# Patient Record
Sex: Female | Born: 1950
Health system: Southern US, Community
[De-identification: ages and names within clinical notes are randomized; demographics above are authoritative.]

## PROBLEM LIST (undated history)

## (undated) DIAGNOSIS — E785 Hyperlipidemia, unspecified: Secondary | ICD-10-CM

## (undated) DIAGNOSIS — E010 Iodine-deficiency related diffuse (endemic) goiter: Secondary | ICD-10-CM

## (undated) DIAGNOSIS — I471 Supraventricular tachycardia, unspecified: Secondary | ICD-10-CM

## (undated) DIAGNOSIS — M1990 Unspecified osteoarthritis, unspecified site: Secondary | ICD-10-CM

## (undated) DIAGNOSIS — R112 Nausea with vomiting, unspecified: Secondary | ICD-10-CM

## (undated) DIAGNOSIS — R0602 Shortness of breath: Secondary | ICD-10-CM

## (undated) DIAGNOSIS — K5190 Ulcerative colitis, unspecified, without complications: Secondary | ICD-10-CM

## (undated) DIAGNOSIS — T884XXA Failed or difficult intubation, initial encounter: Secondary | ICD-10-CM

## (undated) DIAGNOSIS — R0789 Other chest pain: Secondary | ICD-10-CM

## (undated) DIAGNOSIS — Z9889 Other specified postprocedural states: Secondary | ICD-10-CM

## (undated) DIAGNOSIS — M797 Fibromyalgia: Secondary | ICD-10-CM

## (undated) DIAGNOSIS — E119 Type 2 diabetes mellitus without complications: Secondary | ICD-10-CM

## (undated) DIAGNOSIS — G43909 Migraine, unspecified, not intractable, without status migrainosus: Secondary | ICD-10-CM

## (undated) DIAGNOSIS — I10 Essential (primary) hypertension: Secondary | ICD-10-CM

## (undated) DIAGNOSIS — F329 Major depressive disorder, single episode, unspecified: Secondary | ICD-10-CM

## (undated) DIAGNOSIS — F32A Depression, unspecified: Secondary | ICD-10-CM

## (undated) DIAGNOSIS — I499 Cardiac arrhythmia, unspecified: Secondary | ICD-10-CM

## (undated) DIAGNOSIS — E079 Disorder of thyroid, unspecified: Secondary | ICD-10-CM

## (undated) DIAGNOSIS — F419 Anxiety disorder, unspecified: Secondary | ICD-10-CM

## (undated) DIAGNOSIS — E049 Nontoxic goiter, unspecified: Secondary | ICD-10-CM

## (undated) HISTORY — DX: Nontoxic goiter, unspecified: E04.9

## (undated) HISTORY — PX: APPENDECTOMY: SHX54

## (undated) HISTORY — DX: Depression, unspecified: F32.A

## (undated) HISTORY — DX: Type 2 diabetes mellitus without complications: E11.9

## (undated) HISTORY — PX: ACHILLES TENDON REPAIR: SUR1153

## (undated) HISTORY — DX: Major depressive disorder, single episode, unspecified: F32.9

## (undated) HISTORY — DX: Other chest pain: R07.89

## (undated) HISTORY — DX: Unspecified osteoarthritis, unspecified site: M19.90

## (undated) HISTORY — PX: NOSE SURGERY: SHX723

## (undated) HISTORY — PX: FOOT SURGERY: SHX648

## (undated) HISTORY — DX: Supraventricular tachycardia, unspecified: I47.10

## (undated) HISTORY — DX: Shortness of breath: R06.02

## (undated) HISTORY — PX: TOTAL KNEE ARTHROPLASTY: SHX125

## (undated) HISTORY — PX: JOINT REPLACEMENT: SHX530

## (undated) HISTORY — PX: CHOLECYSTECTOMY: SHX55

## (undated) HISTORY — DX: Essential (primary) hypertension: I10

## (undated) HISTORY — PX: TONSILLECTOMY: SUR1361

## (undated) HISTORY — DX: Migraine, unspecified, not intractable, without status migrainosus: G43.909

## (undated) HISTORY — DX: Ulcerative colitis, unspecified, without complications: K51.90

## (undated) HISTORY — DX: Disorder of thyroid, unspecified: E07.9

## (undated) HISTORY — PX: ABDOMINAL HYSTERECTOMY: SHX81

## (undated) HISTORY — DX: Supraventricular tachycardia: I47.1

## (undated) HISTORY — DX: Hyperlipidemia, unspecified: E78.5

---

## 1997-11-13 ENCOUNTER — Other Ambulatory Visit: Admission: RE | Admit: 1997-11-13 | Discharge: 1997-11-13 | Payer: Self-pay | Admitting: Family Medicine

## 1997-11-15 ENCOUNTER — Other Ambulatory Visit: Admission: RE | Admit: 1997-11-15 | Discharge: 1997-11-15 | Payer: Self-pay | Admitting: Family Medicine

## 1997-11-17 ENCOUNTER — Other Ambulatory Visit: Admission: RE | Admit: 1997-11-17 | Discharge: 1997-11-17 | Payer: Self-pay | Admitting: Family Medicine

## 1997-11-29 ENCOUNTER — Other Ambulatory Visit: Admission: RE | Admit: 1997-11-29 | Discharge: 1997-11-29 | Payer: Self-pay | Admitting: Obstetrics & Gynecology

## 1998-01-17 ENCOUNTER — Ambulatory Visit (HOSPITAL_COMMUNITY): Admission: RE | Admit: 1998-01-17 | Discharge: 1998-01-17 | Payer: Self-pay | Admitting: Family Medicine

## 1998-03-14 ENCOUNTER — Ambulatory Visit (HOSPITAL_COMMUNITY): Admission: RE | Admit: 1998-03-14 | Discharge: 1998-03-14 | Payer: Self-pay | Admitting: Urology

## 1998-03-15 ENCOUNTER — Ambulatory Visit (HOSPITAL_COMMUNITY): Admission: RE | Admit: 1998-03-15 | Discharge: 1998-03-15 | Payer: Self-pay | Admitting: Urology

## 1998-09-04 ENCOUNTER — Encounter: Payer: Self-pay | Admitting: Urology

## 1998-09-10 ENCOUNTER — Ambulatory Visit (HOSPITAL_COMMUNITY): Admission: RE | Admit: 1998-09-10 | Discharge: 1998-09-10 | Payer: Self-pay | Admitting: Urology

## 1999-02-24 ENCOUNTER — Other Ambulatory Visit: Admission: RE | Admit: 1999-02-24 | Discharge: 1999-02-24 | Payer: Self-pay | Admitting: Obstetrics and Gynecology

## 1999-03-11 ENCOUNTER — Encounter: Payer: Self-pay | Admitting: Urology

## 1999-03-14 ENCOUNTER — Ambulatory Visit (HOSPITAL_COMMUNITY): Admission: RE | Admit: 1999-03-14 | Discharge: 1999-03-14 | Payer: Self-pay | Admitting: Urology

## 1999-10-03 ENCOUNTER — Ambulatory Visit (HOSPITAL_COMMUNITY): Admission: RE | Admit: 1999-10-03 | Discharge: 1999-10-03 | Payer: Self-pay | Admitting: Urology

## 1999-11-22 ENCOUNTER — Emergency Department (HOSPITAL_COMMUNITY): Admission: EM | Admit: 1999-11-22 | Discharge: 1999-11-22 | Payer: Self-pay | Admitting: Emergency Medicine

## 2000-02-11 HISTORY — PX: DOPPLER ECHOCARDIOGRAPHY: SHX263

## 2001-04-22 ENCOUNTER — Other Ambulatory Visit: Admission: RE | Admit: 2001-04-22 | Discharge: 2001-04-22 | Payer: Self-pay | Admitting: Obstetrics and Gynecology

## 2001-09-23 ENCOUNTER — Emergency Department (HOSPITAL_COMMUNITY): Admission: EM | Admit: 2001-09-23 | Discharge: 2001-09-23 | Payer: Self-pay | Admitting: Emergency Medicine

## 2001-09-23 ENCOUNTER — Encounter: Payer: Self-pay | Admitting: Emergency Medicine

## 2002-04-25 ENCOUNTER — Other Ambulatory Visit: Admission: RE | Admit: 2002-04-25 | Discharge: 2002-04-25 | Payer: Self-pay | Admitting: Obstetrics and Gynecology

## 2003-07-17 ENCOUNTER — Ambulatory Visit (HOSPITAL_COMMUNITY): Admission: RE | Admit: 2003-07-17 | Discharge: 2003-07-17 | Payer: Self-pay | Admitting: Urology

## 2003-07-17 ENCOUNTER — Ambulatory Visit (HOSPITAL_BASED_OUTPATIENT_CLINIC_OR_DEPARTMENT_OTHER): Admission: RE | Admit: 2003-07-17 | Discharge: 2003-07-17 | Payer: Self-pay | Admitting: Urology

## 2003-11-05 HISTORY — PX: CARDIOVASCULAR STRESS TEST: SHX262

## 2004-03-28 ENCOUNTER — Ambulatory Visit (HOSPITAL_COMMUNITY): Admission: RE | Admit: 2004-03-28 | Discharge: 2004-03-28 | Payer: Self-pay | Admitting: Urology

## 2004-03-28 ENCOUNTER — Ambulatory Visit (HOSPITAL_BASED_OUTPATIENT_CLINIC_OR_DEPARTMENT_OTHER): Admission: RE | Admit: 2004-03-28 | Discharge: 2004-03-28 | Payer: Self-pay | Admitting: Urology

## 2004-04-11 ENCOUNTER — Ambulatory Visit (HOSPITAL_BASED_OUTPATIENT_CLINIC_OR_DEPARTMENT_OTHER): Admission: RE | Admit: 2004-04-11 | Discharge: 2004-04-11 | Payer: Self-pay | Admitting: Urology

## 2004-04-11 ENCOUNTER — Ambulatory Visit (HOSPITAL_COMMUNITY): Admission: RE | Admit: 2004-04-11 | Discharge: 2004-04-11 | Payer: Self-pay | Admitting: Urology

## 2004-06-05 ENCOUNTER — Emergency Department (HOSPITAL_COMMUNITY): Admission: EM | Admit: 2004-06-05 | Discharge: 2004-06-05 | Payer: Self-pay | Admitting: Emergency Medicine

## 2004-08-08 ENCOUNTER — Ambulatory Visit (HOSPITAL_BASED_OUTPATIENT_CLINIC_OR_DEPARTMENT_OTHER): Admission: RE | Admit: 2004-08-08 | Discharge: 2004-08-08 | Payer: Self-pay | Admitting: Urology

## 2004-08-08 ENCOUNTER — Ambulatory Visit (HOSPITAL_COMMUNITY): Admission: RE | Admit: 2004-08-08 | Discharge: 2004-08-08 | Payer: Self-pay | Admitting: Urology

## 2008-02-13 ENCOUNTER — Ambulatory Visit: Payer: Self-pay | Admitting: Gastroenterology

## 2008-02-24 ENCOUNTER — Ambulatory Visit: Payer: Self-pay | Admitting: Gastroenterology

## 2008-02-24 ENCOUNTER — Encounter: Payer: Self-pay | Admitting: Gastroenterology

## 2008-02-28 ENCOUNTER — Encounter: Payer: Self-pay | Admitting: Gastroenterology

## 2008-03-26 DIAGNOSIS — N3090 Cystitis, unspecified without hematuria: Secondary | ICD-10-CM | POA: Insufficient documentation

## 2008-03-26 DIAGNOSIS — I471 Supraventricular tachycardia, unspecified: Secondary | ICD-10-CM | POA: Insufficient documentation

## 2008-03-26 DIAGNOSIS — E785 Hyperlipidemia, unspecified: Secondary | ICD-10-CM | POA: Insufficient documentation

## 2008-03-26 DIAGNOSIS — M1990 Unspecified osteoarthritis, unspecified site: Secondary | ICD-10-CM | POA: Insufficient documentation

## 2008-03-26 DIAGNOSIS — K921 Melena: Secondary | ICD-10-CM | POA: Insufficient documentation

## 2008-03-26 DIAGNOSIS — K5180 Other ulcerative colitis without complications: Secondary | ICD-10-CM | POA: Insufficient documentation

## 2008-03-26 DIAGNOSIS — R197 Diarrhea, unspecified: Secondary | ICD-10-CM | POA: Insufficient documentation

## 2008-03-27 ENCOUNTER — Ambulatory Visit: Payer: Self-pay | Admitting: Gastroenterology

## 2008-03-27 DIAGNOSIS — IMO0001 Reserved for inherently not codable concepts without codable children: Secondary | ICD-10-CM | POA: Insufficient documentation

## 2008-03-27 DIAGNOSIS — Z8719 Personal history of other diseases of the digestive system: Secondary | ICD-10-CM | POA: Insufficient documentation

## 2008-03-27 DIAGNOSIS — N3010 Interstitial cystitis (chronic) without hematuria: Secondary | ICD-10-CM | POA: Insufficient documentation

## 2008-03-27 LAB — CONVERTED CEMR LAB
ALT: 30 units/L (ref 0–35)
AST: 25 units/L (ref 0–37)
Albumin: 4.1 g/dL (ref 3.5–5.2)
Alkaline Phosphatase: 59 units/L (ref 39–117)
BUN: 21 mg/dL (ref 6–23)
Basophils Absolute: 0 10*3/uL (ref 0.0–0.1)
Basophils Relative: 1 % (ref 0.0–3.0)
Bilirubin, Direct: 0.1 mg/dL (ref 0.0–0.3)
CO2: 34 meq/L — ABNORMAL HIGH (ref 19–32)
Calcium: 9.6 mg/dL (ref 8.4–10.5)
Chloride: 106 meq/L (ref 96–112)
Creatinine, Ser: 1.1 mg/dL (ref 0.4–1.2)
Eosinophils Absolute: 0.2 10*3/uL (ref 0.0–0.7)
Eosinophils Relative: 3.3 % (ref 0.0–5.0)
Ferritin: 60.5 ng/mL (ref 10.0–291.0)
Folate: 15.7 ng/mL
GFR calc Af Amer: 66 mL/min
GFR calc non Af Amer: 55 mL/min
Glucose, Bld: 105 mg/dL — ABNORMAL HIGH (ref 70–99)
HCT: 41.6 % (ref 36.0–46.0)
Hemoglobin: 14.1 g/dL (ref 12.0–15.0)
Iron: 95 ug/dL (ref 42–145)
Lymphocytes Relative: 35.7 % (ref 12.0–46.0)
MCHC: 34 g/dL (ref 30.0–36.0)
MCV: 94.3 fL (ref 78.0–100.0)
Monocytes Absolute: 0.5 10*3/uL (ref 0.1–1.0)
Monocytes Relative: 9.7 % (ref 3.0–12.0)
Neutro Abs: 2.3 10*3/uL (ref 1.4–7.7)
Neutrophils Relative %: 50.3 % (ref 43.0–77.0)
Platelets: 284 10*3/uL (ref 150–400)
Potassium: 4.2 meq/L (ref 3.5–5.1)
RBC: 4.41 M/uL (ref 3.87–5.11)
RDW: 13 % (ref 11.5–14.6)
Rhuematoid fact SerPl-aCnc: <20 intl units/mL — ABNORMAL LOW (ref 0.0–20.0)
Saturation Ratios: 23.7 % (ref 20.0–50.0)
Sed Rate: 16 mm/hr (ref 0–22)
Sodium: 143 meq/L (ref 135–145)
TSH: 0.79 microintl units/mL (ref 0.35–5.50)
Tissue Transglutaminase Ab, IgA: 0.2 units (ref <7)
Total Bilirubin: 0.9 mg/dL (ref 0.3–1.2)
Total Protein: 7 g/dL (ref 6.0–8.3)
Transferrin: 286.1 mg/dL (ref >=212.0)
Vit D, 1,25-Dihydroxy: 44 (ref 30–89)
Vitamin B-12: 533 pg/mL (ref 211–911)
WBC: 4.7 10*3/uL (ref 4.5–10.5)

## 2008-03-29 ENCOUNTER — Encounter: Payer: Self-pay | Admitting: Gastroenterology

## 2008-04-06 ENCOUNTER — Telehealth: Payer: Self-pay | Admitting: Gastroenterology

## 2008-06-01 ENCOUNTER — Ambulatory Visit: Payer: Self-pay | Admitting: Gastroenterology

## 2008-08-14 ENCOUNTER — Ambulatory Visit: Payer: Self-pay | Admitting: Gastroenterology

## 2009-04-15 ENCOUNTER — Telehealth: Payer: Self-pay | Admitting: Gastroenterology

## 2009-05-06 ENCOUNTER — Encounter: Payer: Self-pay | Admitting: Gastroenterology

## 2009-08-21 ENCOUNTER — Encounter: Admission: RE | Admit: 2009-08-21 | Discharge: 2009-08-21 | Payer: Self-pay | Admitting: Neurology

## 2010-04-24 ENCOUNTER — Ambulatory Visit (HOSPITAL_BASED_OUTPATIENT_CLINIC_OR_DEPARTMENT_OTHER): Admission: RE | Admit: 2010-04-24 | Discharge: 2010-04-24 | Payer: Self-pay | Admitting: *Deleted

## 2010-08-14 NOTE — Progress Notes (Signed)
Summary: results  Phone Note Call from Patient Call back at [phone]   Caller: Patient Call For: patterson Reason for Call: Lab or Test Results Summary of Call: lab results from two wks ago. Initial call taken by: Tawni Levy,  April 06, 2008 10:57 AM  Follow-up for Phone Call        pt advised all wnl and she wanted a copy faxed to her, done Follow-up by: Harlow Mares CMA,  April 06, 2008 11:05 AM

## 2010-08-14 NOTE — Assessment & Plan Note (Signed)
Summary: followup after colonoscopy/ulcerative colitis/tcw   History of Present Illness Visit Type: new patient Primary GI MD: Sheryn Bison MD FACP FAGA Primary Provider: Durwin Nora, MD Chief Complaint: Patient here for f/u after colonoscopy.  Patient c/o continued generalized abdominal craming as well as some rectal bleeding with wiping.  Patient states that her bowel movements are always very urgent. History of Present Illness:   [name] was referred for direct screening colonoscopy and had changes of ulcerative colitis and a cecal ulceration. Biopsies suggested possible NSAID damage and her Lodine that she takes for degenerative arthritis in her knees was stopped and she was placed on oral and in a salicylate therapy. Her underlying diarrhea and rectal bleeding of the last 6 months has improved dramatically but she continues with tenesmus and some bright red blood per rectum.  She has an inflammatory arthritis of her knees and tendinitis in her right foot is followed by Dr. Eulah Pont in orthopedics. She's had chronic arthritis problems and I not sure the extent of her arthritis workup. She been intolerant to almost all incidents. In spite of this fact, she denies upper gastrointestinal or hepatobiliary complaints. She does have general malaise he carries a diagnosis of fibromyalgia, interstitial cystitis, migraine headaches, peripheral edema, and possible osteoporosis. She is all right medications including Aldactone, Toprol-XL, Lyrica, Elmiron, Maxalt, tramadol, et Karie Soda.   GI Review of Systems      Denies abdominal pain, acid reflux, belching, bloating, chest pain, dysphagia with liquids, dysphagia with solids, heartburn, loss of appetite, nausea, vomiting, vomiting blood, weight loss, and  weight gain.      Reports diarrhea, hemorrhoids, and  rectal bleeding.     Denies anal fissure, black tarry stools, change in bowel habit, constipation, diverticulosis, fecal incontinence, heme positive  stool, irritable bowel syndrome, jaundice, light color stool, liver problems, and  rectal pain.     Prior Medications Reviewed Using: List Brought by Patient  Updated Prior Medication List: LIALDA 1.2 GM  TBEC (MESALAMINE) take two every a.m. ALDACTONE 50 MG TABS (SPIRONOLACTONE) Take 2 tablets by mouth qam TOPROL XL 50 MG XR24H-TAB (METOPROLOL SUCCINATE) Take 1 tablet by mouth once a day ZETIA 10 MG TABS (EZETIMIBE) Take 1 tablet by mouth once daily LYRICA 75 MG CAPS (PREGABALIN) Take 1 tablet by mouth two times a day ELMIRON 100 MG CAPS (PENTOSAN POLYSULFATE SODIUM) Take 2 tablets by mouth qam MAXALT 10 MG TABS (RIZATRIPTAN BENZOATE) Take as needed for headache TRAMADOL HCL 50 MG TABS (TRAMADOL HCL) Take as needed for pain MULTIVITAMINS  TABS (MULTIPLE VITAMIN) Take 1 tablet by mouth once a day GREEN TEA SLIM  TABS (APPLE CID VN-GRN TEA-BIT OR-CR) Take 2 tablets by mouth qam and 1 tablet qpm POMEGRANATE 250 MG CAPS (POMEGRANATE (PUNICA GRANATUM)) Take 1 tablet by mouth two times a day * TRANS-RESERVATROL 500 MG Take 1 tablet by mouth qam OMEGA-3 1000 MG CAPS (OMEGA-3 FATTY ACIDS) Take 1 tablet by mouth two times a day CALCIUM 500 + D 500-125 MG-UNIT TABS (CALCIUM CARBONATE-VITAMIN D) Take 2 tablets by mouth qam GLUCOSAMINE-CHONDROITIN 500-400 MG CAPS (GLUCOSAMINE-CHONDROITIN) TAke 1 tablet by mouth with each meal  Current Allergies (reviewed today): ! BIAXIN ! AMOXICILLIN ! DOXYCYCLINE ! PENICILLIN  Past Medical History:    Reviewed history from 03/26/2008 and no changes required:       Current Problems:        CARDIAC ARRHYTHMIA (ICD-427.9)       CYSTITIS (ICD-595.9)       OSTEOARTHRITIS (ICD-715.90)  HYPERLIPIDEMIA (ICD-272.4)       HEMATOCHEZIA (ICD-578.1)       DIARRHEA (ICD-787.91)       OTHER ULCERATIVE COLITIS (ICD-556.8)         Past Surgical History:    Reviewed history and no changes required:       Cholecystectomy       Achilles Tendon repair       Left  shoulder surgery       Interstem implant-left hip       hysterectomy-partial       nose surgery   Family History:    Reviewed history and no changes required:       Family History of Breast Cancer: Paternal Aunt       No FH of Colon Cancer:       Family History of Diabetes: Paternal Grandmother, Mother, Maternal Aunt x 2       Family History of Heart Disease: Paternal Grandmother, Paternal Grandfather, Maternal Grandmother, Mother  Social History:    Reviewed history and no changes required:       Occupation: Airline pilot       Patient has never smoked.        Alcohol Use - yes       Daily Caffeine Use       Illicit Drug Use - no       Patient gets regular exercise.   Risk Factors:  Tobacco use:  never Drug use:  no Caffeine use:  3 drinks per day Alcohol use:  yes    Type:  margarita    Drinks per day:  <1    Comments:  drinks rarely Exercise:  yes    Times per week:  6    Type:  weights,cardio   Review of Systems       The patient complains of arthritis/joint pain and fatigue.  The patient denies allergy/sinus, anemia, anxiety-new, back pain, blood in urine, breast changes/lumps, change in vision, confusion, cough, coughing up blood, depression-new, fainting, fever, headaches-new, hearing problems, heart murmur, heart rhythm changes, itching, menstrual pain, muscle pains/cramps, night sweats, nosebleeds, pregnancy symptoms, shortness of breath, skin rash, sleeping problems, sore throat, swelling of feet/legs, swollen lymph glands, thirst - excessive , urination - excessive , urination changes/pain, urine leakage, vision changes, and voice change.     Vital Signs:  Patient Profile:   60 Years Old Female Height:     67 inches Weight:      196.2 pounds BMI:     30.84 BSA:     2.01 Pulse rate:   84 / minute Pulse rhythm:   regular BP sitting:   114 / 68  (right arm)  Vitals Entered By: Hortense Ramal CMA (March 27, 2008 8:41 AM)                  Physical  Exam  General:     Well developed, well nourished, no acute distress.healthy appearing.   Head:     Normocephalic and atraumatic. Eyes:     PERRLA, no icterus.exam deferred to patient's ophthalmologist.   Lungs:     Clear throughout to auscultation. Heart:     Regular rate and rhythm; no murmurs, rubs,  or bruits. Abdomen:     Soft, nontender and nondistended. No masses, hepatosplenomegaly or hernias noted. Normal bowel sounds. Msk:     joint tenderness and joint warmth.  She has bony thickening and decreased range of motion of both knees and has a soft  cast on her left leg. I cannot appreciate any evidence of phlebitis. There is no evidence of swelling or pain and tenderness in other joints that I can appreciate. Extremities:     No clubbing, cyanosis, edema or deformities noted. Neurologic:     Alert and  oriented x4;  grossly normal neurologically. Skin:     Intact without significant lesions or rashes. Psych:     Alert and cooperative. Normal mood and affect.depressed affect.      Impression & Recommendations:  Problem # 1:  OTHER ULCERATIVE COLITIS (ICD-556.8) Assessment: Improved Her inflammatory colitis has improved him in a salicylate therapy. She continues with some tenesmus and I have prescribed Canasa 1 g suppositories at bedtime. Inflammatory bowel disease serologic markers have been ordered and we will try to decide whether she has primary inflammatory bowel disease versus changes from NSAID damage. She is to continue Lialba 2.4 g a day. Orders: IBD Serology ([facility] #1007) [phone]) T-Vitamin D (25-Hydroxy) [phone]) T-Sprue Panel (Celiac Disease Gustavus Bryant) [phone]) TLB-CBC Platelet - w/Differential (85025-CBCD) TLB-BMP (Basic Metabolic Panel-BMET) (80048-METABOL) TLB-Hepatic/Liver Function Pnl (80076-HEPATIC) TLB-TSH (Thyroid Stimulating Hormone) (84443-TSH) TLB-B12, Serum-Total ONLY (08657-Q46) TLB-Ferritin (82728-FER) TLB-Folic Acid (Folate)  (82746-FOL) TLB-IBC Pnl (Iron/FE;Transferrin) (83550-IBC) TLB-Sedimentation Rate (ESR) (85651-ESR) TLB-Rheumatoid Factor (RA) (96295-MW)   Problem # 2:  DIARRHEA (ICD-787.91) Assessment: Improved Celiac antibodies have been ordered along with routine screening parameters, sed rate, and anemia profile. She gives a history of lactose intolerance and uses Lactaid tablets.  Problem # 3:  OSTEOARTHRITIS (ICD-715.90) Assessment: Deteriorated Rheumatoid factor and sedimentation rate have been ordered. Started possibly she has inflammatory arthropathy associated with inflammatory bowel disease. I placed her on Celebrex 200 mg twice a day office followup in 2 weeks' time. She has a variety of functional disorders it may all be that she has chronic IBS exacerbated by NSAID use. She does seem to have significant arthritis and she may need referral for rheumatology evaluation.  Problem # 4:  FIBROMYALGIA (ICD-729.1) Assessment: Unchanged Consider low-dose antidepressant use.   Patient Instructions: 1)  Copy Sent To:Dr. Durwin Nora and Dr. Eulah Pont orthopedics. 2)  Please Continue current medications. 3)  Lactose Intolerance brochure given. 4)  Please schedule a follow-up appointment in 2 weeks 5)  Canasa 1 g suppositories at bedtime and Celebrex 200 mg twice a day.    Prescriptions: CELEBREX 200 MG CAPS (CELECOXIB) one by mouth two times a day  #60 x 3   Entered by:   Harlow Mares CMA   Authorized by:   Mardella Layman MD FACG,FAGA   Signed by:   Harlow Mares CMA on 03/27/2008   Method used:   [facility]        [facility]  [place] [address]       [address]       [place], [place]  [address]       Ph: [phone] or [phone]       Fax: [phone]   RxID:   [id number] CANASA 1000 MG  SUPP (MESALAMINE) one per rectum at bedtime  #30 x 0   Entered by:   Harlow Mares CMA   Authorized by:   Mardella Layman MD FACG,FAGA   Signed by:   Harlow Mares CMA on  03/27/2008   Method used:   [facility]        [facility]  [place] [address] [phone][facility]ail[facility]       [address]  [place], [place]  [address]       Ph: [phone] or [phone]       Fax: [phone]   RxID:   [id number]  ]

## 2010-08-14 NOTE — Letter (Signed)
Summary: Patient Notice- Colon Biospy Results  [facility]  [address] [place], [place] [address]   Phone: [phone]  Fax: [phone]        February 28, 2008 MRN: [id number]    [name] [address] [place], [place]  [address]    Dear [name],  I am pleased to inform you that the biopsies taken during your recent colonoscopy did not show any evidence of cancer upon pathologic examination.Biopsies show NSAID damage only....DRP  Additional information/recommendations:  __No further action is needed at this time.  Please follow-up with      your primary care physician for your other healthcare needs.  __Please call [phone] to schedule a return visit to review      your condition.  xx__Continue with the treatment plan as outlined on the day of your      exam.  __You should have a repeat colonoscopy examination for this problem           in _ years.  Please call us if you are having persistent problems or have questions about your condition that have not been fully answered at this time.  Sincerely,  Mardella Layman MD [facility]  This letter has been electronically signed by your physician.

## 2010-08-14 NOTE — Assessment & Plan Note (Signed)
Summary: FUP/YF   History of Present Illness Visit Type: follow up Primary GI MD: Sheryn Bison MD Faith Rogue Primary Provider: Durwin Nora, MD Requesting Provider: n/a Chief Complaint: Follow-up visit from Colonoscopy and Labs colon was done in Aug 09 and labs in September History of Present Illness:   [name] is greatly improved symptomatically has been longer having cramping, diarrhea, rectal bleeding. Since she was last seen she said injection of her knees by her orthopedist and this plus her daily Celebrex has caused marked relief of her arthritis. Of her lab tests were unremarkable including rheumatoid factor inflammatory bowel disease markers. She takes Lialba 2.4 g a day with p.r.n. Canasa suppositories.   GI Review of Systems      Denies abdominal pain, acid reflux, belching, bloating, chest pain, dysphagia with liquids, dysphagia with solids, heartburn, loss of appetite, nausea, vomiting, vomiting blood, weight loss, and  weight gain.        Denies anal fissure, black tarry stools, change in bowel habit, constipation, diarrhea, diverticulosis, fecal incontinence, heme positive stool, hemorrhoids, irritable bowel syndrome, jaundice, light color stool, liver problems, rectal bleeding, and  rectal pain.     Prior Medications Reviewed Using: Patient Recall  Prior Medication List:  LIALDA 1.2 GM  TBEC (MESALAMINE) take two every a.m. ALDACTONE 50 MG TABS (SPIRONOLACTONE) Take 2 tablets by mouth qam TOPROL XL 50 MG XR24H-TAB (METOPROLOL SUCCINATE) Take 1 tablet by mouth once a day ZETIA 10 MG TABS (EZETIMIBE) Take 1 tablet by mouth once daily LYRICA 75 MG CAPS (PREGABALIN) Take 1 tablet by mouth two times a day ELMIRON 100 MG CAPS (PENTOSAN POLYSULFATE SODIUM) Take 2 tablets by mouth qam MAXALT 10 MG TABS (RIZATRIPTAN BENZOATE) Take as needed for headache TRAMADOL HCL 50 MG TABS (TRAMADOL HCL) Take as needed for pain MULTIVITAMINS  TABS (MULTIPLE VITAMIN) Take 1 tablet by mouth  once a day GREEN TEA SLIM  TABS (APPLE CID VN-GRN TEA-BIT OR-CR) Take 2 tablets by mouth qam and 1 tablet qpm POMEGRANATE 250 MG CAPS (POMEGRANATE (PUNICA GRANATUM)) Take 1 tablet by mouth two times a day * TRANS-RESERVATROL 500 MG Take 1 tablet by mouth qam OMEGA-3 1000 MG CAPS (OMEGA-3 FATTY ACIDS) Take 1 tablet by mouth two times a day CALCIUM 500 + D 500-125 MG-UNIT TABS (CALCIUM CARBONATE-VITAMIN D) Take 2 tablets by mouth qam GLUCOSAMINE-CHONDROITIN 500-400 MG CAPS (GLUCOSAMINE-CHONDROITIN) TAke 1 tablet by mouth with each meal CANASA 1000 MG  SUPP (MESALAMINE) one per rectum at bedtime CELEBREX 200 MG CAPS (CELECOXIB) one by mouth two times a day   Current Allergies (reviewed today): ! BIAXIN ! AMOXICILLIN ! DOXYCYCLINE ! PENICILLIN  Past Medical History:    Reviewed history from 03/26/2008 and no changes required:       Current Problems:        CARDIAC ARRHYTHMIA (ICD-427.9)       CYSTITIS (ICD-595.9)       OSTEOARTHRITIS (ICD-715.90)       HYPERLIPIDEMIA (ICD-272.4)       HEMATOCHEZIA (ICD-578.1)       DIARRHEA (ICD-787.91)       OTHER ULCERATIVE COLITIS (ICD-556.8)         Past Surgical History:    Reviewed history from 03/27/2008 and no changes required:       Cholecystectomy       Achilles Tendon repair       Left shoulder surgery       Interstem implant-left hip       hysterectomy-partial  nose surgery   Family History:    Reviewed history from 03/27/2008 and no changes required:       Family History of Breast Cancer: Paternal Aunt       No FH of Colon Cancer:       Family History of Diabetes: Paternal Grandmother, Mother, Maternal Aunt x 2       Family History of Heart Disease: Paternal Grandmother, Paternal Grandfather, Maternal Grandmother, Mother  Social History:    Reviewed history from 03/27/2008 and no changes required:       Occupation: Airline pilot       Patient has never smoked.        Alcohol Use - yes occ       Daily Caffeine Use        Illicit Drug Use - no       Patient gets regular exercise.     Vital Signs:  Patient Profile:   60 Years Old Female Height:     67 inches Weight:      192.50 pounds BMI:     30.26 Pulse rate:   64 / minute Pulse rhythm:   regular BP sitting:   120 / 80  (left arm)  Vitals Entered By: Merri Ray CMA (June 01, 2008 10:37 AM)                  Physical Exam  General:     Well developed, well nourished, no acute distress.healthy appearing.      Impression & Recommendations:  Problem # 1:  OTHER ULCERATIVE COLITIS (ICD-556.8) Assessment: Improved her IBD serologies were negative it appears she probably had an NSAID-induced inflammatory colitis which is resolving on aminosalicylate therapy and removal of Cox one inhibitors. She still has minimal colitis symptoms they'll continue her medications as previously outlined with followup in 3 months time. At her request I gave her copy of all her labs.   Patient Instructions: 1)  Please Continue current medications. 2)  Please schedule a follow-up appointment in 3 months.    ]

## 2010-08-14 NOTE — Miscellaneous (Signed)
Summary: llialda rx  Clinical Lists Changes  Medications: Added new medication of LIALDA 1.2 GM  TBEC (MESALAMINE) take two every a.m. - Signed Rx of LIALDA 1.2 GM  TBEC (MESALAMINE) take two every a.m.;  #62 x 3;  Signed;  Entered by: Greer Ee RN;  Authorized by: Mardella Layman MD FACG,FAGA;  Method used: Electronic    Prescriptions: LIALDA 1.2 GM  TBEC (MESALAMINE) take two every a.m.  #62 x 3   Entered by:   Greer Ee RN   Authorized by:   Mardella Layman MD [facility]   Signed by:   Greer Ee RN on 02/24/2008   Method used:   Electronically [facility] to ...       [facility]  [place] [address]       [address]       [place], [place]  [address]       Ph: [phone] or [phone]       Fax: [phone]   RxID:   [id number]

## 2010-08-14 NOTE — Miscellaneous (Signed)
Summary: LG/PV  Clinical Lists Changes  Medications: Added new medication of MOVIPREP 100 GM  SOLR (PEG-KCL-NACL-NASULF-NA ASC-C) As per prep instructions. - Signed Rx of MOVIPREP 100 GM  SOLR (PEG-KCL-NACL-NASULF-NA ASC-C) As per prep instructions.;  #1 x 0;  Signed;  Entered by: Ezra Sites RN;  Authorized by: Mardella Layman MD [facility];  Method used: Electronic Allergies: Added new allergy or adverse reaction of BIAXIN Added new allergy or adverse reaction of AMOXICILLIN Added new allergy or adverse reaction of DOXYCYCLINE Added new allergy or adverse reaction of PENICILLIN Observations: Added new observation of NKA: F (02/13/2008 16:45)    Prescriptions: MOVIPREP 100 GM  SOLR (PEG-KCL-NACL-NASULF-NA ASC-C) As per prep instructions.  #1 x 0   Entered by:   Ezra Sites RN   Authorized by:   Mardella Layman MD [facility]   Signed by:   Ezra Sites RN on 02/13/2008   Method used:   Electronically sent to ...       [facility]  [place] [address]       [address]       [place], [place]  [address]       Ph: [phone] or [phone]       Fax: [phone]   RxID:   [id number]

## 2010-08-14 NOTE — Assessment & Plan Note (Signed)
Summary: RECALL REV/FH   History of Present Illness Visit Type: follow up Primary GI MD: Sheryn Bison MD FACP FAGA Primary Provider: Durwin Nora, MD Requesting Provider: n/a Chief Complaint: colitis History of Present Illness:   Kayler is doing well with her left-sided ulcerative colitis and is having 2-3  bowel movements a day with occasional bright red blood per rectum. She denies abdominal pain or systemic complaints.She Takes Lialba 2.4 g a day and uses p.r.n. can also suppositories. Arthritis continues to be a problem despite taking Celebrex 2 mg twice a day. Inflammatory bowel disease serologic markers were obtained and were negative. She is on multiple medications listed and reviewed her chart. Her primary care physician Dr. Holley Bouche.   GI Review of Systems      Denies abdominal pain, acid reflux, belching, bloating, chest pain, dysphagia with liquids, dysphagia with solids, heartburn, loss of appetite, nausea, vomiting, vomiting blood, weight loss, and  weight gain.      Reports rectal pain.     Denies anal fissure, black tarry stools, change in bowel habit, constipation, diarrhea, diverticulosis, fecal incontinence, heme positive stool, hemorrhoids, irritable bowel syndrome, jaundice, light color stool, liver problems, and  rectal bleeding.   Prior Medications Reviewed Using: List Brought by Patient  Updated Prior Medication List: LIALDA 1.2 GM  TBEC (MESALAMINE) take two every a.m. ALDACTONE 50 MG TABS (SPIRONOLACTONE) Take 2 tablets by mouth qam TOPROL XL 50 MG XR24H-TAB (METOPROLOL SUCCINATE) Take 1 tablet by mouth once a day ZETIA 10 MG TABS (EZETIMIBE) Take 1 tablet by mouth once daily LYRICA 75 MG CAPS (PREGABALIN) Take 1 tablet by mouth two times a day ELMIRON 100 MG CAPS (PENTOSAN POLYSULFATE SODIUM) Take 2 tablets by mouth qam MAXALT 10 MG TABS (RIZATRIPTAN BENZOATE) Take as needed for headache TRAMADOL HCL 50 MG TABS (TRAMADOL HCL) Take as needed for  pain MULTIVITAMINS  TABS (MULTIPLE VITAMIN) Take 1 tablet by mouth once a day GREEN TEA SLIM  TABS (APPLE CID VN-GRN TEA-BIT OR-CR) Take 2 tablets by mouth qam and 1 tablet qpm POMEGRANATE 250 MG CAPS (POMEGRANATE (PUNICA GRANATUM)) Take 1 tablet by mouth two times a day * TRANS-RESERVATROL 500 MG Take 1 tablet by mouth qam OMEGA-3 1000 MG CAPS (OMEGA-3 FATTY ACIDS) Take 1 tablet by mouth two times a day CALCIUM 500 + D 500-125 MG-UNIT TABS (CALCIUM CARBONATE-VITAMIN D) Take 2 tablets by mouth qam GLUCOSAMINE-CHONDROITIN 500-400 MG CAPS (GLUCOSAMINE-CHONDROITIN) TAke 1 tablet by mouth with each meal CELEBREX 200 MG CAPS (CELECOXIB) one by mouth two times a day SAM-E COMPLETE 400 MG TBEC (S-ADENOSYLMETHIONINE) Take 2 daily  every morning ALIGN  CAPS (MISC INTESTINAL FLORA REGULAT) once daily  Current Allergies (reviewed today): ! BIAXIN ! AMOXICILLIN ! DOXYCYCLINE ! PENICILLIN Past Medical History:    Reviewed history from 03/26/2008 and no changes required:       Current Problems:        CARDIAC ARRHYTHMIA (ICD-427.9)       CYSTITIS (ICD-595.9)       OSTEOARTHRITIS (ICD-715.90)       HYPERLIPIDEMIA (ICD-272.4)       HEMATOCHEZIA (ICD-578.1)       DIARRHEA (ICD-787.91)       OTHER ULCERATIVE COLITIS (ICD-556.8)         Past Surgical History:    Reviewed history from 03/27/2008 and no changes required:       Cholecystectomy       Achilles Tendon repair       Left shoulder surgery  Interstem implant-left hip       hysterectomy-partial       nose surgery   Family History:    Reviewed history from 03/27/2008 and no changes required:       Family History of Breast Cancer: Paternal Aunt       No FH of Colon Cancer:       Family History of Diabetes: Paternal Grandmother, Mother, Maternal Aunt x 2       Family History of Heart Disease: Paternal Grandmother, Paternal Grandfather, Maternal Grandmother, Mother  Social History:    Reviewed history from 06/01/2008 and no changes  required:       Occupation: Airline pilot       Patient has never smoked.        Alcohol Use - yes occ       Daily Caffeine Use       Illicit Drug Use - no       Patient gets regular exercise.  Risk Factors:  Seatbelt use:  100 %  Vital Signs:  Patient Profile:   60 Years Old Female Height:     67 inches Weight:      197.25 pounds BMI:     31.01 Pulse rate:   68 / minute Pulse rhythm:   regular BP sitting:   120 / 80  (left arm)  Vitals Entered By: June McMurray CMA (August 14, 2008 3:50 PM)                  Physical Exam  General:     Well developed, well nourished, no acute distress.healthy appearing.   Head:     Normocephalic and atraumatic. Eyes:     PERRLA, no icterus.exam deferred to patient's ophthalmologist.   Abdomen:     Soft, nontender and nondistended. No masses, hepatosplenomegaly or hernias noted. Normal bowel sounds. Rectal:     Normal exam.External hemorrhoids are visible without fissures or fistulae. No rectal masses or tenderness and what stool is present is guaiac negative. Extremities:     No clubbing, cyanosis, edema or deformities noted. Neurologic:     Alert and  oriented x4;  grossly normal neurologically. Psych:     Alert and cooperative. Normal mood and affect.   Impression & Recommendations:  Problem # 1:  OTHER ULCERATIVE COLITIS (ICD-556.8) Assessment: Improved Continue the other 2.4 g a day p.r.n.Canasa 1 g suppositories.  Problem # 2:  HEMATOCHEZIA (ICD-578.1) Assessment: Unchanged this is hemorrhoidal bleeding and I prescribed p.r.n. Anal-pram cream locally.  Problem # 3:  FIBROMYALGIA (ICD-729.1) Assessment: Unchanged   Patient Instructions: 1)  Copy Sent To:Dr. Holley Bouche 2)  Please Continue current medications. 3)  Hemorrhoids brochure given. 4)  Please schedule a follow-up appointment in 4 months. 5)  Local anal hemorrhoid management  Appended Document: RECALL REV/FH    Clinical Lists  Changes  Medications: Added new medication of CANASA 1000 MG  SUPP (MESALAMINE) insert one into rectum at bedtime - Signed Added new medication of HYDROCORTISONE ACE-PRAMOXINE 2.5-1 % CREA (HYDROCORTISONE ACE-PRAMOXINE) Apply to rectum 2-3 times per day - Signed Rx of CANASA 1000 MG  SUPP (MESALAMINE) insert one into rectum at bedtime;  #30 x 0;  Signed;  Entered by: Harlow Mares CMA;  Authorized by: Mardella Layman MD [facility];  Method used: [facility]  [place] [address] [place], [place], [place]  [address], Ph: [phone] or [phone], Fax: [phone] Rx of HYDROCORTISONE ACE-PRAMOXINE 2.5-1 % CREA (HYDROCORTISONE ACE-PRAMOXINE) Apply  to rectum 2-3 times per day;  #1 tube x 0;  Signed;  Entered by: Harlow Mares CMA;  Authorized by: Mardella Layman MD [facility];  Method used: [facility]  [place] [address] [place], [place], [place]  [address], Ph: [phone] or [phone], Fax: [phone]    Prescriptions: HYDROCORTISONE ACE-PRAMOXINE 2.5-1 % CREA (HYDROCORTISONE ACE-PRAMOXINE) Apply to rectum 2-3 times per day  #1 tube x 0   Entered by:   Harlow Mares CMA   Authorized by:   Mardella Layman MD FACG,FAGA   Signed by:   Harlow Mares CMA on 08/14/2008   Method used:   [facility]  [place] [address])       [address]       [place], [place]  [address]       Ph: [phone] or [phone]       Fax: [phone]   RxID:   [id number] CANASA 1000 MG  SUPP (MESALAMINE) insert one into rectum at bedtime  #30 x 0   Entered by:   Harlow Mares CMA   Authorized by:   Mardella Layman MD FACG,FAGA   Signed by:   Harlow Mares CMA on 08/14/2008   Method used:   [facility]        [facility]  [place] [address] [facility]ail[facility]       [address]       [place], [place]  [address]       Ph: [phone] or [phone]       Fax: [phone]   RxID:   [id number]

## 2010-08-14 NOTE — Procedures (Signed)
Summary: Colonoscopy   Colonoscopy  Procedure date:  02/24/2008  Findings:      Location:  [facility].    Procedures Next Due Date:    Colonoscopy: 02/2018  Patient Name: [name], [name] MRN:  Procedure Procedures: Colonoscopy CPT: 16109.    with biopsy. CPT: Q5068410.  Personnel: Endoscopist: Vania Rea. Jarold Motto, MD.  Referred By: Huel Cote, MD.  Exam Location: Exam performed in Outpatient Clinic. Outpatient  Patient Consent: Procedure, Alternatives, Risks and Benefits discussed, consent obtained, from patient. Consent was obtained by the RN.  Indications Symptoms: Diarrhea Hematochezia.  History  Current Medications: Patient is not currently taking Coumadin.  Medical/ Surgical History: Hyperlipidemia, Osteoarthritis, Cystitis, Arrhythmia,  Pre-Exam Physical: Performed Feb 24, 2008. Cardio-pulmonary exam, Rectal exam, Abdominal exam, Extremity exam, Mental status exam WNL.  Comments: Pt. history reviewed/updated, physical exam performed prior to initiation of sedation? YES Exam Exam: Extent of exam reached: Terminal Ileum, extent intended: Cecum.  The cecum was identified by appendiceal orifice and IC valve. Patient position: on left side. Time to Cecum: 00:04:01. Time for Withdrawl: 00:08:29. Colon retroflexion performed. Images taken. ASA Classification: II. Tolerance: excellent.  Monitoring: Pulse and BP monitoring, Oximetry used. Supplemental O2 given. at 2 Liters.  Colon Prep Used Golytely for colon prep. Prep results: good.  Sedation Meds: Patient assessed and found to be appropriate for moderate (conscious) sedation. Fentanyl 75 mcg. given IV. Versed 10 mg. given IV.  Instrument(s): CF 140L. Serial [id number].  Findings - MUCOSAL ABNORMALITY: Descending Colon to Rectum. Erosions present, Pseudo polyps absent, Granularity present, bleeding present, ulcers absent, Friability: mild. Activity level mild, Biopsy/Mucosal Abn. taken.  ICD9: Ulcerative Colitis, Other: 556.8.  - SOLITARY ULCER: Cecum. Biopsy/Solitary Ulcer taken. ICD9: .  - NORMAL EXAM: Cecum to Descending Colon. Not Seen: Polyps. AVM's. Tumors. Diverticulosis.    Comments: NO POLYPS NOTED.... Assessment  Diagnoses: .  556.8: Ulcerative Colitis, Other. NSAID DAMAGE ****.   Events  Unplanned Interventions: No intervention was required.  Plans Medication Plan: Await pathology. 5-ASA: LIALDA 2.4GM QAM, starting Feb 24, 2008 for 4 wks.   Comments: STOP LODINE AND ALL NSAID'S Disposition: After procedure patient sent to recovery. After recovery patient sent home.  Scheduling/Referral: Follow-Up prn. Clinic Visit, to Vania Rea. Jarold Motto, MD, around Jan 24, 2008.  This report was created from the original endoscopy report, which was reviewed and signed by the above listed endoscopist.  REPORT OF SURGICAL PATHOLOGY   Case #: [id number] Patient Name: Rachael, Gomez Office Chart Number:  N/A   MRN: [id number] Pathologist: Alden Server A. Delila Spence, MD DOB/Age  Mar 03, 1951 (Age: 60)    Gender: F Date Taken:  02/24/2008 Date Received: 02/24/2008   FINAL DIAGNOSIS   ***MICROSCOPIC EXAMINATION AND DIAGNOSIS***   1.   COLON, CECUM, BIOPSIES:    -  FRAGMENTS OF COLONIC MUCOSA WITH ULCERATION, CRYPT ARCHITECTURE DISTORTION  AND ACTIVE MUCOSAL COLITIS   2.  COLON, SIGMOID, BIOPSIES:  ONE FRAGMENT OF ULCERATED MUCOSA WITH INFLAMED GRANULATION TISSUE AND FRAGMENTS OF BENIGN COLONIC MUCOSA WITH NO ACTIVE MUCOSAL INFLAMMATION, GRANULOMAS OR DYSPLASIA.      COMMENT 1,2.  Although the ulceration noted in both the biopsies may be related to NSAIDS related mucosal injury, the presence of crypt architectural distortion and branching of crypts with associated active cryptitis are features that are more often seen in Crohn' s disease.  No granulomas or dysplasia are identified.  Clinical and endoscopic correlation would be helpful.  (EAA:kv 02-27-08)     mw Date Reported:  02/27/2008     Alden Server A. Delila Spence, MD *** Electronically Signed Out By EAA ***   Clinical information Cecal ulcer possible.  NSAIDS damage vs Crohn' s disease (mj)   specimen(s) obtained 1: Colon, biopsy, cecum 2: Colon, biopsy, sigmoid   Gross Description 1.  Received in formalin are tan, soft tissue fragments that are submitted in toto.  Number:  Three            Size:  0.2 to 0.3 cm    2.  Received in formalin are four fragments of soft tan to focally dark red tissue that vary from 0.2 to 0.4 cm.  Block Summary:  Toto one cassette (BM:mj 02/24/08)   mj/     Signed by Mardella Layman MD FACG,FAGA on 02/28/2008 at 5:55 PM  ________________________________________________________________________ noted   Signed by Mardella Layman MD FACG,FAGA on 02/28/2008 at 5:55 PM  ________________________________________________________________________  cc:  Huel Cote, MD   February 28, 2008 MRN: [id number]    [name] [phone] [place], [place]  [address]    Dear [name],  I am pleased to inform you that the biopsies taken during your recent colonoscopy did not show any evidence of cancer upon pathologic examination.Biopsies show NSAID damage only....DRP  Additional information/recommendations:  __No further action is needed at this time.  Please follow-up with      your primary care physician for your other healthcare needs.  __Please call [phone] to schedule a return visit to review      your condition.  xx__Continue with the treatment plan as outlined on the day of your      exam.  __You should have a repeat colonoscopy examination for this problem           in _ years.  Please call us if you are having persistent problems or have questions about your condition that have not been fully answered at this time.  Sincerely,  Mardella Layman MD [facility]  This letter has been electronically signed by your  physician.   Signed by Mardella Layman MD FACG,FAGA on 02/28/2008 at 5:57 PM  ________________________________________________________________________

## 2010-08-14 NOTE — Progress Notes (Signed)
Summary: rectal bleeding  Phone Note Call from Patient Call back at [phone]   Caller: Patient Call For: Dr. Jarold Motto Reason for Call: Talk to Nurse Summary of Call: pt having blood in her stool even after switching to Celebrex per Dr. Norval Gable orders... pt's joints are hurting and she is still having gastro problems... should she switch meds again or see a rhumatoid specialist Initial call taken by: Vallarie Mare,  April 15, 2009 11:40 AM  Follow-up for Phone Call        CAN RESTART CELEBREX AND SCHEDULE WITH DR. Kellie Simmering ,,, Follow-up by: Mardella Layman MD [facility],  April 15, 2009 2:46 PM  Additional Follow-up for Phone Call Additional follow up Details #1::        Attempted to call Dr. Demetria Pore for referrral.  Office is closed whis week.  Left message for pt that it will be next week before we can make referral.  Ashok Cordia RN  April 16, 2009 10:38 AM    Additional Follow-up for Phone Call Additional follow up Details #2::    Appt sch with Dr. Kellie Simmering for 05/06/09.  Dr. Ines Bloomer office will notify pt.  Records faxed. Ashok Cordia RN  April 23, 2009 8:27 AM

## 2010-08-15 NOTE — Letter (Signed)
Summary: Pain all over body/Dr.William Nathaneil Canary, M.D.  Pain all over body/Dr.William Nathaneil Canary, M.D.   Imported By: Sherian Rein 05/29/2009 07:59:41  _____________________________________________________________________  External Attachment:    Type:   Image     Comment:   External Document

## 2010-09-25 LAB — POCT I-STAT 4, (NA,K, GLUC, HGB,HCT)
Glucose, Bld: 112 mg/dL — ABNORMAL HIGH (ref 70–99)
HCT: 41 % (ref 36.0–46.0)
Hemoglobin: 13.9 g/dL (ref 12.0–15.0)
Potassium: 4.3 mEq/L (ref 3.5–5.1)
Sodium: 141 mEq/L (ref 135–145)

## 2010-11-28 NOTE — Op Note (Signed)
NAME:  [name], [name]                       ACCOUNT NO.:  [id number]   MEDICAL RECORD NO.:  [id number]                   PATIENT TYPE:  AMB   LOCATION:  [facility]                                 FACILITY:  [facility]   PHYSICIAN:  Jamison Neighbor, M.D.               DATE OF BIRTH:  11/07/1950   DATE OF PROCEDURE:  07/17/2003  DATE OF DISCHARGE:                                 OPERATIVE REPORT   PREOPERATIVE DIAGNOSES:  1. Pelvic floor dysfunction.  2. Interstitial cystitis.   POSTOPERATIVE DIAGNOSES:  1. Pelvic floor dysfunction.  2. Interstitial cystitis.   OPERATION/PROCEDURE:  1. Cystoscopy.  2. Urethral calibration.  3. Hydrodistention of the bladder.  4. Marcaine and Pyridium instillation.  5. Marcaine and Kenalog injection.   SURGEON:  Jamison Neighbor, M.D.   ANESTHESIA:  General.   COMPLICATIONS:  None.   DRAINS:  None.   BRIEF HISTORY:  This 60 year old female is felt to have interstitial  cystitis.  However, she does not have diminished bladder capacity.  She has  the classic symptoms of urgency, frequency and pain but relatively  unremarkable bladder on cystoscopy.  We do note, however, that the patient  has responded beautifully to hydrodistention in the past.   She has definite evidence of pelvic floor dysfunction with associated  detrusor sphincter dyssynergy.  Recent urodynamic evaluation showed a  sawtooth pattern with abdominal straining and obstruction but there was no  evidence of true mechanical obstruction as she has an unremarkable urethra.  The patient has been given Valium as well as alpha blockers to try and help  with her voiding, but there has only been modest improvement.  She requests  that her hydrodistention be performed to see if she will have any resolution  of her symptoms.  She does know that going forward, if this does not work,  then we will have to consider either pelvic floor rehabilitation or  InterStim as a way of stopping her  dyssynergic voiding.  She understands the  risks and benefits to the procedure and gave informed consent.   DESCRIPTION OF PROCEDURE:  After successful induction of general anesthesia,  the patient was placed in the dorsal lithotomy position, prepped with  Betadine and draped in the usual sterile fashion.  Careful bimanual  examination revealed no significant abnormalities of the pelvis.  There was  no cystocele, rectocele or enterocele to speak of.  The urethra was palpably  normal.  No signs of stenosis or stricture and there was no evidence of a  diverticula.  The urethra was calibrated to 32-French with female urethral  sounds with no obstruction noted.   The cystoscope was inserted.  The bladder was carefully inspected.  It was  free of any tumor or stone.  Both ureteral orifices were normal in  configuration and location.  The bladder was distended at a pressure of 100  cm of water for five minutes.  When the bladder was drained, the patient had  no significant glomerulation formation.  It was very modest and the patient  did have a normal bladder capacity of 1400 mL.  It is felt that the patient  certainly had significant improvement in her capacity as well as the  appearance of her bladder due to her long-term Elmiron use.  The patient was  given a mixture of Marcaine and Pyridium.  Marcaine and Kenalog were  injected periurethrally.  The patient given intraoperative B&O suppository  as well as Toradol and Zofran.   She will be sent home with Lorcet Plus, Pyridium Plus and Levaquin.  When we  bring her back, if she is not improved with the combination of  hydrodistention and combined alpha blockade and Valium.  We will talk to her  further about InterStim and/or physical therapy.                                               Jamison Neighbor, M.D.    RJE/MEDQ  D:  07/17/2003  T:  07/17/2003  Job:  [id number]

## 2010-11-28 NOTE — Op Note (Signed)
[name], [name]:  [id number]   MEDICAL RECORD NO.:  [id number]          PATIENT TYPE:  AMB   LOCATION:  [facility]                         [facility]AC[facility]   PHYSICIAN:  Jamison Neighbor, M.D.  DATE OF BIRTH:  Apr 29, 1951   DATE OF PROCEDURE:  04/11/2004  DATE OF DISCHARGE:                                 OPERATIVE REPORT   SERVICE:  Urology.   PREOPERATIVE DIAGNOSES:  1.  Urgency incontinence.  2.  Intermittent urinary retention secondary to pelvic floor dysfunction.   POSTOPERATIVE DIAGNOSES:  1.  Urgency incontinence.  2.  Intermittent urinary retention secondary to pelvic floor dysfunction.   PROCEDURE:  Second stage Interstim implantation.   SURGEON:  Jamison Neighbor, M.D.   ANESTHESIA:  General.   COMPLICATIONS:  None.   DRAINS:  None.   BRIEF HISTORY:  This 60 year old female underwent first stage Interstim  implantation with implantation of two first stage leads. The patient has had  an excellent response. She previously had longstanding problems with an  inability to empty her bladder to completion that was present despite the  use of medical management and physical therapy. The patient has now had  voiding volumes as high as 21 ounces, her residual urine has dropped down  and we consistently saw voiding volumes of 600 mL or more. The patient has a  completely normal voiding pattern and this has persisted for the two weeks  of the trial. The patient had a good response to both the right and the left  lead and was offered the opportunity to consider bilateral stimulation. She  found however that she preferred the standard single lead and wished to have  the left side rather than the right.  We have jointly made the decision to  leave the right side lead in place in case she ever changed her mind and  wants a bilateral stimulator. She understands the risks and benefits of the  procedure and gave full and informed consent.   DESCRIPTION OF  PROCEDURE:  After successful induction of general anesthesia,  the patient was placed in the prone position. She had been given  preoperative Cipro and gentamycin. She had a full 10 minute scrub and paint  to her back, a vidrape was placed. The previous sutures were removed, the  incision on the lateral aspect of the left buttock was extended and  deepened. The connections to the external leads were identified. The right  lead was identified by the Prolene tie, the left lead was identified by the  silk ties. The right lead extender was cut off and the lead itself was left  in place with the connective booty and Prolene ties intact. The lead  extender on the left hand side was removed, the booty was then cut away and  the entire external lead was removed. The left lead was then connected to  the lead extended in the usual fashion, the booties were tied down with silk  ties.  The lead extender was then attached to the generator. The generator  was programmed, impedance testing was performed once it had  been inserted  and everything appeared to be normal. The device was left in a small pocket  that had been developed, hemostasis obtained with the electrocautery. The  incision was closed with a running suture of 2-0 Vicryl followed by a  running double crossing suture of 3-0 nylon. The patient tolerated the  procedure well and was taken to the recovery room in good condition.      RJE/MEDQ  D:  04/11/2004  T:  04/11/2004  Job:  [id number]   cc:   Dr. Tiburcio Pea, [place], [place]

## 2010-11-28 NOTE — Op Note (Signed)
NAME:  [name], [name]                       ACCOUNT NO.:  [id number]   MEDICAL RECORD NO.:  [id number]                   PATIENT TYPE:  AMB   LOCATION:  [facility]                                 FACILITY:  [facility]   PHYSICIAN:  Jamison Neighbor, M.D.               DATE OF BIRTH:  04/23/1951   DATE OF PROCEDURE:  03/28/2004  DATE OF DISCHARGE:                                 OPERATIVE REPORT   PREOPERATIVE DIAGNOSES:  1.  Urgency incontinence.  2.  Urinary retention.   POSTOPERATIVE DIAGNOSES:  1.  Urgency incontinence.  2.  Urinary retention.   PROCEDURE:  First stage InterStim implantation (bilateral).   SURGEON:  Jamison Neighbor, M.D.   ANESTHESIA:  Local plus sedation.   COMPLICATIONS:  None.   DRAINS:  A 16 French Foley catheter plus sedation.   COMPLICATIONS:  None.   DRAINS:  A 16 French Foley catheter, removed postoperatively.   HISTORY:  This 60 year old female has had problems with urgency incontinence  as well as some degree of difficulty emptying her bladder to completion.  The patient has definite pelvic floor spasms and has been treated for pelvic  floor dysfunction with alpha blockage, Valium, biofeedback, and physical  therapy.  While the patient responded in part to biofeedback and physical  therapy, she has not responded all that well to medical management and has  intermittent episodes where she just has not emptied her bladder well and  has episodes of overflow incontinence.  At other times, she has severe  urgency and has loss of urine; however, she does not have stress  incontinence, and urodynamics has not demonstrated any leakage.  The patient  is interested in InterStim trial.  She is aware of a fact that there is no  guarantee that she will get a permanent result and that this is a testing  phase.  If she does have a good response, she will have the second stage  implanted at a later time.  The patient understands the risks and benefits  of the  procedure, gave full, informed consent of the procedure.  After  successful induction of general anesthesia, the patient was placed in the  prone position.  She was taped to the bed, as her buttocks were pulled  apart.  She was given a full 10 minute scrub and paint with Betadine.  She  then had a Vi-drape applied in order to expose the rectal sphincter.  Standard drapes were then applied.  The fluoroscopic unit was utilized to  determine the level of the third sacral foramen.  This was marked out on the  skin.  Local infiltration with a mixture of lidocaine and Marcaine was then  performed.  The ____________ needle was passed into the third sacral  foramen.  This was confirmed with fluoroscopy.  The second and fourth sacral  foramen were also identified.  With leads appropriately placed in the third  sacral foramen, the patient had a very modest Bellows effect on the right-  hand side but very little effect on the left.  Attempts to awaken the  patient and lower her sedation to determine if she could sense if it was in  the right place was not successful.  We never saw much in the way of  dorsiflexion of the great toe either.  Since we really could not get  independent confirmation of the appropriate place, the decision was made to  implant based on radiographic findings alone and to try to get the optimal  location with the hopes that the patient would get a good response when she  was awake.  Therefore, the wires were positioned so that they were in the  third sacral foramen bilaterally, as determined by PA and lateral films and  that they were on a gentle angle to follow the course of the nerve.  The  guidewire was passed down through the trocar on each side.  The dilating  system was used to develop a tract from the small incision down to the  nerve.  On both sides, the quadripolar lead was paced down within the  dilating system and positioned, so that it was just below the sacrum.   In  this way, there was appropriate coverage of the nerve bilaterally.  The  patient had a small incision made in the upper portion of the left buttock.  The tunneling tool was used to bring the leads over from the left-hand side.  The left lead was brought to the lower portion of that small incision.  The  right lead was brought into the upper portion.  The external lead was then  brought out so that the left lead was attached to the lower lead extender  with silk ties.  The right lead was brought to the upper incision with  Prolene ties.  The connections were all tied down with the protective  booties in place.  The incision was irrigated, and then the connections were  placed down within the small incision.  A Vicryl stitch was used as well as  a few nylon sutures to close all the puncture sites.  The patient tolerated  the procedure well and was taken to the recovery room in good condition.  She will utilize the two leads, each for half a week and will let us know  when she returns in one week whether she is having a change in her voiding.  We can make a decision at that point as to whether to proceed.      RJE/MEDQ  D:  03/28/2004  T:  03/28/2004  Job:  [id number]

## 2011-02-03 ENCOUNTER — Encounter: Payer: Self-pay | Admitting: Cardiovascular Disease

## 2011-02-05 ENCOUNTER — Ambulatory Visit (INDEPENDENT_AMBULATORY_CARE_PROVIDER_SITE_OTHER): Payer: BC Managed Care – PPO | Admitting: Cardiovascular Disease

## 2011-02-05 ENCOUNTER — Encounter: Payer: Self-pay | Admitting: Cardiovascular Disease

## 2011-02-05 DIAGNOSIS — I10 Essential (primary) hypertension: Secondary | ICD-10-CM

## 2011-02-05 DIAGNOSIS — I499 Cardiac arrhythmia, unspecified: Secondary | ICD-10-CM

## 2011-02-05 NOTE — Patient Instructions (Signed)
You are at low risk for your upcoming knee surgery.

## 2011-02-05 NOTE — Assessment & Plan Note (Signed)
Her diastolic blood pressures up little bit today. I've asked her to continue with a good low-salt diet. She should be able to exercise after she gets her knee fixed.

## 2011-02-05 NOTE — Progress Notes (Signed)
[name] Date of Birth  1950-07-28 [facility] / [facility] [address]     [address] [place], [place]  [address] [phone]  Fax  [phone]  History of Present Illness:  [name] is a middle-aged female resting for the past several years for history of chest pain , history of hypertension and supraventricular tachycardia. She is done quite well since I last saw her.  She's not had any particular problems. She needs to have her right knee replaced. She has not been able to exercise much because of knee problems.  Her blood pressure has been well-controlled.   Current Outpatient Prescriptions  Medication Sig Dispense Refill  . ALPRAZolam (XANAX) 0.5 MG tablet Take 0.5 mg by mouth at bedtime as needed.        Marland Kitchen amitriptyline (ELAVIL) 50 MG tablet Take 50 mg by mouth at bedtime.        . Calcium Carbonate-Vitamin D (CALCIUM + D PO) Take by mouth 2 (two) times daily.        . Cetirizine HCl (ZYRTEC ALLERGY PO) Take by mouth 2 (two) times daily.        . Cyclobenzaprine HCl (FLEXERIL PO) Take 10 mg by mouth at bedtime.       Marland Kitchen ezetimibe (ZETIA) 10 MG tablet Take 10 mg by mouth daily.        . fish oil-omega-3 fatty acids 1000 MG capsule Take 2 g by mouth daily.        Marland Kitchen GLUCOSAMINE HCL PO Take by mouth 2 (two) times daily.        [name] Tea, Camillia sinensis, (GREEN TEA PO) Take by mouth as directed.        . metoprolol (TOPROL-XL) 100 MG 24 hr tablet Take 100 mg by mouth daily.        . Multiple Vitamin (MULTIVITAMIN) tablet Take 1 tablet by mouth daily.        . pentosan polysulfate (ELMIRON) 100 MG capsule Take 100 mg by mouth 3 (three) times daily before meals.        . Pomegranate, Punica granatum, (POMEGRANATE EXTRACT PO) Take 250 mg by mouth 2 (two) times daily.        . propranolol (INDERAL) 10 MG tablet Take 10 mg by mouth as needed.        . rizatriptan (MAXALT) 10 MG tablet Take 10 mg by mouth as needed. May repeat in 2 hours if needed         . spironolactone (ALDACTONE) 50 MG tablet Take 50 mg by mouth daily.        . traMADol (ULTRAM) 50 MG tablet Take 50 mg by mouth every 6 (six) hours as needed.           Allergies  Allergen Reactions  . Amoxicillin     REACTION: Hives  . Clarithromycin     REACTION: Hives  . Crestor (Rosuvastatin Calcium)     ARM PAIN  . Doxycycline     REACTION: Hives  . Penicillins     REACTION: Hives  . Pravachol     ARM PAIN    Past Medical History  Diagnosis Date  . Chest pain   . Hypertension   . SVT (supraventricular tachycardia)   . SOB (shortness of breath)   . Migraine headache   . Hyperlipidemia   . Chest tightness     Past Surgical History  Procedure Date  . Appendectomy   . Cholecystectomy   .  Foot surgery   . Nose surgery   . Doppler echocardiography 02/11/2000    EF 60-65%  . Cardiovascular stress test 11/05/2003    EF 68%    History  Smoking status  . Never Smoker   Smokeless tobacco  . Not on file    History  Alcohol Use No    Family History  Problem Relation Age of Onset  . Myocarditis Mother   . Aneurysm Father     Reviw of Systems:  Reviewed in the HPI.  All other systems are negative.  Physical Exam: BP 126/98  Pulse 83  Ht 5\' 6"  (1.676 m)  Wt 224 lb (101.606 kg)  BMI 36.15 kg/m2 The patient is alert and oriented x 3.  The mood and affect are normal.   Skin: warm and dry.  Color is normal.    HEENT:   the sclera are nonicteric.  The mucous membranes are moist.  The carotids are 2+ without bruits.  There is no thyromegaly.  There is no JVD.    Lungs: clear.  The chest wall is non tender.    Heart: regular rate with a normal S1 and S2.  There are no murmurs, gallops, or rubs. The PMI is not displaced.     Abdomen: good bowel sounds.  There is no guarding or rebound.  There is no hepatosplenomegaly or tenderness.  There are no masses.   Extremities:  no clubbing, cyanosis, or edema.  The legs are without rashes.  The distal pulses are  intact.   Neuro:  Cranial nerves II - XII are intact.  Motor and sensory functions are intact.    The gait is normal.  ECG: Normal sinus rhythm. She has no ST or T wave changes.  Assessment / Plan:

## 2011-02-05 NOTE — Assessment & Plan Note (Signed)
[name] has been very well controlled. She has not had any further episodes of supraventricular tachycardia. She is at low risk for upcoming knee surgery.

## 2011-02-06 ENCOUNTER — Encounter: Payer: Self-pay | Admitting: Cardiovascular Disease

## 2011-02-11 ENCOUNTER — Encounter: Payer: Self-pay | Admitting: Cardiovascular Disease

## 2011-03-17 ENCOUNTER — Other Ambulatory Visit (HOSPITAL_COMMUNITY): Payer: Self-pay | Admitting: Orthopedic Surgery

## 2011-03-17 ENCOUNTER — Encounter (HOSPITAL_COMMUNITY)
Admission: RE | Admit: 2011-03-17 | Discharge: 2011-03-17 | Disposition: A | Discharge status: Home / Self Care | Payer: BC Managed Care – PPO | Source: Ambulatory Visit | Attending: Orthopedic Surgery | Admitting: Orthopedic Surgery

## 2011-03-17 DIAGNOSIS — M1712 Unilateral primary osteoarthritis, left knee: Secondary | ICD-10-CM

## 2011-03-17 DIAGNOSIS — M1711 Unilateral primary osteoarthritis, right knee: Secondary | ICD-10-CM

## 2011-03-17 LAB — CBC
HCT: 42.8 % (ref 36.0–46.0)
Hemoglobin: 14.9 g/dL (ref 12.0–15.0)
MCH: 32.2 pg (ref 26.0–34.0)
MCHC: 34.8 g/dL (ref 30.0–36.0)
MCV: 92.4 fL (ref 78.0–100.0)
Platelets: 269 10*3/uL (ref 150–400)
RBC: 4.63 MIL/uL (ref 3.87–5.11)
RDW: 12.8 % (ref 11.5–15.5)
WBC: 7.1 10*3/uL (ref 4.0–10.5)

## 2011-03-17 LAB — COMPREHENSIVE METABOLIC PANEL
ALT: 51 U/L — ABNORMAL HIGH (ref 0–35)
AST: 40 U/L — ABNORMAL HIGH (ref 0–37)
Albumin: 4.3 g/dL (ref 3.5–5.2)
Alkaline Phosphatase: 76 U/L (ref 39–117)
BUN: 14 mg/dL (ref 6–23)
CO2: 29 mEq/L (ref 19–32)
Calcium: 10.5 mg/dL (ref 8.4–10.5)
Chloride: 102 mEq/L (ref 96–112)
Creatinine, Ser: 0.95 mg/dL (ref 0.50–1.10)
GFR calc Af Amer: >60 mL/min (ref >60)
GFR calc non Af Amer: >60 mL/min (ref >60)
Glucose, Bld: 100 mg/dL — ABNORMAL HIGH (ref 70–99)
Potassium: 4.2 mEq/L (ref 3.5–5.1)
Sodium: 140 mEq/L (ref 135–145)
Total Bilirubin: 0.4 mg/dL (ref 0.3–1.2)
Total Protein: 7.5 g/dL (ref 6.0–8.3)

## 2011-03-17 LAB — URINALYSIS, ROUTINE W REFLEX MICROSCOPIC
Bilirubin Urine: NEGATIVE
Glucose, UA: NEGATIVE mg/dL
Hgb urine dipstick: NEGATIVE
Ketones, ur: NEGATIVE mg/dL
Leukocytes, UA: NEGATIVE
Nitrite: NEGATIVE
Protein, ur: NEGATIVE mg/dL
Specific Gravity, Urine: 1.021 (ref 1.005–1.030)
Urobilinogen, UA: 0.2 mg/dL (ref 0.0–1.0)
pH: 6 (ref 5.0–8.0)

## 2011-03-17 LAB — SURGICAL PCR SCREEN
MRSA, PCR: NEGATIVE
Staphylococcus aureus: NEGATIVE

## 2011-03-17 LAB — PROTIME-INR
INR: 0.94 (ref 0.00–1.49)
Prothrombin Time: 12.8 seconds (ref 11.6–15.2)

## 2011-03-17 LAB — APTT: aPTT: 30 seconds (ref 24–37)

## 2011-03-23 ENCOUNTER — Inpatient Hospital Stay (HOSPITAL_COMMUNITY): Payer: BC Managed Care – PPO

## 2011-03-23 ENCOUNTER — Inpatient Hospital Stay (HOSPITAL_COMMUNITY)
Admission: RE | Admit: 2011-03-23 | Discharge: 2011-03-25 | DRG: 209 | Disposition: A | Discharge status: Home Health | Payer: BC Managed Care – PPO | Source: Ambulatory Visit | Attending: Orthopedic Surgery | Admitting: Orthopedic Surgery

## 2011-03-23 DIAGNOSIS — K219 Gastro-esophageal reflux disease without esophagitis: Secondary | ICD-10-CM | POA: Diagnosis present

## 2011-03-23 DIAGNOSIS — G43909 Migraine, unspecified, not intractable, without status migrainosus: Secondary | ICD-10-CM | POA: Diagnosis present

## 2011-03-23 DIAGNOSIS — M1710 Unilateral primary osteoarthritis, unspecified knee: Principal | ICD-10-CM | POA: Diagnosis present

## 2011-03-23 DIAGNOSIS — Z880 Allergy status to penicillin: Secondary | ICD-10-CM

## 2011-03-23 DIAGNOSIS — E669 Obesity, unspecified: Secondary | ICD-10-CM | POA: Diagnosis present

## 2011-03-23 DIAGNOSIS — F411 Generalized anxiety disorder: Secondary | ICD-10-CM | POA: Diagnosis present

## 2011-03-23 DIAGNOSIS — Z888 Allergy status to other drugs, medicaments and biological substances status: Secondary | ICD-10-CM

## 2011-03-23 LAB — ABO/RH: ABO/RH(D): O POS

## 2011-03-23 LAB — TYPE AND SCREEN
ABO/RH(D): O POS
Antibody Screen: NEGATIVE

## 2011-03-24 ENCOUNTER — Inpatient Hospital Stay (HOSPITAL_COMMUNITY): Payer: BC Managed Care – PPO

## 2011-03-24 LAB — BASIC METABOLIC PANEL
BUN: 9 mg/dL (ref 6–23)
CO2: 29 mEq/L (ref 19–32)
Calcium: 8.9 mg/dL (ref 8.4–10.5)
Chloride: 98 mEq/L (ref 96–112)
Creatinine, Ser: 0.75 mg/dL (ref 0.50–1.10)
GFR calc Af Amer: >60 mL/min (ref >60)
GFR calc non Af Amer: >60 mL/min (ref >60)
Glucose, Bld: 147 mg/dL — ABNORMAL HIGH (ref 70–99)
Potassium: 3.9 mEq/L (ref 3.5–5.1)
Sodium: 136 mEq/L (ref 135–145)

## 2011-03-24 LAB — CBC
HCT: 36.8 % (ref 36.0–46.0)
Hemoglobin: 12.7 g/dL (ref 12.0–15.0)
MCH: 31.5 pg (ref 26.0–34.0)
MCHC: 34.5 g/dL (ref 30.0–36.0)
MCV: 91.3 fL (ref 78.0–100.0)
Platelets: 237 10*3/uL (ref 150–400)
RBC: 4.03 MIL/uL (ref 3.87–5.11)
RDW: 12.5 % (ref 11.5–15.5)
WBC: 9.7 10*3/uL (ref 4.0–10.5)

## 2011-03-24 LAB — CARDIAC PANEL(CRET KIN+CKTOT+MB+TROPI)
CK, MB: 2.9 ng/mL (ref 0.3–4.0)
Relative Index: 1.3 (ref 0.0–2.5)
Total CK: 222 U/L — ABNORMAL HIGH (ref 7–177)
Troponin I: <0.3 ng/mL (ref <0.30)

## 2011-03-24 NOTE — Op Note (Signed)
[name], [name]:  [id number]  MEDICAL RECORD NO.:  [id number]  LOCATION:  2550                         FACILITY:  [facility]  PHYSICIAN:  Loreta Ave, M.D. DATE OF BIRTH:  [birth year]  DATE OF PROCEDURE:  03/23/2011 DATE OF DISCHARGE:                              OPERATIVE REPORT   PREOPERATIVE DIAGNOSES: 1. Right knee end-stage degenerative arthritis. 2. Left knee end-stage degenerative arthritis.  POSTOPERATIVE DIAGNOSES: 1. Right knee end-stage degenerative arthritis. 2. Left knee end-stage degenerative arthritis.  PROCEDURES: 1. Right knee modified minimally invasive total knee replacement,     Stryker triathlon prosthesis.  Soft tissue balancing.  Cemented     pegged posterior stabilized #5 femoral component.  Cemented #5     tibial component, 9-mm polyethylene insert.  Resurfacing 35-mm     patellar component. 2. Left knee intraarticular injection, Depo-Medrol and Marcaine.  SURGEON:  Loreta Ave, MD  ASSISTANT:  Genene Churn. Barry Dienes, [place], present throughout the entire case and necessary for timely completion of procedure.  ANESTHESIA:  General.  BLOOD LOSS:  Minimal.  SPECIMEN:  None.  CULTURE:  None.  COMPLICATIONS:  None.  DRESSING:  Soft compressive knee immobilizer on the right.  DRAIN:  Hemovac x1.  TOURNIQUET TIME:  One the right 50 minutes.  PROCEDURE:  The patient was brought to the operating room and placed on the operating table in supine position.  After adequate anesthesia had been obtained, the left knee was injected with Depo-Medrol and Marcaine intra-articularly under sterile technique.  Attention was turned to the right.  Tourniquet was applied.  Prepped and draped in the usual sterile fashion.  Exsanguinated with elevation of Esmarch, tourniquet was inflated to 350 mmHg.  Straight incision above the patella down to the tibial tubercle.  Hemostasis with cautery.  Medial arthrotomy, vastus splitting preserving  quad tendon.  Knee was exposed.  Grade 4 changes patellofemoral joint to lesser extent on the compartments.  Remnants of menisci, cruciate ligaments, periarticular spurs were removed. Intramedullary guide femur.  10-mm resection set at 5 degrees of valgus. Using epicondylar axis, the femur was sized, cut, and fitted.  Posterior stabilized peg #5 component.  Extramedullary guide on the tibia.  3- degree posterior slope cut.  Size #5 component as well.  Debris was cleared throughout.  Nicely balanced in flexion and extension.  Patella was exposed, posterior 10 mm was removed.  Drilled, sized, and fitted for a 35-mm component.  Trial was put in place throughout.  #5 above and below 9-mm insert and a 35 on the patella.  With this, good mechanical axis, good balancing flexion/extension, and good patellofemoral tracking.  Tibia was marked for rotation and hand reamed.  All trials were removed.  Copious irrigation with a pulse irrigating device. Cement was prepared, placed on all components, firmly seated. Polyethylene attached to tibia, knee was reduced.  Patella was held with a clamp.  Once cement hardened, knee was reexamined and again pleased with alignment, stability, and tracking.  Hemovac was placed and brought out through a separate stab wound.  Arthrotomy was closed with #1 Vicryl, skin and subcutaneous tissue with Vicryl and staples.  Sterile compressive dressing was applied.  Tourniquet  was deflated and removed. Knee immobilizer was applied.  Anesthesia was reversed.  Brought to the recovery room.  Tolerated the surgery well.  No complications.     Loreta Ave, M.D.     DFM/MEDQ  D:  03/23/2011  T:  03/23/2011  Job:  [id number]  Electronically Signed by Mckinley Jewel M.D. on 03/24/2011 02:47:57 PM

## 2011-03-25 DIAGNOSIS — M7989 Other specified soft tissue disorders: Secondary | ICD-10-CM

## 2011-03-25 LAB — CBC
HCT: 36.9 % (ref 36.0–46.0)
Hemoglobin: 12.6 g/dL (ref 12.0–15.0)
MCH: 31.7 pg (ref 26.0–34.0)
MCHC: 34.1 g/dL (ref 30.0–36.0)
MCV: 92.7 fL (ref 78.0–100.0)
Platelets: 237 10*3/uL (ref 150–400)
RBC: 3.98 MIL/uL (ref 3.87–5.11)
RDW: 12.9 % (ref 11.5–15.5)
WBC: 9.5 10*3/uL (ref 4.0–10.5)

## 2011-03-25 LAB — BASIC METABOLIC PANEL
BUN: 11 mg/dL (ref 6–23)
CO2: 29 mEq/L (ref 19–32)
Calcium: 9.2 mg/dL (ref 8.4–10.5)
Chloride: 103 mEq/L (ref 96–112)
Creatinine, Ser: 0.67 mg/dL (ref 0.50–1.10)
GFR calc Af Amer: >60 mL/min (ref >60)
GFR calc non Af Amer: >60 mL/min (ref >60)
Glucose, Bld: 118 mg/dL — ABNORMAL HIGH (ref 70–99)
Potassium: 4 mEq/L (ref 3.5–5.1)
Sodium: 138 mEq/L (ref 135–145)

## 2011-03-27 ENCOUNTER — Ambulatory Visit
Admission: RE | Admit: 2011-03-27 | Discharge: 2011-03-27 | Disposition: A | Discharge status: Home / Self Care | Payer: BC Managed Care – PPO | Source: Ambulatory Visit | Attending: Sports Medicine | Admitting: Sports Medicine

## 2011-03-27 ENCOUNTER — Other Ambulatory Visit: Payer: Self-pay | Admitting: Sports Medicine

## 2011-03-27 ENCOUNTER — Telehealth: Payer: Self-pay | Admitting: Cardiovascular Disease

## 2011-03-27 DIAGNOSIS — R509 Fever, unspecified: Secondary | ICD-10-CM

## 2011-03-27 MED ORDER — PROPRANOLOL HCL 10 MG PO TABS: 10.0000 mg | ORAL_TABLET | ORAL | Status: DC | PRN

## 2011-03-27 MED ORDER — IOHEXOL 300 MG/ML  SOLN
125.0000 mL | Freq: Once | INTRAMUSCULAR | Status: AC | PRN
  Administered 2011-03-27: 125 mL via INTRAVENOUS

## 2011-03-27 NOTE — Telephone Encounter (Signed)
Pt pulse 113-116 per physical therapy for RTKA, pt running fever also, will call back if propanol doesn't help bring pulse down. Pt didn't have any propranolol available, hasnt had to use for along time. Pt verbalized understanding. Alfonso Ramus RN

## 2011-03-27 NOTE — Telephone Encounter (Signed)
Pt is calling saying her heart rate is up and doesn't have anymore propanolol. Please call

## 2011-04-07 ENCOUNTER — Other Ambulatory Visit: Payer: Self-pay | Admitting: Family Medicine

## 2011-04-07 DIAGNOSIS — R221 Localized swelling, mass and lump, neck: Secondary | ICD-10-CM

## 2011-04-08 ENCOUNTER — Ambulatory Visit
Admission: RE | Admit: 2011-04-08 | Discharge: 2011-04-08 | Disposition: A | Discharge status: Home / Self Care | Payer: BC Managed Care – PPO | Source: Ambulatory Visit | Attending: Family Medicine | Admitting: Family Medicine

## 2011-04-08 DIAGNOSIS — R221 Localized swelling, mass and lump, neck: Secondary | ICD-10-CM

## 2011-04-17 ENCOUNTER — Other Ambulatory Visit (HOSPITAL_COMMUNITY): Payer: Self-pay | Admitting: Family Medicine

## 2011-04-17 DIAGNOSIS — E042 Nontoxic multinodular goiter: Secondary | ICD-10-CM

## 2011-04-17 DIAGNOSIS — Q892 Congenital malformations of other endocrine glands: Secondary | ICD-10-CM

## 2011-05-11 ENCOUNTER — Ambulatory Visit (HOSPITAL_COMMUNITY): Payer: BC Managed Care – PPO

## 2011-05-11 ENCOUNTER — Encounter (HOSPITAL_COMMUNITY)
Admission: RE | Admit: 2011-05-11 | Discharge: 2011-05-11 | Disposition: A | Discharge status: Home / Self Care | Payer: BC Managed Care – PPO | Source: Ambulatory Visit | Attending: Family Medicine | Admitting: Family Medicine

## 2011-05-11 DIAGNOSIS — Q892 Congenital malformations of other endocrine glands: Secondary | ICD-10-CM

## 2011-05-11 DIAGNOSIS — R9389 Abnormal findings on diagnostic imaging of other specified body structures: Secondary | ICD-10-CM | POA: Insufficient documentation

## 2011-05-11 DIAGNOSIS — E042 Nontoxic multinodular goiter: Secondary | ICD-10-CM

## 2011-05-12 ENCOUNTER — Other Ambulatory Visit (HOSPITAL_COMMUNITY): Payer: BC Managed Care – PPO

## 2011-05-12 ENCOUNTER — Encounter (HOSPITAL_COMMUNITY)
Admission: RE | Admit: 2011-05-12 | Discharge: 2011-05-12 | Disposition: A | Discharge status: Home / Self Care | Payer: BC Managed Care – PPO | Source: Ambulatory Visit | Attending: Family Medicine | Admitting: Family Medicine

## 2011-05-12 MED ORDER — SODIUM IODIDE I 131 CAPSULE: 8.5000 | Freq: Once | INTRAVENOUS | Status: AC | PRN

## 2011-05-12 MED ORDER — SODIUM PERTECHNETATE TC 99M INJECTION
10.9000 | Freq: Once | INTRAVENOUS | Status: AC | PRN
  Administered 2011-05-12: 10.9 via INTRAVENOUS

## 2011-05-21 ENCOUNTER — Other Ambulatory Visit (HOSPITAL_COMMUNITY): Payer: Self-pay | Admitting: Family Medicine

## 2011-05-21 DIAGNOSIS — J9859 Other diseases of mediastinum, not elsewhere classified: Secondary | ICD-10-CM

## 2011-05-21 DIAGNOSIS — E041 Nontoxic single thyroid nodule: Secondary | ICD-10-CM

## 2011-05-22 ENCOUNTER — Other Ambulatory Visit: Payer: Self-pay | Admitting: Cardiovascular Disease

## 2011-05-22 NOTE — Telephone Encounter (Signed)
Fax Received. Refill Completed. Herbie Lehrmann Chowoe (M.A)  

## 2011-06-01 ENCOUNTER — Other Ambulatory Visit: Payer: Self-pay | Admitting: Radiology

## 2011-06-02 ENCOUNTER — Ambulatory Visit (HOSPITAL_COMMUNITY)
Admission: RE | Admit: 2011-06-02 | Discharge: 2011-06-02 | Disposition: A | Discharge status: Home / Self Care | Payer: BC Managed Care – PPO | Source: Ambulatory Visit | Attending: Family Medicine | Admitting: Family Medicine

## 2011-06-02 DIAGNOSIS — E063 Autoimmune thyroiditis: Secondary | ICD-10-CM | POA: Insufficient documentation

## 2011-06-02 DIAGNOSIS — N3010 Interstitial cystitis (chronic) without hematuria: Secondary | ICD-10-CM | POA: Insufficient documentation

## 2011-06-02 DIAGNOSIS — Z01812 Encounter for preprocedural laboratory examination: Secondary | ICD-10-CM | POA: Insufficient documentation

## 2011-06-02 DIAGNOSIS — R197 Diarrhea, unspecified: Secondary | ICD-10-CM | POA: Insufficient documentation

## 2011-06-02 DIAGNOSIS — I10 Essential (primary) hypertension: Secondary | ICD-10-CM | POA: Insufficient documentation

## 2011-06-02 DIAGNOSIS — J9859 Other diseases of mediastinum, not elsewhere classified: Secondary | ICD-10-CM

## 2011-06-02 DIAGNOSIS — E785 Hyperlipidemia, unspecified: Secondary | ICD-10-CM | POA: Insufficient documentation

## 2011-06-02 DIAGNOSIS — K5190 Ulcerative colitis, unspecified, without complications: Secondary | ICD-10-CM | POA: Insufficient documentation

## 2011-06-02 DIAGNOSIS — E041 Nontoxic single thyroid nodule: Secondary | ICD-10-CM

## 2011-06-02 DIAGNOSIS — I499 Cardiac arrhythmia, unspecified: Secondary | ICD-10-CM | POA: Insufficient documentation

## 2011-06-02 DIAGNOSIS — IMO0001 Reserved for inherently not codable concepts without codable children: Secondary | ICD-10-CM | POA: Insufficient documentation

## 2011-06-02 LAB — PROTIME-INR
INR: 0.96 (ref 0.00–1.49)
Prothrombin Time: 13 seconds (ref 11.6–15.2)

## 2011-06-02 LAB — BASIC METABOLIC PANEL
BUN: 13 mg/dL (ref 6–23)
CO2: 26 mEq/L (ref 19–32)
Calcium: 9.4 mg/dL (ref 8.4–10.5)
Chloride: 103 mEq/L (ref 96–112)
Creatinine, Ser: 0.78 mg/dL (ref 0.50–1.10)
GFR calc Af Amer: >90 mL/min (ref >90)
GFR calc non Af Amer: >90 mL/min (ref >90)
Glucose, Bld: 112 mg/dL — ABNORMAL HIGH (ref 70–99)
Potassium: 4.1 mEq/L (ref 3.5–5.1)
Sodium: 138 mEq/L (ref 135–145)

## 2011-06-02 LAB — CBC
HCT: 39.3 % (ref 36.0–46.0)
Hemoglobin: 13.3 g/dL (ref 12.0–15.0)
MCH: 30.8 pg (ref 26.0–34.0)
MCHC: 33.8 g/dL (ref 30.0–36.0)
MCV: 91 fL (ref 78.0–100.0)
Platelets: 284 10*3/uL (ref 150–400)
RBC: 4.32 MIL/uL (ref 3.87–5.11)
RDW: 13.1 % (ref 11.5–15.5)
WBC: 4.9 10*3/uL (ref 4.0–10.5)

## 2011-06-02 LAB — APTT: aPTT: 32 seconds (ref 24–37)

## 2011-06-02 MED ORDER — SODIUM CHLORIDE 0.9 % IV SOLN: INTRAVENOUS | Status: DC

## 2011-06-02 MED ORDER — MIDAZOLAM HCL 5 MG/5ML IJ SOLN
INTRAMUSCULAR | Status: AC | PRN
  Administered 2011-06-02 (×2): 1 mg via INTRAVENOUS

## 2011-06-02 MED ORDER — FENTANYL CITRATE 0.05 MG/ML IJ SOLN
INTRAMUSCULAR | Status: AC | PRN
  Administered 2011-06-02: 25 ug via INTRAVENOUS
  Administered 2011-06-02: 50 ug via INTRAVENOUS
  Administered 2011-06-02: 25 ug via INTRAVENOUS

## 2011-06-02 MED ORDER — FENTANYL CITRATE 0.05 MG/ML IJ SOLN
INTRAMUSCULAR | Status: AC
  Filled 2011-06-02: qty 4

## 2011-06-02 MED ORDER — MIDAZOLAM HCL 2 MG/2ML IJ SOLN
INTRAMUSCULAR | Status: AC
  Filled 2011-06-02: qty 4

## 2011-06-02 NOTE — H&P (Signed)
[name] is an 60 y.o. female.   Chief Complaint : Anterior mediastinal/ left thyroid masses HPI: Patient with history of anterior mediastinal mass and left thyroid nodule presents today for US/CT guided biopsy .  Past Medical History  Diagnosis Date  . Chest pain   . Hypertension   . SVT (supraventricular tachycardia)   . SOB (shortness of breath)   . Migraine headache   . Hyperlipidemia   . Chest tightness     Past Surgical History  Procedure Date  . Appendectomy   . Cholecystectomy   . Foot surgery   . Nose surgery   . Doppler echocardiography 02/11/2000    EF 60-65%  . Cardiovascular stress test 11/05/2003    EF 68%  Rt TKR, LEFT SHOULDER SURG,TONSILLECTOMY, HYSTERECTOMY  Family History  Problem Relation Age of Onset  . Myocarditis Mother   . Aneurysm Father    Social History:  reports that she has never smoked. She does not have any smokeless tobacco history on file. She reports that she does not drink alcohol or use illicit drugs.  Allergies:  Allergies  Allergen Reactions  . Amoxicillin     REACTION: Hives  . Clarithromycin     REACTION: Hives  . Crestor (Rosuvastatin Calcium)     ARM PAIN  . Doxycycline     REACTION: Hives  . Penicillins     REACTION: Hives  . Pravachol     ARM PAIN    Medications Prior to Admission  Medication Sig Dispense Refill  . ALPRAZolam (XANAX) 0.5 MG tablet Take 0.5 mg by mouth at bedtime as needed. Anxiety, sleep      . amitriptyline (ELAVIL) 50 MG tablet Take 50 mg by mouth at bedtime.       . Calcium Carbonate-Vitamin D (CALCIUM + D PO) Take 1 tablet by mouth every morning.       . Cetirizine HCl (ZYRTEC ALLERGY PO) Take 1 tablet by mouth daily as needed. For seasonal allergies      . Cyclobenzaprine HCl (FLEXERIL PO) Take 5 mg by mouth at bedtime.       Marland Kitchen ezetimibe (ZETIA) 10 MG tablet        . fish oil-omega-3 fatty acids 1000 MG capsule Take 1 g by mouth 2 (two) times daily.       Marland Kitchen GLUCOSAMINE HCL PO Take 1  tablet by mouth 2 (two) times daily.       . metoprolol (TOPROL-XL) 100 MG 24 hr tablet Take 100 mg by mouth at bedtime.       . Multiple Vitamin (MULTIVITAMIN) tablet Take 1 tablet by mouth daily.       . pentosan polysulfate (ELMIRON) 100 MG capsule Take 100-200 mg by mouth 2 (two) times daily. Patient takes 2 tabs in morning and 1 tab in the evening      . Pomegranate, Punica granatum, (POMEGRANATE EXTRACT PO) Take 2 tablets by mouth 2 (two) times daily.       . propranolol (INDERAL) 10 MG tablet Take 10 mg by mouth daily as needed. For svt episodes       . rizatriptan (MAXALT) 10 MG tablet Take 10 mg by mouth daily as needed. May repeat in 2 hours if needed. For migraines      . spironolactone (ALDACTONE) 50 MG tablet Take 50 mg by mouth daily.       Chilton Si Tea, Camillia sinensis, (GREEN TEA PO) Take 1-2 tablets by mouth as directed.       Marland Kitchen  traMADol (ULTRAM) 50 MG tablet Take 50 mg by mouth every 6 (six) hours as needed. pain       Medications Prior to Admission  Medication Dose Route Frequency Provider Last Rate Last Dose  . 0.9 %  sodium chloride infusion   Intravenous Continuous Holley Bouche      . fentaNYL (SUBLIMAZE) 0.05 MG/ML injection           . midazolam (VERSED) 2 MG/2ML injection             Results for orders placed during the hospital encounter of 06/02/11 (from the past 48 hour(s))  APTT     Status: Normal   Collection Time   06/02/11  8:21 AM      Component Value Range Comment   aPTT 32  24 - 37 (seconds)   BASIC METABOLIC PANEL     Status: Abnormal   Collection Time   06/02/11  8:21 AM      Component Value Range Comment   Sodium 138  135 - 145 (mEq/L)    Potassium 4.1  3.5 - 5.1 (mEq/L)    Chloride 103  96 - 112 (mEq/L)    CO2 26  19 - 32 (mEq/L)    Glucose, Bld 112 (*) 70 - 99 (mg/dL)    BUN 13  6 - 23 (mg/dL)    Creatinine, Ser 1.61  0.50 - 1.10 (mg/dL)    Calcium 9.4  8.4 - 10.5 (mg/dL)    GFR calc non Af Amer >90  >90 (mL/min)    GFR calc Af Amer >90   >90 (mL/min)   CBC     Status: Normal   Collection Time   06/02/11  8:21 AM      Component Value Range Comment   WBC 4.9  4.0 - 10.5 (K/uL)    RBC 4.32  3.87 - 5.11 (MIL/uL)    Hemoglobin 13.3  12.0 - 15.0 (g/dL)    HCT 09.6  04.5 - 40.9 (%)    MCV 91.0  78.0 - 100.0 (fL)    MCH 30.8  26.0 - 34.0 (pg)    MCHC 33.8  30.0 - 36.0 (g/dL)    RDW 81.1  91.4 - 78.2 (%)    Platelets 284  150 - 400 (K/uL)   PROTIME-INR     Status: Normal   Collection Time   06/02/11  8:21 AM      Component Value Range Comment   Prothrombin Time 13.0  11.6 - 15.2 (seconds)    INR 0.96  0.00 - 1.49     No results found.  Review of Systems  Constitutional: Negative for fever and chills.  Respiratory: Negative for cough and shortness of breath.   Cardiovascular: Negative for chest pain.  Gastrointestinal: Negative for nausea, vomiting and abdominal pain.  Neurological: Positive for headaches.  Endo/Heme/Allergies: Does not bruise/bleed easily.    Blood pressure 136/77, pulse 91, temperature 98.4 F (36.9 C), temperature source Oral, resp. rate 18, height 5' 6.75" (1.695 m), weight 213 lb (96.616 kg), SpO2 95.00%. Physical Exam  Constitutional: She is oriented to person, place, and time. She appears well-developed and well-nourished.  Cardiovascular: Normal rate and regular rhythm.   Respiratory: Effort normal and breath sounds normal.  GI: Soft. Bowel sounds are normal.  Musculoskeletal: Normal range of motion.  Neurological: She is alert and oriented to person, place, and time.  Psychiatric: She has a normal mood and affect.     Assessment/Plan: Patient with history  of anterior mediastinal mass/ left thyroid nodule; plan is for US/CT guided biopsy of one or both of the above masses. ALLRED,D KEVIN 06/02/2011, 9:09 AM

## 2011-06-02 NOTE — Procedures (Signed)
[place] lt substernal thyroid mass 20g FNA x 2 No comp Stable Prelim neg for malignancy.  Lymphocytic thyroiditis

## 2011-09-10 ENCOUNTER — Other Ambulatory Visit: Payer: Self-pay

## 2011-09-10 ENCOUNTER — Other Ambulatory Visit: Payer: Self-pay | Admitting: Cardiovascular Disease

## 2011-09-10 MED ORDER — EZETIMIBE 10 MG PO TABS: 10.0000 mg | ORAL_TABLET | Freq: Every day | ORAL | Status: DC

## 2011-09-10 NOTE — Telephone Encounter (Signed)
New refill  Pt is completely out and needs this asap

## 2011-12-11 ENCOUNTER — Other Ambulatory Visit: Payer: Self-pay | Admitting: *Deleted

## 2011-12-11 MED ORDER — EZETIMIBE 10 MG PO TABS: 10.0000 mg | ORAL_TABLET | Freq: Every day | ORAL | Status: DC

## 2011-12-11 NOTE — Telephone Encounter (Signed)
Pt needs appointment then refill can be made Fax Received. Refill Completed. [name] (R.M.A)   

## 2012-03-31 ENCOUNTER — Encounter: Payer: Self-pay | Admitting: Gastroenterology

## 2012-03-31 ENCOUNTER — Other Ambulatory Visit: Payer: Self-pay | Admitting: Internal Medicine

## 2012-03-31 ENCOUNTER — Ambulatory Visit
Admission: RE | Admit: 2012-03-31 | Discharge: 2012-03-31 | Disposition: A | Discharge status: Home / Self Care | Payer: BC Managed Care – PPO | Source: Ambulatory Visit | Attending: Internal Medicine | Admitting: Internal Medicine

## 2012-03-31 DIAGNOSIS — E049 Nontoxic goiter, unspecified: Secondary | ICD-10-CM

## 2012-04-05 ENCOUNTER — Encounter: Payer: Self-pay | Admitting: Gastroenterology

## 2012-04-05 ENCOUNTER — Other Ambulatory Visit: Payer: Self-pay | Admitting: Cardiovascular Disease

## 2012-04-05 ENCOUNTER — Other Ambulatory Visit (INDEPENDENT_AMBULATORY_CARE_PROVIDER_SITE_OTHER): Payer: BC Managed Care – PPO

## 2012-04-05 ENCOUNTER — Ambulatory Visit (INDEPENDENT_AMBULATORY_CARE_PROVIDER_SITE_OTHER): Payer: BC Managed Care – PPO | Admitting: Gastroenterology

## 2012-04-05 VITALS — BP 122/80 | HR 80 | Ht 67.0 in | Wt 221.4 lb

## 2012-04-05 DIAGNOSIS — R152 Fecal urgency: Secondary | ICD-10-CM

## 2012-04-05 DIAGNOSIS — R109 Unspecified abdominal pain: Secondary | ICD-10-CM

## 2012-04-05 DIAGNOSIS — F1120 Opioid dependence, uncomplicated: Secondary | ICD-10-CM

## 2012-04-05 DIAGNOSIS — R140 Abdominal distension (gaseous): Secondary | ICD-10-CM

## 2012-04-05 DIAGNOSIS — F1920 Other psychoactive substance dependence, uncomplicated: Secondary | ICD-10-CM

## 2012-04-05 DIAGNOSIS — R6889 Other general symptoms and signs: Secondary | ICD-10-CM

## 2012-04-05 LAB — SEDIMENTATION RATE: Sed Rate: 20 mm/hr (ref 0–22)

## 2012-04-05 MED ORDER — ALIGN PO CAPS: 1.0000 | ORAL_CAPSULE | Freq: Every day | ORAL | Status: DC

## 2012-04-05 MED ORDER — HYOSCYAMINE SULFATE 0.125 MG SL SUBL: 0.1250 mg | SUBLINGUAL_TABLET | SUBLINGUAL | Status: DC | PRN

## 2012-04-05 MED ORDER — PEG-KCL-NACL-NASULF-NA ASC-C 100 G PO SOLR: 1.0000 | Freq: Once | ORAL | Status: DC

## 2012-04-05 MED ORDER — MESALAMINE 1.2 G PO TBEC: 1200.0000 mg | DELAYED_RELEASE_TABLET | Freq: Every day | ORAL | Status: DC

## 2012-04-05 NOTE — Patient Instructions (Addendum)
Your physician has requested that you go to the basement for the following lab work before leaving today. You have been scheduled for a colonoscopy with propofol. Please follow written instructions given to you at your visit today.  Please pick up your prep kit at the pharmacy within the next 1-3 days. If you use inhalers (even only as needed), please bring them with you on the day of your procedure. We have given you samples of the following medication to take: Lialda, Levsin. We have given you samples of Align. This puts good bacteria back into your colon. You should take 1 capsule by mouth once daily. If this works well for you, it can be purchased over the counter. You have also been given the FODMAP diet to follow.  CC: Johny Blamer, M. D.

## 2012-04-05 NOTE — Addendum Note (Signed)
Addended by: Venora Maples on: 04/05/2012 02:25 PM   Modules accepted: Orders

## 2012-04-05 NOTE — Progress Notes (Signed)
History of Present Illness:  This is a 61 year old Caucasian female who presents with several months of abdominal gas, bloating, distention, and urgency. She's had no true diarrhea. but does have rather marked crampy lower abdominal pain. All of her problems seem to have arisen after a viral gastroenteritis this past summer. I previously saw her in 2009 at which time colonoscopy revealed NSAID induced colonic ulcerations. She was placed on by mouth aminosalicylate with good response, but was lost to followup.  The patient had a knee replacement last September and was on multiple antibiotics,but denies use of antibiotics in the last 6 months. She has a problem with her weight, and has been on a high fiber diet which may be contributing to her gas and bloating. Recently she tried to remove gluten from her diet, and she reports some improvement. There is been no rectal bleeding, upper GI or hepatobiliary symptoms. Gallbladder is removed in 1995. Because of her knee pain she is on frequent Percocet, Elavil, Flexeril, and Xanax. Family history is noncontributory.  I have reviewed this patient's present history, medical and surgical past history, allergies and medications.     ROS: The remainder of the 10 point ROS is negative... no visual difficulties, oral stomatitis, other swollen joints, fever, chills, or other systemic complaints. She gives a history of multiple drug allergies listed and reviewed.   Allergies  Allergen Reactions  . Amoxicillin     REACTION: Hives  . Clarithromycin     REACTION: Hives  . Crestor (Rosuvastatin Calcium)     ARM PAIN  . Doxycycline     REACTION: Hives  . Penicillins     REACTION: Hives  . Pravachol     ARM PAIN  . Topamax (Topiramate) Anxiety   Outpatient Prescriptions Prior to Visit  Medication Sig Dispense Refill  . ALPRAZolam (XANAX) 0.5 MG tablet Take 0.5 mg by mouth at bedtime as needed. Anxiety, sleep      . amitriptyline (ELAVIL) 50 MG tablet Take 50  mg by mouth at bedtime.       . Cyclobenzaprine HCl (FLEXERIL PO) Take 5 mg by mouth at bedtime.       Marland Kitchen ezetimibe (ZETIA) 10 MG tablet Take 1 tablet (10 mg total) by mouth daily.  30 tablet  2  . metoprolol (TOPROL-XL) 100 MG 24 hr tablet Take 100 mg by mouth at bedtime.       . Multiple Vitamin (MULTIVITAMIN) tablet Take 1 tablet by mouth daily.       . propranolol (INDERAL) 10 MG tablet Take 10 mg by mouth daily as needed. For svt episodes       . rizatriptan (MAXALT) 10 MG tablet Take 10 mg by mouth daily as needed. May repeat in 2 hours if needed. For migraines      . spironolactone (ALDACTONE) 50 MG tablet Take 50 mg by mouth daily.       . Calcium Carbonate-Vitamin D (CALCIUM + D PO) Take 1 tablet by mouth every morning.       . fish oil-omega-3 fatty acids 1000 MG capsule Take 1 g by mouth 2 (two) times daily.       Marland Kitchen GLUCOSAMINE HCL PO Take 1 tablet by mouth 2 (two) times daily.       Chilton Si Tea, Camillia sinensis, (GREEN TEA PO) Take 1-2 tablets by mouth as directed.       . Pomegranate, Punica granatum, (POMEGRANATE EXTRACT PO) Take 2 tablets by mouth 2 (two) times daily.       Marland Kitchen  Cetirizine HCl (ZYRTEC ALLERGY PO) Take 1 tablet by mouth daily as needed. For seasonal allergies      . pentosan polysulfate (ELMIRON) 100 MG capsule Take 100-200 mg by mouth 2 (two) times daily. Patient takes 2 tabs in morning and 1 tab in the evening      . traMADol (ULTRAM) 50 MG tablet Take 50 mg by mouth every 6 (six) hours as needed. pain       Past Medical History  Diagnosis Date  . Chest pain   . Hypertension   . SVT (supraventricular tachycardia)   . SOB (shortness of breath)   . Migraine headache   . Hyperlipidemia   . Chest tightness   . Ulcerative colitis    Past Surgical History  Procedure Date  . Appendectomy   . Cholecystectomy   . Foot surgery   . Nose surgery   . Doppler echocardiography 02/11/2000    EF 60-65%  . Cardiovascular stress test 11/05/2003    EF 68%   History    Social History  . Marital Status: Married    Spouse Name: N/A    Number of Children: 3  . Years of Education: N/A   Occupational History  . Accountant    Social History Main Topics  . Smoking status: Never Smoker   . Smokeless tobacco: Never Used  . Alcohol Use: No  . Drug Use: No  . Sexually Active: None   Other Topics Concern  . None   Social History Narrative  . None   Family History  Problem Relation Age of Onset  . Myocarditis Mother   . Aneurysm Father   . Breast cancer Paternal Aunt        Physical Exam: Healthy-appearing patient in no distress. Blood pressure 122/80, pulse 80 and regular, and weight 221 pounds with a BMI of 34.68. General well developed well nourished patient in no acute distress, appearing her stated age Eyes PERRLA, no icterus, fundoscopic exam per opthamologist Skin no lesions noted Neck supple, no adenopathy, no thyroid enlargement, no tenderness Chest clear to percussion and auscultation Heart no significant murmurs, gallops or rubs noted Abdomen no hepatosplenomegaly masses or tenderness, BS normal.  Rectal inspection normal no fissures, or fistulae noted.  No masses or tenderness on digital exam. Stool guaiac negative. Stool her rectal vault is very hard and certainly is not diarrhea in consistency. Extremities no acute joint lesions, edema, phlebitis or evidence of cellulitis. Neurologic patient oriented x 3, cranial nerves intact, no focal neurologic deficits noted. Psychological mental status normal and normal affect.  Assessment and plan: Probable post viral GI motility disturbance with possible element of bacterial overgrowth syndrome, rule out recurrence of inflammatory bowel disease, celiac disease, versus severe IBS. I have ordered celiac panel, sedimentation rate, CRP, and serum carotene. Colonoscopy scheduled her convenience. In the interim, I placed her on Lialda 2.4 g a day empirically,FODMAP- IBS diet, and when necessary  sublingual Levsin. The patient relates that she does not use NSAIDs since her previous illness. Medications as per primary care as listed and reviewed. The patient only previous surgery has been a hysterectomy, and she apparently has yearly normal GYN exams.   Please copy her primary care physician, referring physician, and pertinent subspecialists.    No diagnosis found.

## 2012-04-05 NOTE — Telephone Encounter (Signed)
Pt needs appointment then refill can be made Fax Received. Refill Completed. [name] (R.M.A)   

## 2012-04-06 LAB — CELIAC PANEL 10
Endomysial Screen: NEGATIVE
Gliadin IgA: 2.6 U/mL (ref <20)
Gliadin IgG: 2.6 U/mL (ref <20)
IgA: 254 mg/dL (ref 69–380)
Tissue Transglut Ab: 4.2 U/mL (ref <20)
Tissue Transglutaminase Ab, IgA: 3.8 U/mL (ref <20)

## 2012-04-06 LAB — C-REACTIVE PROTEIN: CRP: <0.5 mg/dL (ref 0.5–20.0)

## 2012-04-08 LAB — CAROTENE, SERUM: Carotene, Total-Serum: 53 ug/dL (ref 6–77)

## 2012-04-12 ENCOUNTER — Encounter: Payer: Self-pay | Admitting: Gastroenterology

## 2012-04-13 NOTE — Telephone Encounter (Signed)
Patient notified labs all o.k. And she is improved with her symptoms. I told her if Dr. Jarold Motto needed any further follow up we would contact her after he returns on 04-22-12.

## 2012-04-24 ENCOUNTER — Encounter: Payer: Self-pay | Admitting: Gastroenterology

## 2012-04-25 NOTE — Telephone Encounter (Signed)
All test results should be available immediately, if you are having trouble viewing those results please call [phone].

## 2012-04-27 ENCOUNTER — Ambulatory Visit (AMBULATORY_SURGERY_CENTER): Payer: BC Managed Care – PPO | Admitting: Gastroenterology

## 2012-04-27 ENCOUNTER — Encounter: Payer: Self-pay | Admitting: Gastroenterology

## 2012-04-27 VITALS — BP 132/74 | HR 93 | Temp 97.4°F | Resp 19 | Ht 67.0 in | Wt 221.0 lb

## 2012-04-27 DIAGNOSIS — R109 Unspecified abdominal pain: Secondary | ICD-10-CM

## 2012-04-27 DIAGNOSIS — Z1211 Encounter for screening for malignant neoplasm of colon: Secondary | ICD-10-CM

## 2012-04-27 MED ORDER — SODIUM CHLORIDE 0.9 % IV SOLN: 500.0000 mL | INTRAVENOUS | Status: DC

## 2012-04-27 NOTE — Op Note (Signed)
[facility] [address] [place], [address]   COLONOSCOPY PROCEDURE REPORT  PATIENT: Rachael, Gomez  MR#: [id number] BIRTHDATE: Jun 24, 1951 , 61  yrs. old GENDER: Female ENDOSCOPIST: Mardella Layman, MD, [facility] REFERRED BY: PROCEDURE DATE:  04/27/2012 PROCEDURE:   Colonoscopy, screening ASA CLASS:   Class II INDICATIONS:average risk patient for colon cancer. MEDICATIONS: propofol (Diprivan) 200mg  IV  DESCRIPTION OF PROCEDURE:   After the risks and benefits and of the procedure were explained, informed consent was obtained.  A digital rectal exam revealed no abnormalities of the rectum.    The LB [id number]  endoscope was introduced through the anus and advanced to the terminal ileum which was intubated for a short distance .  The quality of the prep was excellent, using MoviPrep . The instrument was then slowly withdrawn as the colon was fully examined.     COLON FINDINGS: A normal appearing cecum, ileocecal valve, and appendiceal orifice were identified.  The ascending, hepatic flexure, transverse, splenic flexure, descending, sigmoid colon and rectum appeared unremarkable.  No polyps or cancers were seen. The colon mucosa was otherwise normal. termional ileum appers normal also.s    Retroflexed views revealed no abnormalities. The scope was then withdrawn from the patient and the procedure completed.  COMPLICATIONS: There were no complications. ENDOSCOPIC IMPRESSION: 1.   Normal colon,nopolyps or cancer. 2.   The colon mucosa was otherwise normal ,no evidence of IBD.  RECOMMENDATIONS: 1.  continue current medications 2.  Continue current colorectal screening recommendations for "routine risk" patients with a repeat colonoscopy in 10 years.   REPEAT EXAM:  UU:VOZDGU, Chrissie Noa MD  _______________________________ eSigned:  Mardella Layman, MD, [facility] 04/27/2012 1:58 PM

## 2012-04-27 NOTE — Patient Instructions (Addendum)
Discharge instructions given with verbal understanding. Normal exam. Resume previous medications. YOU HAD AN ENDOSCOPIC PROCEDURE TODAY AT THE [facility]: Refer to the procedure report that was given to you for any specific questions about what was found during the examination.  If the procedure report does not answer your questions, please call your gastroenterologist to clarify.  If you requested that your care partner not be given the details of your procedure findings, then the procedure report has been included in a sealed envelope for you to review at your convenience later.  YOU SHOULD EXPECT: Some feelings of bloating in the abdomen. Passage of more gas than usual.  Walking can help get rid of the air that was put into your GI tract during the procedure and reduce the bloating. If you had a lower endoscopy (such as a colonoscopy or flexible sigmoidoscopy) you may notice spotting of blood in your stool or on the toilet paper. If you underwent a bowel prep for your procedure, then you may not have a normal bowel movement for a few days.  DIET: Your first meal following the procedure should be a light meal and then it is ok to progress to your normal diet.  A half-sandwich or bowl of soup is an example of a good first meal.  Heavy or fried foods are harder to digest and may make you feel nauseous or bloated.  Likewise meals heavy in dairy and vegetables can cause extra gas to form and this can also increase the bloating.  Drink plenty of fluids but you should avoid alcoholic beverages for 24 hours.  ACTIVITY: Your care partner should take you home directly after the procedure.  You should plan to take it easy, moving slowly for the rest of the day.  You can resume normal activity the day after the procedure however you should NOT DRIVE or use heavy machinery for 24 hours (because of the sedation medicines used during the test).    SYMPTOMS TO REPORT IMMEDIATELY: A gastroenterologist  can be reached at any hour.  During normal business hours, 8:30 AM to 5:00 PM Monday through Friday, call [phone].  After hours and on weekends, please call the GI answering service at [phone] who will take a message and have the physician on call contact you.   Following lower endoscopy (colonoscopy or flexible sigmoidoscopy):  Excessive amounts of blood in the stool  Significant tenderness or worsening of abdominal pains  Swelling of the abdomen that is new, acute  Fever of 100F or higher  FOLLOW UP: If any biopsies were taken you will be contacted by phone or by letter within the next 1-3 weeks.  Call your gastroenterologist if you have not heard about the biopsies in 3 weeks.  Our staff will call the home number listed on your records the next business day following your procedure to check on you and address any questions or concerns that you may have at that time regarding the information given to you following your procedure. This is a courtesy call and so if there is no answer at the home number and we have not heard from you through the emergency physician on call, we will assume that you have returned to your regular daily activities without incident.  SIGNATURES/CONFIDENTIALITY: You and/or your care partner have signed paperwork which will be entered into your electronic medical record.  These signatures attest to the fact that that the information above on your After Visit Summary has been reviewed   and is understood.  Full responsibility of the confidentiality of this discharge information lies with you and/or your care-partner. 

## 2012-04-27 NOTE — Progress Notes (Signed)
Patient did not experience any of the following events: a burn prior to discharge; a fall within the facility; wrong site/side/patient/procedure/implant event; or a hospital transfer or hospital admission upon discharge from the facility. (G8907) Patient did not have preoperative order for IV antibiotic SSI prophylaxis. (G8918)  

## 2012-04-28 ENCOUNTER — Telehealth: Payer: Self-pay | Admitting: *Deleted

## 2012-04-28 NOTE — Telephone Encounter (Signed)
  Follow up Call-  Call back number 04/27/2012  Post procedure Call Back phone  # [phone]  Permission to leave phone message Yes     Left message on answering machine to call us back if she is experiencing any problems or has questions

## 2012-06-06 ENCOUNTER — Other Ambulatory Visit: Payer: Self-pay | Admitting: *Deleted

## 2012-06-06 MED ORDER — EZETIMIBE 10 MG PO TABS: 10.0000 mg | ORAL_TABLET | Freq: Every day | ORAL | Status: DC

## 2012-06-06 NOTE — Telephone Encounter (Signed)
Pt needs appointment then refill can be made Fax Received. Refill Completed. [name] (R.M.A)   

## 2012-06-21 ENCOUNTER — Ambulatory Visit (INDEPENDENT_AMBULATORY_CARE_PROVIDER_SITE_OTHER): Payer: BC Managed Care – PPO | Admitting: Gastroenterology

## 2012-06-21 ENCOUNTER — Encounter: Payer: Self-pay | Admitting: Gastroenterology

## 2012-06-21 VITALS — BP 112/74 | HR 91 | Ht 66.75 in | Wt 230.5 lb

## 2012-06-21 DIAGNOSIS — K589 Irritable bowel syndrome without diarrhea: Secondary | ICD-10-CM

## 2012-06-21 NOTE — Progress Notes (Signed)
This is a  61 year old Caucasian female who recently had a post viral GI illness which seems to have resolved on Lialda 2.4 g a day and probiotic therapy.  Recent colonoscopy was entirely unremarkable.  She denies any GI complaints at this time and is having regular bowel movements without melena or hematochezia.  She also denies any hepatobiliary or systemic complaints.    Current Medications, Allergies, Past Medical History, Past Surgical History, Family History and Social History were reviewed in [employer] record.  Pertinent Review of Systems Negative   Physical Exam: The patient in no distress appearing her stated age.  Pressure 112/74, pulse 91 and regular, and weight 230 pounds with a BMI of 36.37.  I cannot appreciate hepatosplenomegaly, abdominal masses or tenderness.  Bowel sounds are normal.  There is no evidence of distention.  Mental status is normal.    Assessment and Plan: Post viral-induced irritable bowel syndrome which seems to be doing well at this time.  We will discontinue her probiotics and Lialda and see how she does symptomatically.  She does have Levsin to use when necessary for abdominal cramps and spasm.  Colonoscopy did not show any evidence of underlying inflammatory bowel disease. No diagnosis found.

## 2012-06-21 NOTE — Patient Instructions (Addendum)
Per Dr. Jarold Motto please stop Align and Lialda.  Please follow up in one year.

## 2012-07-24 ENCOUNTER — Other Ambulatory Visit: Payer: Self-pay | Admitting: Cardiovascular Disease

## 2012-07-24 DIAGNOSIS — E785 Hyperlipidemia, unspecified: Secondary | ICD-10-CM

## 2012-07-25 MED ORDER — EZETIMIBE 10 MG PO TABS: 10.0000 mg | ORAL_TABLET | Freq: Every day | ORAL | Status: DC

## 2012-07-25 NOTE — Telephone Encounter (Signed)
App made, refill completed, pt will have fasting labs at app.

## 2012-08-27 ENCOUNTER — Other Ambulatory Visit: Payer: Self-pay

## 2012-09-06 ENCOUNTER — Ambulatory Visit (INDEPENDENT_AMBULATORY_CARE_PROVIDER_SITE_OTHER): Payer: BC Managed Care – PPO | Admitting: Cardiovascular Disease

## 2012-09-06 ENCOUNTER — Encounter: Payer: Self-pay | Admitting: Cardiovascular Disease

## 2012-09-06 ENCOUNTER — Other Ambulatory Visit (INDEPENDENT_AMBULATORY_CARE_PROVIDER_SITE_OTHER): Payer: BC Managed Care – PPO

## 2012-09-06 VITALS — BP 137/83 | HR 82 | Ht 66.0 in | Wt 231.8 lb

## 2012-09-06 DIAGNOSIS — R0989 Other specified symptoms and signs involving the circulatory and respiratory systems: Secondary | ICD-10-CM

## 2012-09-06 DIAGNOSIS — E785 Hyperlipidemia, unspecified: Secondary | ICD-10-CM

## 2012-09-06 DIAGNOSIS — I10 Essential (primary) hypertension: Secondary | ICD-10-CM

## 2012-09-06 LAB — HEPATIC FUNCTION PANEL
ALT: 37 U/L — ABNORMAL HIGH (ref 0–35)
AST: 34 U/L (ref 0–37)
Albumin: 4 g/dL (ref 3.5–5.2)
Alkaline Phosphatase: 87 U/L (ref 39–117)
Bilirubin, Direct: 0.1 mg/dL (ref 0.0–0.3)
Total Bilirubin: 0.9 mg/dL (ref 0.3–1.2)
Total Protein: 7.4 g/dL (ref 6.0–8.3)

## 2012-09-06 LAB — LIPID PANEL
Cholesterol: 218 mg/dL — ABNORMAL HIGH (ref 0–200)
HDL: 39.6 mg/dL (ref >39.00)
Total CHOL/HDL Ratio: 6
Triglycerides: 148 mg/dL (ref 0.0–149.0)
VLDL: 29.6 mg/dL (ref 0.0–40.0)

## 2012-09-06 LAB — BASIC METABOLIC PANEL
BUN: 17 mg/dL (ref 6–23)
CO2: 27 mEq/L (ref 19–32)
Calcium: 9 mg/dL (ref 8.4–10.5)
Chloride: 104 mEq/L (ref 96–112)
Creatinine, Ser: 0.8 mg/dL (ref 0.4–1.2)
GFR: 80.09 mL/min (ref >60.00)
Glucose, Bld: 101 mg/dL — ABNORMAL HIGH (ref 70–99)
Potassium: 4.1 mEq/L (ref 3.5–5.1)
Sodium: 138 mEq/L (ref 135–145)

## 2012-09-06 LAB — LDL CHOLESTEROL, DIRECT: Direct LDL: 162.2 mg/dL

## 2012-09-06 MED ORDER — EZETIMIBE 10 MG PO TABS: 10.0000 mg | ORAL_TABLET | Freq: Every day | ORAL | Status: DC

## 2012-09-06 NOTE — Patient Instructions (Addendum)
Your physician recommends that you return for a FASTING lipid profile: today and in 1 year   Your physician wants you to follow-up in: 1 year  You will receive a reminder letter in the mail two months in advance. If you don't receive a letter, please call our office to schedule the follow-up appointment.  Your physician recommends that you continue on your current medications as directed. Please refer to the Current Medication list given to you today.  

## 2012-09-06 NOTE — Assessment & Plan Note (Signed)
[name]'s BP is well controlled.  I have encourage her to work on a good diet and exercise program.  I will see her again in 1 years.

## 2012-09-06 NOTE — Progress Notes (Signed)
[name] Date of Birth  Jul 22, 1950       [facility]    [place] [address]  [address] [place], [place]  [address]   [place], [place]  [address] [phone]     [phone]   Fax  [phone]    Fax [phone]  Problem List: 1. Hypertension 2. Supraventricular tachycardia  History of Present Illness:  [name] is a 62 yo with hx as noted above.  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    she had one episode of supraventricular tachycardia last year that the propranolol with resolution of the episode.   Current Outpatient Prescriptions on File Prior to Visit  Medication Sig Dispense Refill  . ALPRAZolam (XANAX) 0.5 MG tablet Take 0.5 mg by mouth at bedtime as needed. Anxiety, sleep      . amitriptyline (ELAVIL) 50 MG tablet Take 50 mg by mouth at bedtime.       . clindamycin (CLEOCIN) 150 MG capsule as needed.       . Cyclobenzaprine HCl (FLEXERIL PO) Take 5 mg by mouth at bedtime.       Marland Kitchen ezetimibe (ZETIA) 10 MG tablet Take 1 tablet (10 mg total) by mouth daily.  30 tablet  1  . hyoscyamine (LEVSIN SL) 0.125 MG SL tablet Place 1 tablet (0.125 mg total) under the tongue every 4 (four) hours as needed for cramping.  30 tablet  6  . metoprolol (TOPROL-XL) 100 MG 24 hr tablet Take 100 mg by mouth at bedtime.       Marland Kitchen oxyCODONE-acetaminophen (PERCOCET) 10-325 MG per tablet Take 1 tablet by mouth every 4 (four) hours as needed.      . propranolol (INDERAL) 10 MG tablet Take 10 mg by mouth daily as needed. For svt episodes       . rizatriptan (MAXALT) 10 MG tablet Take 10 mg by mouth daily as needed. May repeat in 2 hours if needed. For migraines      . spironolactone (ALDACTONE) 50 MG tablet Take 50 mg by mouth daily.        No current facility-administered medications on file prior to visit.    Allergies  Allergen Reactions  . Amoxicillin     REACTION: Hives  . Biaxin (Clarithromycin)    REACTION: Hives  . Crestor (Rosuvastatin Calcium)     ARM PAIN  . Doxycycline     REACTION: Hives  . Penicillins     REACTION: Hives  . Pravachol     ARM PAIN  . Topamax (Topiramate) Anxiety    Past Medical History  Diagnosis Date  . Chest pain   . Hypertension   . SVT (supraventricular tachycardia)   . SOB (shortness of breath)   . Migraine headache   . Hyperlipidemia   . Chest tightness   . Ulcerative colitis   . Depression   . Arthritis   . Thyroid disease     goiter    Past Surgical History  Procedure Laterality Date  . Appendectomy    . Cholecystectomy    . Foot surgery    . Nose surgery    . Doppler echocardiography  02/11/2000    EF 60-65%  . Cardiovascular stress test  11/05/2003    EF 68%  . Total knee arthroplasty      right knee    History  Smoking status  . Never Smoker  Smokeless tobacco  . Never Used    History  Alcohol Use No    Family History  Problem Relation Age of Onset  . Myocarditis Mother   . Aneurysm Father   . Breast cancer Paternal Aunt     Reviw of Systems:  Reviewed in the HPI.  All other systems are negative.  Physical Exam: Blood pressure 137/83, pulse 82, height 5\' 6"  (1.676 m), weight 231 lb 12.8 oz (105.144 kg). General: Well developed, well nourished, in no acute distress.  Head: Normocephalic, atraumatic, sclera non-icteric, mucus membranes are moist,   Neck: Supple. Carotids are 2 + without bruits. No JVD   Lungs: Clear   Heart: RR, normal S1, S2  Abdomen: Soft, non-tender, non-distended with normal bowel sounds.  Msk:  Strength and tone are normal   Extremities: No clubbing or cyanosis. No edema.  Distal pedal pulses are 2+ and equal    Neuro: CN II - XII intact.  Alert and oriented X 3.   Psych:  Normal   ECG: Feb. 25, 2014:  NSR at 84. Normal ECG  Assessment / Plan:

## 2012-09-11 ENCOUNTER — Encounter: Payer: Self-pay | Admitting: Cardiovascular Disease

## 2012-09-12 ENCOUNTER — Telehealth: Payer: Self-pay | Admitting: *Deleted

## 2012-09-12 NOTE — Telephone Encounter (Signed)
Reviewed, zetia was already refilled

## 2012-09-13 ENCOUNTER — Encounter: Payer: Self-pay | Admitting: Cardiovascular Disease

## 2013-05-18 ENCOUNTER — Other Ambulatory Visit: Payer: Self-pay

## 2013-09-12 ENCOUNTER — Other Ambulatory Visit: Payer: Self-pay | Admitting: Cardiovascular Disease

## 2013-09-15 ENCOUNTER — Other Ambulatory Visit: Payer: Self-pay | Admitting: Internal Medicine

## 2013-09-15 DIAGNOSIS — E042 Nontoxic multinodular goiter: Secondary | ICD-10-CM

## 2013-09-19 ENCOUNTER — Ambulatory Visit
Admission: RE | Admit: 2013-09-19 | Discharge: 2013-09-19 | Disposition: A | Discharge status: Home / Self Care | Payer: BC Managed Care – PPO | Source: Ambulatory Visit | Attending: Internal Medicine | Admitting: Internal Medicine

## 2013-09-19 DIAGNOSIS — E042 Nontoxic multinodular goiter: Secondary | ICD-10-CM

## 2013-10-05 ENCOUNTER — Other Ambulatory Visit: Payer: Self-pay | Admitting: Internal Medicine

## 2013-10-05 DIAGNOSIS — E042 Nontoxic multinodular goiter: Secondary | ICD-10-CM

## 2013-10-10 ENCOUNTER — Other Ambulatory Visit (HOSPITAL_COMMUNITY)
Admission: RE | Admit: 2013-10-10 | Discharge: 2013-10-10 | Disposition: A | Discharge status: Home / Self Care | Payer: BC Managed Care – PPO | Source: Ambulatory Visit | Attending: Interventional Radiology | Admitting: Interventional Radiology

## 2013-10-10 ENCOUNTER — Ambulatory Visit
Admission: RE | Admit: 2013-10-10 | Discharge: 2013-10-10 | Disposition: A | Discharge status: Home / Self Care | Payer: BC Managed Care – PPO | Source: Ambulatory Visit | Attending: Internal Medicine | Admitting: Internal Medicine

## 2013-10-10 DIAGNOSIS — E041 Nontoxic single thyroid nodule: Secondary | ICD-10-CM | POA: Insufficient documentation

## 2013-10-10 DIAGNOSIS — E042 Nontoxic multinodular goiter: Secondary | ICD-10-CM

## 2013-10-27 ENCOUNTER — Other Ambulatory Visit: Payer: Self-pay | Admitting: *Deleted

## 2013-10-27 MED ORDER — EZETIMIBE 10 MG PO TABS: ORAL_TABLET | ORAL | Status: DC

## 2013-11-20 ENCOUNTER — Ambulatory Visit (INDEPENDENT_AMBULATORY_CARE_PROVIDER_SITE_OTHER): Payer: BC Managed Care – PPO | Admitting: *Deleted

## 2013-11-20 DIAGNOSIS — E785 Hyperlipidemia, unspecified: Secondary | ICD-10-CM

## 2013-11-20 DIAGNOSIS — I10 Essential (primary) hypertension: Secondary | ICD-10-CM

## 2013-11-20 LAB — HEPATIC FUNCTION PANEL
ALT: 38 U/L — ABNORMAL HIGH (ref 0–35)
AST: 32 U/L (ref 0–37)
Albumin: 3.9 g/dL (ref 3.5–5.2)
Alkaline Phosphatase: 65 U/L (ref 39–117)
Bilirubin, Direct: 0 mg/dL (ref 0.0–0.3)
Total Bilirubin: 0.7 mg/dL (ref 0.2–1.2)
Total Protein: 7.2 g/dL (ref 6.0–8.3)

## 2013-11-20 LAB — BASIC METABOLIC PANEL
BUN: 16 mg/dL (ref 6–23)
CO2: 26 mEq/L (ref 19–32)
Calcium: 9.1 mg/dL (ref 8.4–10.5)
Chloride: 105 mEq/L (ref 96–112)
Creatinine, Ser: 0.8 mg/dL (ref 0.4–1.2)
GFR: 73.23 mL/min (ref >60.00)
Glucose, Bld: 110 mg/dL — ABNORMAL HIGH (ref 70–99)
Potassium: 4.1 mEq/L (ref 3.5–5.1)
Sodium: 138 mEq/L (ref 135–145)

## 2013-11-20 LAB — LIPID PANEL
Cholesterol: 181 mg/dL (ref 0–200)
HDL: 41.3 mg/dL (ref >39.00)
LDL Cholesterol: 104 mg/dL — ABNORMAL HIGH (ref 0–99)
Total CHOL/HDL Ratio: 4
Triglycerides: 181 mg/dL — ABNORMAL HIGH (ref 0.0–149.0)
VLDL: 36.2 mg/dL (ref 0.0–40.0)

## 2013-11-22 ENCOUNTER — Ambulatory Visit (INDEPENDENT_AMBULATORY_CARE_PROVIDER_SITE_OTHER): Payer: BC Managed Care – PPO | Admitting: Cardiovascular Disease

## 2013-11-22 ENCOUNTER — Encounter: Payer: Self-pay | Admitting: Cardiovascular Disease

## 2013-11-22 VITALS — BP 128/90 | HR 78 | Ht 66.0 in | Wt 232.0 lb

## 2013-11-22 DIAGNOSIS — I10 Essential (primary) hypertension: Secondary | ICD-10-CM

## 2013-11-22 DIAGNOSIS — E785 Hyperlipidemia, unspecified: Secondary | ICD-10-CM

## 2013-11-22 MED ORDER — EZETIMIBE 10 MG PO TABS: ORAL_TABLET | ORAL | Status: DC

## 2013-11-22 NOTE — Patient Instructions (Signed)
Your physician recommends that you continue on your current medications as directed. Please refer to the Current Medication list given to you today.  Your physician wants you to follow-up in: 1 year with Dr. Acie Fredrickson.  You will receive a reminder letter in the mail two months in advance. If you don't receive a letter, please call our office to schedule the follow-up appointment.  Your physician recommends that you return for lab work in: 1 year a few days before or on the day of your office visit with Dr. Acie Fredrickson

## 2013-11-22 NOTE — Assessment & Plan Note (Signed)
Her BP is very controlled.    Continue current meds.   I've advised her to exercise on a regular basis.  I will see her in 1 year

## 2013-11-22 NOTE — Assessment & Plan Note (Signed)
Her lipid levels are improving. Her triglyceride level is still slightly elevated at 181. I've encouraged her to get back into a regular exercise program. She is to watch her carbs. We'll see her again in one year for repeat fasting lipid profile, liver enzymes, and basic metabolic profile

## 2013-11-22 NOTE — Progress Notes (Signed)
[name] Date of Birth  01-27-1951       [facility] [address] [place], [place], [place]  [address]   [place], [place]  [address] [phone]     [phone]   Fax  [phone]    Fax [phone]  Problem List: 1. Hypertension 2. Supraventricular tachycardia  History of Present Illness:  [name] is a 63 yo with hx as noted above.  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    she had one episode of supraventricular tachycardia last year that the propranolol with resolution of the episode.   Nov 22, 2013:  [name] is doing well.  No CP ,  No dyspnea.  BP has been well controlled.    Current Outpatient Prescriptions on File Prior to Visit  Medication Sig Dispense Refill  . ALPRAZolam (XANAX) 0.5 MG tablet Take 0.5 mg by mouth at bedtime as needed. Anxiety, sleep      . amitriptyline (ELAVIL) 50 MG tablet Take 50 mg by mouth at bedtime.       . clindamycin (CLEOCIN) 150 MG capsule as needed.       . Cyclobenzaprine HCl (FLEXERIL PO) Take 5 mg by mouth at bedtime.       Marland Kitchen ezetimibe (ZETIA) 10 MG tablet TAKE 1 TABLET (10 MG TOTAL) BY MOUTH DAILY.  30 tablet  0  . hyoscyamine (LEVSIN SL) 0.125 MG SL tablet Place 1 tablet (0.125 mg total) under the tongue every 4 (four) hours as needed for cramping.  30 tablet  6  . metoprolol (TOPROL-XL) 100 MG 24 hr tablet Take 100 mg by mouth at bedtime.       Marland Kitchen oxyCODONE-acetaminophen (PERCOCET) 10-325 MG per tablet Take 1 tablet by mouth every 4 (four) hours as needed.      . propranolol (INDERAL) 10 MG tablet Take 10 mg by mouth daily as needed. For svt episodes       . rizatriptan (MAXALT) 10 MG tablet Take 10 mg by mouth daily as needed. May repeat in 2 hours if needed. For migraines      . spironolactone (ALDACTONE) 50 MG tablet Take 50 mg by mouth daily.        No current facility-administered medications on file prior to visit.     Allergies  Allergen Reactions  . Amoxicillin     REACTION: Hives  . Biaxin [Clarithromycin]     REACTION: Hives  . Crestor [Rosuvastatin Calcium]     ARM PAIN  . Doxycycline     REACTION: Hives  . Penicillins     REACTION: Hives  . Pravachol     ARM PAIN  . Topamax [Topiramate] Anxiety    Past Medical History  Diagnosis Date  . Chest pain   . Hypertension   . SVT (supraventricular tachycardia)   . SOB (shortness of breath)   . Migraine headache   . Hyperlipidemia   . Chest tightness   . Ulcerative colitis   . Depression   . Arthritis   . Thyroid disease     goiter    Past Surgical History  Procedure Laterality Date  . Appendectomy    . Cholecystectomy    . Foot surgery    . Nose surgery    . Doppler echocardiography  02/11/2000    EF 60-65%  . Cardiovascular stress test  11/05/2003  EF 68%  . Total knee arthroplasty      right knee    History  Smoking status  . Never Smoker   Smokeless tobacco  . Never Used    History  Alcohol Use No    Family History  Problem Relation Age of Onset  . Myocarditis Mother   . Aneurysm Father   . Breast cancer Paternal Aunt     Reviw of Systems:  Reviewed in the HPI.  All other systems are negative.  Physical Exam: Blood pressure 128/90, pulse 78, height 5\' 6"  (1.676 m), weight 232 lb (105.235 kg). General: Well developed, well nourished, in no acute distress.  Head: Normocephalic, atraumatic, sclera non-icteric, mucus membranes are moist,   Neck: Supple. Carotids are 2 + without bruits. No JVD   Lungs: Clear   Heart: RR, normal S1, S2  Abdomen: Soft, non-tender, non-distended with normal bowel sounds.  Msk:  Strength and tone are normal   Extremities: No clubbing or cyanosis. No edema.  Distal pedal pulses are 2+ and equal    Neuro: CN II - XII intact.  Alert and oriented X 3.   Psych:  Normal   ECG: Feb. 25, 2014:  NSR at 84. Normal ECG  Assessment / Plan:

## 2014-10-09 ENCOUNTER — Other Ambulatory Visit: Payer: Self-pay | Admitting: Internal Medicine

## 2014-10-09 DIAGNOSIS — E042 Nontoxic multinodular goiter: Secondary | ICD-10-CM

## 2014-11-05 ENCOUNTER — Ambulatory Visit
Admission: RE | Admit: 2014-11-05 | Discharge: 2014-11-05 | Disposition: A | Discharge status: Home / Self Care | Payer: BLUE CROSS/BLUE SHIELD | Source: Ambulatory Visit | Attending: Internal Medicine | Admitting: Internal Medicine

## 2014-11-05 DIAGNOSIS — E042 Nontoxic multinodular goiter: Secondary | ICD-10-CM

## 2014-12-13 ENCOUNTER — Other Ambulatory Visit: Payer: Self-pay | Admitting: Cardiovascular Disease

## 2015-01-11 ENCOUNTER — Other Ambulatory Visit: Payer: Self-pay | Admitting: Cardiovascular Disease

## 2015-02-24 ENCOUNTER — Other Ambulatory Visit: Payer: Self-pay | Admitting: Cardiovascular Disease

## 2015-02-25 NOTE — Telephone Encounter (Signed)
Please advise on refill. Even after only receiving fifteen tablets, patient has still failed to schedule an appointment. Thanks, MI

## 2015-03-04 ENCOUNTER — Ambulatory Visit (INDEPENDENT_AMBULATORY_CARE_PROVIDER_SITE_OTHER): Payer: BLUE CROSS/BLUE SHIELD | Admitting: Cardiovascular Disease

## 2015-03-04 ENCOUNTER — Encounter: Payer: Self-pay | Admitting: Cardiovascular Disease

## 2015-03-04 VITALS — BP 136/74 | HR 71 | Ht 66.0 in | Wt 230.0 lb

## 2015-03-04 DIAGNOSIS — I10 Essential (primary) hypertension: Secondary | ICD-10-CM | POA: Diagnosis not present

## 2015-03-04 DIAGNOSIS — E785 Hyperlipidemia, unspecified: Secondary | ICD-10-CM | POA: Diagnosis not present

## 2015-03-04 DIAGNOSIS — I471 Supraventricular tachycardia: Secondary | ICD-10-CM | POA: Diagnosis not present

## 2015-03-04 MED ORDER — ATORVASTATIN CALCIUM 20 MG PO TABS: 20.0000 mg | ORAL_TABLET | Freq: Every day | ORAL | Status: DC

## 2015-03-04 NOTE — Progress Notes (Signed)
Fransico Gomez Date of Birth  Nov 30, 1950       [name] [address], [address], [place], [place]  [address]   [place], [place]  [address] [phone]     [phone]   Fax  [phone]    Fax [phone]  Problem List: 1. Hypertension 2. Supraventricular tachycardia 3. Hyperlipidemia   History of Present Illness:  [name] is a 64 yo with hx as noted above.  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    she had one episode of supraventricular tachycardia last year that the propranolol with resolution of the episode.   Nov 22, 2013:  [name] is doing well.  No CP ,  No dyspnea.  BP has been well controlled.    March 04, 2015:  Trying to stay active.  Had gained some weight and has lost most of it No CP , no dyspnea   Current Outpatient Prescriptions on File Prior to Visit  Medication Sig Dispense Refill  . ALPRAZolam (XANAX) 0.5 MG tablet Take 0.5 mg by mouth at bedtime as needed. Anxiety, sleep    . metoprolol (TOPROL-XL) 100 MG 24 hr tablet Take 100 mg by mouth at bedtime.     Marland Kitchen oxyCODONE-acetaminophen (PERCOCET) 10-325 MG per tablet Take 1 tablet by mouth every 4 (four) hours as needed.    . propranolol (INDERAL) 10 MG tablet Take 10 mg by mouth daily as needed. For svt episodes     . rizatriptan (MAXALT) 10 MG tablet Take 10 mg by mouth daily as needed. May repeat in 2 hours if needed. For migraines    . spironolactone (ALDACTONE) 50 MG tablet Take 50 mg by mouth daily.     Marland Kitchen ZETIA 10 MG tablet TAKE 1 TABLET BY MOUTH EVERY DAY 15 tablet 0   No current facility-administered medications on file prior to visit.    Allergies  Allergen Reactions  . Amoxicillin     REACTION: Hives  . Biaxin [Clarithromycin]     REACTION: Hives  . Crestor [Rosuvastatin Calcium]     ARM PAIN  . Doxycycline     REACTION: Hives  . Penicillins     REACTION: Hives  . Pravachol     ARM PAIN  .  Topamax [Topiramate] Anxiety    Past Medical History  Diagnosis Date  . Chest pain   . Hypertension   . SVT (supraventricular tachycardia)   . SOB (shortness of breath)   . Migraine headache   . Hyperlipidemia   . Chest tightness   . Ulcerative colitis   . Depression   . Arthritis   . Thyroid disease     goiter    Past Surgical History  Procedure Laterality Date  . Appendectomy    . Cholecystectomy    . Foot surgery    . Nose surgery    . Doppler echocardiography  02/11/2000    EF 60-65%  . Cardiovascular stress test  11/05/2003    EF 68%  . Total knee arthroplasty      right knee    History  Smoking status  . Never Smoker   Smokeless tobacco  . Never Used    History  Alcohol Use No    Family History  Problem Relation Age of Onset  . Myocarditis Mother   . Aneurysm Father   . Breast cancer Paternal Aunt  Reviw of Systems:  Reviewed in the HPI.  All other systems are negative.  Physical Exam: Blood pressure 136/74, pulse 71, height 5\' 6"  (1.676 m), weight 104.327 kg (230 lb). General: Well developed, well nourished, in no acute distress.  Head: Normocephalic, atraumatic, sclera non-icteric, mucus membranes are moist,   Neck: Supple. Carotids are 2 + without bruits. No JVD   Lungs: Clear   Heart: RR, normal S1, S2  Abdomen: Soft, non-tender, non-distended with normal bowel sounds.  Msk:  Strength and tone are normal   Extremities: No clubbing or cyanosis. No edema.  Distal pedal pulses are 2+ and equal    Neuro: CN II - XII intact.  Alert and oriented X 3.   Psych:  Normal   ECG: Aug. 22, 2016,  NSR at 71 no ST or T wave changes.   Assessment / Plan:   1. Hypertension - blood pressures fairly well-controlled. Continue current medications.  2. Supraventricular tachycardia - she's not had any recurrent episodes of SVT. Can continue current dose of metoprolol.  3. Hyperlipidemia - she stated that the Zetia was fairly expensive. We will  try her on atorvastatin. She does not recall that she tried  That in  the past. We'll check fasting lipids, liver enzymes, and basic metabolic profile in 3 months. I will see her again in one year.    [name], Rachael Cheng, MD  03/04/2015 5:20 PM    [place],  [place], [place]  [address] Pager [phone] Phone: [phone]; Fax: [phone]   [facility]  [address] [place], [place]  [address] [phone]   Fax [phone]

## 2015-03-04 NOTE — Patient Instructions (Signed)
Medication Instructions:  STOP Zetia START Atorvastatin 20 mg once daily  Labwork: Your physician recommends that you return for lab work in: 3 months for FASTING labs (cholesterol, liver, basic metabolic panel)   Testing/Procedures: None Ordered   Follow-Up: Your physician wants you to follow-up in: 1 year with Dr. Acie Fredrickson.  You will receive a reminder letter in the mail two months in advance. If you don't receive a letter, please call our office to schedule the follow-up appointment.

## 2015-05-27 ENCOUNTER — Other Ambulatory Visit (INDEPENDENT_AMBULATORY_CARE_PROVIDER_SITE_OTHER): Payer: BLUE CROSS/BLUE SHIELD | Admitting: *Deleted

## 2015-05-27 DIAGNOSIS — E785 Hyperlipidemia, unspecified: Secondary | ICD-10-CM

## 2015-05-27 DIAGNOSIS — I471 Supraventricular tachycardia: Secondary | ICD-10-CM

## 2015-05-27 LAB — HEPATIC FUNCTION PANEL
ALT: 28 U/L (ref 6–29)
AST: 29 U/L (ref 10–35)
Albumin: 3.9 g/dL (ref 3.6–5.1)
Alkaline Phosphatase: 75 U/L (ref 33–130)
Bilirubin, Direct: 0.1 mg/dL (ref <=0.2)
Indirect Bilirubin: 0.2 mg/dL (ref 0.2–1.2)
Total Bilirubin: 0.3 mg/dL (ref 0.2–1.2)
Total Protein: 7 g/dL (ref 6.1–8.1)

## 2015-05-27 LAB — BASIC METABOLIC PANEL
BUN: 14 mg/dL (ref 7–25)
CO2: 21 mmol/L (ref 20–31)
Calcium: 9.2 mg/dL (ref 8.6–10.4)
Chloride: 105 mmol/L (ref 98–110)
Creat: 0.82 mg/dL (ref 0.50–0.99)
Glucose, Bld: 108 mg/dL — ABNORMAL HIGH (ref 65–99)
Potassium: 4.4 mmol/L (ref 3.5–5.3)
Sodium: 138 mmol/L (ref 135–146)

## 2015-05-27 LAB — LIPID PANEL
Cholesterol: 160 mg/dL (ref 125–200)
HDL: 46 mg/dL (ref >=46)
LDL Cholesterol: 92 mg/dL (ref <130)
Total CHOL/HDL Ratio: 3.5 Ratio (ref <=5.0)
Triglycerides: 108 mg/dL (ref <150)
VLDL: 22 mg/dL (ref <30)

## 2015-07-02 ENCOUNTER — Other Ambulatory Visit: Payer: Self-pay | Admitting: Radiology

## 2015-10-28 ENCOUNTER — Encounter: Payer: Self-pay | Admitting: Gastroenterology

## 2016-03-10 ENCOUNTER — Telehealth: Payer: Self-pay | Admitting: Cardiovascular Disease

## 2016-03-10 ENCOUNTER — Emergency Department (HOSPITAL_COMMUNITY)
Admission: EM | Admit: 2016-03-10 | Discharge: 2016-03-10 | Disposition: A | Discharge status: Home / Self Care | Payer: BLUE CROSS/BLUE SHIELD | Attending: Emergency Medicine | Admitting: Emergency Medicine

## 2016-03-10 ENCOUNTER — Emergency Department (HOSPITAL_COMMUNITY): Payer: BLUE CROSS/BLUE SHIELD

## 2016-03-10 ENCOUNTER — Encounter (HOSPITAL_COMMUNITY): Payer: Self-pay

## 2016-03-10 DIAGNOSIS — R079 Chest pain, unspecified: Secondary | ICD-10-CM | POA: Diagnosis not present

## 2016-03-10 DIAGNOSIS — I10 Essential (primary) hypertension: Secondary | ICD-10-CM | POA: Diagnosis not present

## 2016-03-10 DIAGNOSIS — Z96651 Presence of right artificial knee joint: Secondary | ICD-10-CM | POA: Insufficient documentation

## 2016-03-10 DIAGNOSIS — Z79899 Other long term (current) drug therapy: Secondary | ICD-10-CM | POA: Diagnosis not present

## 2016-03-10 LAB — CBC
HCT: 48.2 % — ABNORMAL HIGH (ref 36.0–46.0)
Hemoglobin: 15.9 g/dL — ABNORMAL HIGH (ref 12.0–15.0)
MCH: 31.7 pg (ref 26.0–34.0)
MCHC: 33 g/dL (ref 30.0–36.0)
MCV: 96.2 fL (ref 78.0–100.0)
Platelets: 262 10*3/uL (ref 150–400)
RBC: 5.01 MIL/uL (ref 3.87–5.11)
RDW: 12.7 % (ref 11.5–15.5)
WBC: 8.1 10*3/uL (ref 4.0–10.5)

## 2016-03-10 LAB — BASIC METABOLIC PANEL
Anion gap: 8 (ref 5–15)
BUN: 11 mg/dL (ref 6–20)
CO2: 25 mmol/L (ref 22–32)
Calcium: 9.8 mg/dL (ref 8.9–10.3)
Chloride: 105 mmol/L (ref 101–111)
Creatinine, Ser: 0.93 mg/dL (ref 0.44–1.00)
GFR calc Af Amer: >60 mL/min (ref >60)
GFR calc non Af Amer: >60 mL/min (ref >60)
Glucose, Bld: 170 mg/dL — ABNORMAL HIGH (ref 65–99)
Potassium: 4.2 mmol/L (ref 3.5–5.1)
Sodium: 138 mmol/L (ref 135–145)

## 2016-03-10 LAB — I-STAT TROPONIN, ED: Troponin i, poc: 0 ng/mL (ref 0.00–0.08)

## 2016-03-10 NOTE — Telephone Encounter (Signed)
Agree with the plan to refer her to the [facility] ER

## 2016-03-10 NOTE — Telephone Encounter (Signed)
Received call transferred from operator and spoke with pt. She reports she is currently having chest heaviness. Pt states she is currently in our parking lot. I told pt she needed to go to ED at [facility].  I offered to call EMS for pt but she does not want me to do this and stated she "thinks she is fine but didn't know what to do." I again instructed pt she needed to go to [facility] ED.  I told pt I would make hospital aware.  Trish notified.

## 2016-03-10 NOTE — Telephone Encounter (Signed)
New message        Pt is experiencing chest pain.    Pt c/o of Chest Pain: STAT if CP now or developed within 24 hours  1. Are you having CP right now? yes 2. Are you experiencing any other symptoms (ex. SOB, nausea, vomiting, sweating)? Heart feels heavy  3. How long have you been experiencing CP? Since this morning  4. Is your CP continuous or coming and going? continuous  5. Have you taken Nitroglycerin? no?

## 2016-03-10 NOTE — ED Provider Notes (Signed)
[facility] DEPT Provider Note   CSN: [id number] Arrival date & time: 03/10/16  1409  By signing my name below, I, Reola Mosher, attest that this documentation has been prepared under the direction and in the presence of Virgel Manifold, MD. Electronically Signed: Reola Mosher, ED Scribe. 03/10/16. 7:27 PM.  History   Chief Complaint Chief Complaint  Patient presents with  . Chest Pain   The history is provided by the patient and medical records. No language interpreter was used.   HPI Comments: [name] is a 65 y.o. female with a PMHx of HLD, HTN, SVT (on Toprol), who presents to the Emergency Department complaining of sudden onset, unchanged, constant, non-radiating, 8/10 middle chest pain onset this AM. Pt reports that she woke up with her pain this morning, and she describes it as like someone is sitting on her chest. She denies her pain waking her up from sleeping. There are no alleviating or exacerbating factors, and her symptoms are specifically not worsened w/ exertional activities. No treatments were tried PTA. She reports associated nausea, and mild SOB secondary to her pain. Pt attempted to be seen at her Cardiologist's office (Dr Acie Fredrickson) prior to coming into the ED; however they would not see her and told her to come to the ER. She is followed by a Cardiologist for her hx of SVT. Pt has had a stress test in the past that was normal. No hx of GERD or ulcers. Denies vomiting, leg swelling, fevers, chills, coughing, or any other associated symptoms.    Past Medical History:  Diagnosis Date  . Arthritis   . Chest pain   . Chest tightness   . Depression   . Hyperlipidemia   . Hypertension   . Migraine headache   . SOB (shortness of breath)   . SVT (supraventricular tachycardia) ([place])   . Thyroid disease    goiter  . Ulcerative colitis [facility])    Patient Active Problem List   Diagnosis Date Noted  . Hypertension 02/05/2011  . INTERSTITIAL CYSTITIS  03/27/2008  . FIBROMYALGIA 03/27/2008  . PANCREATITIS, HX OF 03/27/2008  . Hyperlipidemia 03/26/2008  . SVT (supraventricular tachycardia) ([place]) 03/26/2008  . OTHER ULCERATIVE COLITIS 03/26/2008  . HEMATOCHEZIA 03/26/2008  . CYSTITIS 03/26/2008  . OSTEOARTHRITIS 03/26/2008  . DIARRHEA 03/26/2008   Past Surgical History:  Procedure Laterality Date  . APPENDECTOMY    . CARDIOVASCULAR STRESS TEST  11/05/2003   EF 68%  . CHOLECYSTECTOMY    . DOPPLER ECHOCARDIOGRAPHY  02/11/2000   EF 60-65%  . FOOT SURGERY    . NOSE SURGERY    . TOTAL KNEE ARTHROPLASTY     right knee   OB History    No data available     Home Medications    Prior to Admission medications   Medication Sig Start Date End Date Taking? Authorizing Provider  ALPRAZolam Duanne Moron) 0.5 MG tablet Take 0.5 mg by mouth at bedtime as needed. Anxiety, sleep    Historical Provider, MD  atorvastatin (LIPITOR) 20 MG tablet Take 1 tablet (20 mg total) by mouth daily. 03/04/15   Thayer Headings, MD  metoprolol (TOPROL-XL) 100 MG 24 hr tablet Take 100 mg by mouth at bedtime.     Historical Provider, MD  oxyCODONE-acetaminophen (PERCOCET) 10-325 MG per tablet Take 1 tablet by mouth every 4 (four) hours as needed.    Historical Provider, MD  propranolol (INDERAL) 10 MG tablet Take 10 mg by mouth daily as needed. For svt  episodes  03/27/11   Thayer Headings, MD  rizatriptan (MAXALT) 10 MG tablet Take 10 mg by mouth daily as needed. May repeat in 2 hours if needed. For migraines    Historical Provider, MD  spironolactone (ALDACTONE) 50 MG tablet Take 50 mg by mouth daily.     Historical Provider, MD   Family History Family History  Problem Relation Age of Onset  . Myocarditis Mother   . Aneurysm Father   . Breast cancer Paternal Aunt    Social History Social History  Substance Use Topics  . Smoking status: Never Smoker  . Smokeless tobacco: Never Used  . Alcohol use No   Allergies   Amoxicillin; Biaxin [clarithromycin]; Crestor  [rosuvastatin calcium]; Doxycycline; Penicillins; Pravachol; and Topamax [topiramate]  Review of Systems Review of Systems  Constitutional: Negative for chills and fever.  Respiratory: Positive for shortness of breath. Negative for cough.   Cardiovascular: Positive for chest pain. Negative for leg swelling.  Gastrointestinal: Positive for nausea. Negative for vomiting.  All other systems reviewed and are negative.  Physical Exam Updated Vital Signs BP 138/70 (BP Location: Left Arm)   Pulse 84   Temp 98.4 F (36.9 C)   Resp 13   SpO2 96%   Physical Exam  Constitutional: She appears well-developed and well-nourished. No distress.  HENT:  Head: Normocephalic and atraumatic.  Mouth/Throat: Oropharynx is clear and moist. No oropharyngeal exudate.  Eyes: Conjunctivae and EOM are normal. Pupils are equal, round, and reactive to light. Right eye exhibits no discharge. Left eye exhibits no discharge. No scleral icterus.  Neck: Normal range of motion. Neck supple. No JVD present. No thyromegaly present.  Cardiovascular: Normal rate, regular rhythm, normal heart sounds and intact distal pulses.  Exam reveals no gallop and no friction rub.   No murmur heard. Pulmonary/Chest: Effort normal and breath sounds normal. No respiratory distress. She has no wheezes. She has no rales.  Abdominal: Soft. Bowel sounds are normal. She exhibits no distension and no mass. There is no tenderness.  Musculoskeletal: Normal range of motion. She exhibits no edema or tenderness.  Lymphadenopathy:    She has no cervical adenopathy.  Neurological: She is alert. Coordination normal.  Skin: Skin is warm and dry. No rash noted. No erythema.  Psychiatric: She has a normal mood and affect. Her behavior is normal.  Nursing note and vitals reviewed.  ED Treatments / Results  DIAGNOSTIC STUDIES: Oxygen Saturation is 96% on RA, normal by my interpretation.   COORDINATION OF CARE: 7:25 PM-Discussed next steps with  pt. Pt verbalized understanding and is agreeable with the plan.   Labs (all labs ordered are listed, but only abnormal results are displayed) Labs Reviewed  BASIC METABOLIC PANEL - Abnormal; Notable for the following:       Result Value   Glucose, Bld 170 (*)    All other components within normal limits  CBC - Abnormal; Notable for the following:    Hemoglobin 15.9 (*)    HCT 48.2 (*)    All other components within normal limits  I-STAT TROPOININ, ED   EKG  EKG Interpretation  Date/Time:  Tuesday March 10 2016 14:13:59 EDT Ventricular Rate:  77 PR Interval:  146 QRS Duration: 90 QT Interval:  370 QTC Calculation: 418 R Axis:   1 Text Interpretation:  Normal sinus rhythm Normal ECG Confirmed by Christy Gentles  MD, DONALD ([id number]) on 03/11/2016 3:21:44 PM      Radiology Dg Chest 2 View  Result Date: 03/10/2016  CLINICAL DATA:  Chest pain, nausea, and shortness of breath for 1 day. Hypertension and diabetes. Substernal goiter. EXAM: CHEST  2 VIEW COMPARISON:  Chest CTA on 03/27/2011 and chest radiograph on 03/24/2011 FINDINGS: Heart size is normal. Left superior mediastinal mass is again seen causing right paratracheal deviation. This is stable and consistent with substernal goiter as shown on previous chest CTA. No evidence of pulmonary infiltrate or edema. No evidence of pleural effusion or pneumothorax. IMPRESSION: No active lung disease. Stable left superior mediastinal mass, consistent with substernal goiter as demonstrated on previous chest CTA. Electronically Signed   By: Earle Gell M.D.   On: 03/10/2016 15:29   Procedures Procedures   Medications Ordered in ED Medications - No data to display  Initial Impression / Assessment and Plan / ED Course  I have reviewed the triage vital signs and the nursing notes.  Pertinent labs & imaging results that were available during my care of the patient were reviewed by me and considered in my medical decision making (see chart for  details).  Clinical Course   65yf with CP. Doubt ACS, PE, dissection or other emergent process.   Final Clinical Impressions(s) / ED Diagnoses   Final diagnoses:  Chest pain, unspecified chest pain type  Nonspecific chest pain    New Prescriptions New Prescriptions   No medications on file    I personally preformed the services scribed in my presence. The recorded information has been reviewed is accurate. Virgel Manifold, MD.     Virgel Manifold, MD 03/22/16 [phone]

## 2016-03-10 NOTE — ED Triage Notes (Signed)
Patient here with chest pain that she describes as heaviness since early am. Denies radiation of pain, no shortness of breath. States the pain is worse when she takes a deep breath, NAD.

## 2016-03-16 ENCOUNTER — Other Ambulatory Visit: Payer: Self-pay | Admitting: Cardiovascular Disease

## 2016-03-25 ENCOUNTER — Encounter: Payer: Self-pay | Admitting: Cardiovascular Disease

## 2016-03-27 ENCOUNTER — Ambulatory Visit: Payer: BLUE CROSS/BLUE SHIELD | Admitting: Cardiovascular Disease

## 2016-04-30 ENCOUNTER — Other Ambulatory Visit: Payer: Self-pay | Admitting: Cardiovascular Disease

## 2016-07-02 ENCOUNTER — Telehealth: Payer: Self-pay | Admitting: Nurse Practitioner

## 2016-07-02 ENCOUNTER — Other Ambulatory Visit: Payer: Self-pay | Admitting: Nurse Practitioner

## 2016-07-02 ENCOUNTER — Other Ambulatory Visit: Payer: BLUE CROSS/BLUE SHIELD | Admitting: *Deleted

## 2016-07-02 DIAGNOSIS — E782 Mixed hyperlipidemia: Secondary | ICD-10-CM

## 2016-07-02 LAB — COMPREHENSIVE METABOLIC PANEL
ALT: 22 U/L (ref 6–29)
AST: 24 U/L (ref 10–35)
Albumin: 4.1 g/dL (ref 3.6–5.1)
Alkaline Phosphatase: 104 U/L (ref 33–130)
BUN: 15 mg/dL (ref 7–25)
CO2: 26 mmol/L (ref 20–31)
Calcium: 9.3 mg/dL (ref 8.6–10.4)
Chloride: 104 mmol/L (ref 98–110)
Creat: 0.89 mg/dL (ref 0.50–0.99)
Glucose, Bld: 112 mg/dL — ABNORMAL HIGH (ref 65–99)
Potassium: 4.9 mmol/L (ref 3.5–5.3)
Sodium: 138 mmol/L (ref 135–146)
Total Bilirubin: 0.5 mg/dL (ref 0.2–1.2)
Total Protein: 6.8 g/dL (ref 6.1–8.1)

## 2016-07-02 LAB — LIPID PANEL
Cholesterol: 218 mg/dL — ABNORMAL HIGH (ref <200)
HDL: 39 mg/dL — ABNORMAL LOW (ref >50)
LDL Cholesterol: 138 mg/dL — ABNORMAL HIGH (ref <100)
Total CHOL/HDL Ratio: 5.6 Ratio — ABNORMAL HIGH (ref <5.0)
Triglycerides: 205 mg/dL — ABNORMAL HIGH (ref <150)
VLDL: 41 mg/dL — ABNORMAL HIGH (ref <30)

## 2016-07-02 NOTE — Telephone Encounter (Signed)
Received call from Mack Guise in [facility]; patient has walked in requesting follow up appointment and lab work with Dr. Acie Fredrickson.  Ivin Booty scheduled patient for ov with Dr. Acie Fredrickson for January.  Patient states she is out of her atorvastatin and needs a refill.  She is due for lab work.  She states she is currently fasting so we have scheduled her for lab work today and will send Rx based on results.  Patient thanked [place] for our help.

## 2016-07-03 ENCOUNTER — Telehealth: Payer: Self-pay | Admitting: Cardiovascular Disease

## 2016-07-03 DIAGNOSIS — E782 Mixed hyperlipidemia: Secondary | ICD-10-CM

## 2016-07-03 MED ORDER — ATORVASTATIN CALCIUM 20 MG PO TABS: 20.0000 mg | ORAL_TABLET | Freq: Every day | ORAL | 3 refills | Status: DC

## 2016-07-03 NOTE — Telephone Encounter (Signed)
Results and plan of care reviewed with patient. She states she has been out of her lipitor for approximately 2 weeks.  She states she is discouraged by these results because she has been working hard on her diet. Reports she was recently diagnosed with type 2 diabetes but has already gotten her A1C down to 6.1 and is losing weight. She states she has to eat a bland diet due to IBS and it is difficult not to eat a lot of carbohydrates. We discussed the effect of carbs on triglyceride and HDL levels. She reports no problems on lipitor.  I advised her to restart at 20 mg and to return for repeat fasting lab work in 3 months.  She has an office visit with Dr. Acie Fredrickson on 1/22. She verbalized understanding and agreement with plan of care and thanked me for the call.

## 2016-07-03 NOTE — Telephone Encounter (Signed)
Follow Up:; ° ° °Returning your call. °

## 2016-07-03 NOTE — Telephone Encounter (Signed)
-----   Message from Thayer Headings, MD sent at 07/02/2016  5:44 PM EST ----- Her LDL is a bit higher. She may have run out of her atorvastatin Restart atorvastatin. Have her work on diet and more exercise recheck in 3 months

## 2016-07-08 ENCOUNTER — Other Ambulatory Visit: Payer: Self-pay | Admitting: Radiology

## 2016-07-28 ENCOUNTER — Other Ambulatory Visit: Payer: BLUE CROSS/BLUE SHIELD

## 2016-08-03 ENCOUNTER — Ambulatory Visit (INDEPENDENT_AMBULATORY_CARE_PROVIDER_SITE_OTHER): Payer: BLUE CROSS/BLUE SHIELD | Admitting: Cardiovascular Disease

## 2016-08-03 ENCOUNTER — Encounter: Payer: Self-pay | Admitting: Cardiovascular Disease

## 2016-08-03 VITALS — BP 112/60 | HR 60 | Ht 66.5 in | Wt 231.8 lb

## 2016-08-03 DIAGNOSIS — I471 Supraventricular tachycardia: Secondary | ICD-10-CM | POA: Diagnosis not present

## 2016-08-03 DIAGNOSIS — I10 Essential (primary) hypertension: Secondary | ICD-10-CM

## 2016-08-03 DIAGNOSIS — Z79899 Other long term (current) drug therapy: Secondary | ICD-10-CM | POA: Diagnosis not present

## 2016-08-03 MED ORDER — ATORVASTATIN CALCIUM 20 MG PO TABS: 20.0000 mg | ORAL_TABLET | Freq: Every day | ORAL | 3 refills | Status: DC

## 2016-08-03 NOTE — Progress Notes (Signed)
[name] Date of Birth  1951/03/08       [facility] [address], [place], [place]  [address]   [place], [place]  [address] [phone]     [phone]   Fax  [phone]    Fax [phone]  Problem List: 1. Hypertension 2. Supraventricular tachycardia 3. Hyperlipidemia   History of Present Illness:  [name] is a 66 yo with hx as noted above.  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    she had one episode of supraventricular tachycardia last year that the propranolol with resolution of the episode.   Nov 22, 2013:  [name] is doing well.  No CP ,  No dyspnea.  BP has been well controlled.    March 04, 2015:  Trying to stay active.  Had gained some weight and has lost most of it No CP , no dyspnea   Jan. 22, 2018:  [name] is seen back today after a 1-1/2 year absence. Had some chest pressure in Aug. 2017.   Went to the ER .  Was diagnosed with GERD .   Has been more careful with her diet - eating smaller meals.   Current Outpatient Prescriptions on File Prior to Visit  Medication Sig Dispense Refill  . ALPRAZolam (XANAX) 0.5 MG tablet Take 0.5 mg by mouth at bedtime as needed. Anxiety, sleep    . atorvastatin (LIPITOR) 20 MG tablet Take 1 tablet (20 mg total) by mouth daily. 90 tablet 3  . metoprolol (TOPROL-XL) 100 MG 24 hr tablet Take 100 mg by mouth at bedtime.     . propranolol (INDERAL) 10 MG tablet Take 10 mg by mouth daily as needed. For svt episodes     . rizatriptan (MAXALT) 10 MG tablet Take 10 mg by mouth daily as needed. May repeat in 2 hours if needed. For migraines    . spironolactone (ALDACTONE) 50 MG tablet Take 50 mg by mouth daily.     Marland Kitchen zolpidem (AMBIEN) 5 MG tablet Take 5 mg by mouth at bedtime.     No current facility-administered medications on file prior to visit.     Allergies  Allergen Reactions  . Amoxicillin     REACTION: Hives  .  Biaxin [Clarithromycin]     REACTION: Hives  . Crestor [Rosuvastatin Calcium]     ARM PAIN  . Doxycycline     REACTION: Hives  . Penicillins     REACTION: Hives  . Pravachol     ARM PAIN  . Topamax [Topiramate] Anxiety    Past Medical History:  Diagnosis Date  . Arthritis   . Chest pain   . Chest tightness   . Depression   . Hyperlipidemia   . Hypertension   . Migraine headache   . SOB (shortness of breath)   . SVT (supraventricular tachycardia) ([place])   . Thyroid disease    goiter  . Ulcerative colitis [facility])     Past Surgical History:  Procedure Laterality Date  . APPENDECTOMY    . CARDIOVASCULAR STRESS TEST  11/05/2003   EF 68%  . CHOLECYSTECTOMY    . DOPPLER ECHOCARDIOGRAPHY  02/11/2000   EF 60-65%  . FOOT SURGERY    . NOSE SURGERY    . TOTAL KNEE ARTHROPLASTY     right knee    History  Smoking Status  . Never Smoker  Smokeless Tobacco  . Never Used    History  Alcohol Use No    Family History  Problem Relation Age of Onset  . Myocarditis Mother   . Aneurysm Father   . Breast cancer Paternal Aunt     Reviw of Systems:  Reviewed in the HPI.  All other systems are negative.  Physical Exam: Blood pressure 112/60, pulse 60, height 5' 6.5" (1.689 m), weight 231 lb 12.8 oz (105.1 kg), SpO2 93 %. General: Well developed, well nourished, in no acute distress.  Head: Normocephalic, atraumatic, sclera non-icteric, mucus membranes are moist,   Neck: Supple. Carotids are 2 + without bruits. No JVD   Lungs: Clear   Heart: RR, normal S1, S2  Abdomen: Soft, non-tender, non-distended with normal bowel sounds.  Msk:  Strength and tone are normal   Extremities: No clubbing or cyanosis. No edema.  Distal pedal pulses are 2+ and equal    Neuro: CN II - XII intact.  Alert and oriented X 3.   Psych:  Normal   ECG:  Assessment / Plan:   1. Hypertension - blood pressures fairly well-controlled. Continue current medications.  2. Supraventricular  tachycardia - she's not had any recurrent episodes of SVT. Can continue current dose of metoprolol.   3. Hyperlipidemia -  Has restarted atorvastatin . Labs look ok .will recheck in 1 years.      Mertie Moores, MD  08/03/2016 8:23 AM    [place],  [place], [place]  [address] Pager [phone] Phone: [phone]; Fax: [phone]

## 2016-08-03 NOTE — Patient Instructions (Addendum)
Medication Instructions:    Your physician recommends that you continue on your current medications as directed. Please refer to the Current Medication list given to you today.  --- If you need a refill on your cardiac medications before your next appointment, please call your pharmacy. ---  Labwork:  Your physician recommends that you return for lab work in: 1 year for fasting lab work  (Lipid profile & CMET)  Testing/Procedures:  None ordered  Follow-Up:  Your physician wants you to follow-up in: 1 year with Dr. Acie Fredrickson.  You will receive a reminder letter in the mail two months in advance. If you don't receive a letter, please call our office to schedule the follow-up appointment.  Thank you for choosing [facility]!!

## 2016-10-06 ENCOUNTER — Other Ambulatory Visit: Payer: BLUE CROSS/BLUE SHIELD

## 2016-12-29 ENCOUNTER — Other Ambulatory Visit: Payer: Self-pay | Admitting: Internal Medicine

## 2016-12-29 DIAGNOSIS — E042 Nontoxic multinodular goiter: Secondary | ICD-10-CM

## 2017-01-07 ENCOUNTER — Ambulatory Visit
Admission: RE | Admit: 2017-01-07 | Discharge: 2017-01-07 | Disposition: A | Discharge status: Home / Self Care | Payer: BLUE CROSS/BLUE SHIELD | Source: Ambulatory Visit | Attending: Internal Medicine | Admitting: Internal Medicine

## 2017-01-07 DIAGNOSIS — E042 Nontoxic multinodular goiter: Secondary | ICD-10-CM

## 2017-09-29 ENCOUNTER — Other Ambulatory Visit: Payer: Self-pay | Admitting: Cardiovascular Disease

## 2017-11-05 ENCOUNTER — Other Ambulatory Visit: Payer: Self-pay | Admitting: Cardiovascular Disease

## 2017-11-17 DIAGNOSIS — M2550 Pain in unspecified joint: Secondary | ICD-10-CM | POA: Diagnosis not present

## 2017-11-17 DIAGNOSIS — F5101 Primary insomnia: Secondary | ICD-10-CM | POA: Diagnosis not present

## 2017-11-17 DIAGNOSIS — E785 Hyperlipidemia, unspecified: Secondary | ICD-10-CM | POA: Diagnosis not present

## 2017-11-17 DIAGNOSIS — G43109 Migraine with aura, not intractable, without status migrainosus: Secondary | ICD-10-CM | POA: Diagnosis not present

## 2017-11-17 DIAGNOSIS — K589 Irritable bowel syndrome without diarrhea: Secondary | ICD-10-CM | POA: Diagnosis not present

## 2017-11-17 DIAGNOSIS — E063 Autoimmune thyroiditis: Secondary | ICD-10-CM | POA: Diagnosis not present

## 2017-11-17 DIAGNOSIS — F419 Anxiety disorder, unspecified: Secondary | ICD-10-CM | POA: Diagnosis not present

## 2017-11-17 DIAGNOSIS — I10 Essential (primary) hypertension: Secondary | ICD-10-CM | POA: Diagnosis not present

## 2017-11-17 DIAGNOSIS — S43499A Other sprain of unspecified shoulder joint, initial encounter: Secondary | ICD-10-CM | POA: Diagnosis not present

## 2017-11-17 DIAGNOSIS — E119 Type 2 diabetes mellitus without complications: Secondary | ICD-10-CM | POA: Diagnosis not present

## 2017-11-17 DIAGNOSIS — N3010 Interstitial cystitis (chronic) without hematuria: Secondary | ICD-10-CM | POA: Diagnosis not present

## 2017-11-17 DIAGNOSIS — S46819A Strain of other muscles, fascia and tendons at shoulder and upper arm level, unspecified arm, initial encounter: Secondary | ICD-10-CM | POA: Diagnosis not present

## 2017-12-22 DIAGNOSIS — M542 Cervicalgia: Secondary | ICD-10-CM | POA: Diagnosis not present

## 2017-12-22 DIAGNOSIS — R251 Tremor, unspecified: Secondary | ICD-10-CM | POA: Diagnosis not present

## 2017-12-22 DIAGNOSIS — R0683 Snoring: Secondary | ICD-10-CM | POA: Diagnosis not present

## 2017-12-22 DIAGNOSIS — F419 Anxiety disorder, unspecified: Secondary | ICD-10-CM | POA: Diagnosis not present

## 2017-12-23 ENCOUNTER — Ambulatory Visit
Admission: RE | Admit: 2017-12-23 | Discharge: 2017-12-23 | Disposition: A | Discharge status: Home / Self Care | Payer: Medicare Other | Source: Ambulatory Visit | Attending: Family Medicine | Admitting: Family Medicine

## 2017-12-23 ENCOUNTER — Other Ambulatory Visit: Payer: Self-pay | Admitting: Family Medicine

## 2017-12-23 DIAGNOSIS — M542 Cervicalgia: Secondary | ICD-10-CM | POA: Diagnosis not present

## 2018-01-05 ENCOUNTER — Ambulatory Visit: Payer: Medicare Other | Admitting: Neurology

## 2018-01-17 DIAGNOSIS — N6011 Diffuse cystic mastopathy of right breast: Secondary | ICD-10-CM | POA: Diagnosis not present

## 2018-01-17 DIAGNOSIS — R922 Inconclusive mammogram: Secondary | ICD-10-CM | POA: Diagnosis not present

## 2018-01-18 DIAGNOSIS — K529 Noninfective gastroenteritis and colitis, unspecified: Secondary | ICD-10-CM | POA: Diagnosis not present

## 2018-01-18 DIAGNOSIS — Z808 Family history of malignant neoplasm of other organs or systems: Secondary | ICD-10-CM | POA: Diagnosis not present

## 2018-01-18 DIAGNOSIS — E042 Nontoxic multinodular goiter: Secondary | ICD-10-CM | POA: Diagnosis not present

## 2018-01-18 DIAGNOSIS — Z6836 Body mass index (BMI) 36.0-36.9, adult: Secondary | ICD-10-CM | POA: Diagnosis not present

## 2018-01-18 DIAGNOSIS — E669 Obesity, unspecified: Secondary | ICD-10-CM | POA: Diagnosis not present

## 2018-01-18 DIAGNOSIS — E119 Type 2 diabetes mellitus without complications: Secondary | ICD-10-CM | POA: Diagnosis not present

## 2018-01-18 DIAGNOSIS — R1310 Dysphagia, unspecified: Secondary | ICD-10-CM | POA: Diagnosis not present

## 2018-01-18 DIAGNOSIS — E063 Autoimmune thyroiditis: Secondary | ICD-10-CM | POA: Diagnosis not present

## 2018-01-24 DIAGNOSIS — G4733 Obstructive sleep apnea (adult) (pediatric): Secondary | ICD-10-CM | POA: Diagnosis not present

## 2018-02-16 ENCOUNTER — Telehealth: Payer: Self-pay | Admitting: Neurology

## 2018-02-16 ENCOUNTER — Ambulatory Visit (INDEPENDENT_AMBULATORY_CARE_PROVIDER_SITE_OTHER): Payer: Medicare Other | Admitting: Neurology

## 2018-02-16 ENCOUNTER — Encounter: Payer: Self-pay | Admitting: Neurology

## 2018-02-16 VITALS — BP 118/69 | HR 56 | Ht 66.0 in | Wt 226.0 lb

## 2018-02-16 DIAGNOSIS — Z808 Family history of malignant neoplasm of other organs or systems: Secondary | ICD-10-CM | POA: Diagnosis not present

## 2018-02-16 DIAGNOSIS — R251 Tremor, unspecified: Secondary | ICD-10-CM

## 2018-02-16 NOTE — Progress Notes (Signed)
Subjective:    Patient ID: [name] is a 67 y.o. female.  HPI     Star Age, MD, PhD [facility] [address], [address] [place], [place] [address]  Dear Dr. Kenton Kingfisher,   I saw your patient, [name], upon your kind request in my neurologic clinic today for initial consultation of her head tremor. The patient is unaccompanied today. As you know, [name] is a 67 year old right-handed woman with an underlying medical history of SVT, hyperlipidemia, interstitial cystitis, anxiety, history of pneumonia, hypertension, irritable bowel syndrome, goiter, depression, thyroid disease, ulcerative colitis, diabetes, status post multiple surgeries including shoulder surgery, and interstim placement, cholecystectomy, hysterectomy, septoplasty, right knee replacement and obesity, who reports a head tremor for the past 1+ years. I reviewed your office note from 12/22/2017, which you kindly included. She was referred to Dr. Maxwell Caul for sleep evaluation as well. She reports that her sleep study showed mild sleep apnea. She is working on weight loss for her sleep apnea management. Of note, she has been on Lexapro for the past 1-1/2 years. She has been on metoprolol 100 mg long acting once daily for her SVT and also as needed propranolol 10 mg for flareup of her SVT. She has seen cardiology for this. She is a nonsmoker and drinks alcohol rarely, maybe once a year, caffeine and limitation, 1 cup per day on average. She is not bothered by the tremor. She is actually typically not aware of it. She has noted some neck pain. Had X ray of the neck on 12/23/17 and I reviewed the results: IMPRESSION: Mild degenerative facet disease.  No acute bony abnormality. She has a history of lower back degenerative disease and has seen orthopedics for this. She was given a prescription for tramadol as needed which she has taken and it has helped her neck pain. She does not have a  family history of tremors. She reports a family history of brain cancer, unclear which type. She states that her maternal aunt died from brain cancer, mom died at 75 from myocarditis, father also had brain cancer. Her tremor is primarily noticed by her husband and a friend who lives with them.  She had recent blood work through your office which I reviewed. CMP showed a sugar of 148 in May 2019, CBC with differential was unremarkable, BUN was 9, creatinine 0.77, A1c was slightly elevated at 7.1. She has a history of recurrent UTIs.  Her Past Medical History Is Significant For: Past Medical History:  Diagnosis Date  . Arthritis   . Chest pain   . Chest tightness   . Depression   . Hyperlipidemia   . Hypertension   . Migraine headache   . SOB (shortness of breath)   . SVT (supraventricular tachycardia) ([place])   . Thyroid disease    goiter  . Ulcerative colitis ([place])     Her Past Surgical History Is Significant For: Past Surgical History:  Procedure Laterality Date  . APPENDECTOMY    . CARDIOVASCULAR STRESS TEST  11/05/2003   EF 68%  . CHOLECYSTECTOMY    . DOPPLER ECHOCARDIOGRAPHY  02/11/2000   EF 60-65%  . FOOT SURGERY    . NOSE SURGERY    . TOTAL KNEE ARTHROPLASTY     right knee    Her Family History Is Significant For: Family History  Problem Relation Age of Onset  . Myocarditis Mother   . Aneurysm Father   . Breast cancer Paternal Aunt  Her Social History Is Significant For: Social History   Socioeconomic History  . Marital status: Married    Spouse name: Not on file  . Number of children: 3  . Years of education: Not on file  . Highest education level: Not on file  Occupational History  . Occupation: Aeronautical engineer: [employer]  Social Needs  . Financial resource strain: Not on file  . Food insecurity:    Worry: Not on file    Inability: Not on file  . Transportation needs:    Medical: Not on file    Non-medical: Not on file  Tobacco  Use  . Smoking status: Never Smoker  . Smokeless tobacco: Never Used  Substance and Sexual Activity  . Alcohol use: No  . Drug use: No  . Sexual activity: Not on file  Lifestyle  . Physical activity:    Days per week: Not on file    Minutes per session: Not on file  . Stress: Not on file  Relationships  . Social connections:    Talks on phone: Not on file    Gets together: Not on file    Attends religious service: Not on file    Active member of club or organization: Not on file    Attends meetings of clubs or organizations: Not on file    Relationship status: Not on file  Other Topics Concern  . Not on file  Social History Narrative  . Not on file    Her Allergies Are:  Allergies  Allergen Reactions  . Amoxicillin     REACTION: Hives  . Biaxin [Clarithromycin]     REACTION: Hives  . Crestor [Rosuvastatin Calcium]     ARM PAIN  . Doxycycline     REACTION: Hives  . Penicillins     REACTION: Hives  . Pravachol     ARM PAIN  . Topamax [Topiramate] Anxiety  :   Her Current Medications Are:  Outpatient Encounter Medications as of 02/16/2018  Medication Sig  . ALPRAZolam (XANAX) 0.5 MG tablet Take 0.5 mg by mouth at bedtime as needed. Anxiety, sleep  . atorvastatin (LIPITOR) 20 MG tablet Take 1 tablet (20 mg total) by mouth daily. Patient needs to call and schedule an appointment for further refills 3rd/final attempt  . dicyclomine (BENTYL) 10 MG capsule TAKE ONE CAPSULE BY MOUTH 4 TIMES A DAY  . escitalopram (LEXAPRO) 10 MG tablet Take 10 mg by mouth daily.  . metoprolol (TOPROL-XL) 100 MG 24 hr tablet Take 100 mg by mouth at bedtime.   . Probiotic Product (PRO-BIOTIC BLEND PO) Take by mouth.  . propranolol (INDERAL) 10 MG tablet Take 10 mg by mouth daily as needed. For svt episodes   . ranitidine (ZANTAC) 300 MG tablet TAKE 1 TABLET BY MOUTH TWICE A DAY FOR GERD  . rizatriptan (MAXALT) 10 MG tablet Take 10 mg by mouth daily as needed. May repeat in 2 hours if needed.  For migraines  . traMADol (ULTRAM) 50 MG tablet Take 50 mg by mouth 2 (two) times daily as needed.  . zolpidem (AMBIEN) 5 MG tablet Take 5 mg by mouth at bedtime.  . [DISCONTINUED] spironolactone (ALDACTONE) 50 MG tablet Take 50 mg by mouth daily.    No facility-administered encounter medications on file as of 02/16/2018.   :   Review of Systems:  Out of a complete 14 point review of systems, all are reviewed and negative with the exception of these  symptoms as listed below: Review of Systems  Neurological:       Pt presents today because when she is sitting or focused on something her head is in constant movement. She brought a video of it happening. She states that she doesn't feel when its happening. She denies dizziness. She has been told by her husband that this started about a year ago. This has worsened. She had images completed to evaluate her neck which shows arthritis. Pt completed sleep study done with another MD and this showed mild apnea. She states that the only thing that seems to alleviate the head movement is laying down or leaning back and resting her head.     Objective:  Neurological Exam  Physical Exam Physical Examination:   Vitals:   02/16/18 1424  BP: 118/69  Pulse: (!) 56    General Examination: The patient is a very pleasant 67 y.o. female in no acute distress. She appears well-developed and well-nourished and well groomed.   HEENT: Normocephalic, atraumatic, pupils are equal, round and reactive to light and accommodation. Extraocular tracking is good without limitation to gaze excursion or nystagmus noted. Normal smooth pursuit is noted. Hearing is grossly intact. Face is symmetric with normal facial animation and normal facial sensation. Speech is clear with no dysarthria noted. There is no hypophonia. There is no lip, neck/head, jaw or voice tremor. Neck shows good ROM. There are no carotid bruits on auscultation.   Chest: Clear to auscultation without  wheezing, rhonchi or crackles noted.  Heart: S1+S2+0, regular and normal without murmurs, rubs or gallops noted.   Abdomen: Soft, non-tender and non-distended with normal bowel sounds appreciated on auscultation.  Extremities: There is no pitting edema in the distal lower extremities bilaterally. Pedal pulses are intact.  Skin: Warm and dry without trophic changes noted. There are no varicose veins.  Musculoskeletal: exam reveals no obvious joint deformities, tenderness or joint swelling or erythema.   Neurologically:  Mental status: The patient is awake, alert and oriented in all 4 spheres. Her immediate and remote memory, attention, language skills and fund of knowledge are appropriate. There is no evidence of aphasia, agnosia, apraxia or anomia. Speech is clear with normal prosody and enunciation. Thought process is linear. Mood is normal and affect is normal.  Cranial nerves II - XII are as described above under HEENT exam. In addition: shoulder shrug is normal with equal shoulder height noted. Motor exam: Normal bulk, strength and tone is noted. There is no drift, tremor or rebound. Romberg is negative. Reflexes are 1+ throughout. Fine motor skills and coordination: intact with normal finger taps, normal hand movements, normal rapid alternating patting, normal foot taps and normal foot agility.  Cerebellar testing: No dysmetria or intention tremor on finger to nose testing. Heel to shin is unremarkable bilaterally. There is no truncal or gait ataxia.  Sensory exam: intact to light touch in the upper and lower extremities.  Gait, station and balance: She stands easily. No veering to one side is noted. No leaning to one side is noted. Posture is age-appropriate and stance is narrow based. Gait shows normal stride length and normal pace. No problems turning are noted. Tandem walk is unremarkable.   Assessment and Plan:   In summary, [name] is a very pleasant 67 y.o.-year old female  with an underlying medical history of SVT, hyperlipidemia, interstitial cystitis, anxiety, history of pneumonia, hypertension, irritable bowel syndrome, goiter, depression, thyroid disease, ulcerative colitis, diabetes, status post multiple surgeries including shoulder  surgery, and interstim placement, cholecystectomy, hysterectomy, septoplasty, right knee replacement and obesity, who presents for evaluation of her head tremor. She reports a 1+ year history of intermittent had trembling, not typically noticed by her but noticed by her husband and friends. She's not particularly bothered by it. She has a history of degenerative lower back disease for which she has seen orthopedics and recent x-ray showed some degenerative findings in her neck. She does complain of neck pain for which she is encouraged to talk to about seeing orthopedics again. She has found some relief with as needed tramadol. From the neurological standpoint, her exam is nonfocal, no apparent tremor, no hand tremor. No telltale family history of tremor. She does give a family history of brain cancer of unknown type. For that reason I would recommend pursuing a brain MRI with and without contrast. She has normal kidney function. She would be willing to pursue a brain MRI. We will call her with her test results. For treatment of her tremor would not recommend any new medication. She is advised that there is a rare possibility that she has started having the tremor after starting Lexapro. Some SSRI type antidepressants can cause tremor. Typically the risk is in the low single digits percentage. Furthermore, typical medication that we utilize for symptomatic tremor control includes a beta blocker and she is already on long-acting metoprolol and as needed propranolol. For that reason I would not recommend any other medications at this time. So long as her brain MRI is within age-appropriate findings I can see her back on an as-needed basis.  Thank you  very much for allowing me to participate in the care of this nice patient. If I can be of any further assistance to you please do not hesitate to call me at [phone].  Sincerely,   Star Age, MD, PhD

## 2018-02-16 NOTE — Telephone Encounter (Signed)
Medicare/[place] of [place] order sentt to GI. They will reach out to the pt to schedule.

## 2018-02-16 NOTE — Patient Instructions (Signed)
You have a benign neurological exam. I would not recommend any new medication for your intermittent head tremor. For neck discomfort I would recommend he consider seeing her orthopedic doctor for degenerative spine disease. You are already on a beta blocker which we typically utilize for tremor control, would not recommend increasing the dose. Sometimes patients can have tremors from a medication including an antidepressant such as Lexapro. You can talk to your doctor about potentially coming off of this medication if he would like to try. However, from my end of things, since her exam is benign I would not necessarily push for you to come off of it. Generally speaking, side effect of tremor on an antidepressant is generally in the low single-digit percentage. Given your family history of brain tumor, I would recommend that we pursue a brain MRI and I will order one. We will call you to schedule it. We will call you with the results. As long as the MRI results are age-appropriate, I can see her back on an as-needed basis.

## 2018-02-27 ENCOUNTER — Ambulatory Visit
Admission: RE | Admit: 2018-02-27 | Discharge: 2018-02-27 | Disposition: A | Discharge status: Home / Self Care | Payer: Medicare Other | Source: Ambulatory Visit | Attending: Neurology | Admitting: Neurology

## 2018-02-27 DIAGNOSIS — R251 Tremor, unspecified: Secondary | ICD-10-CM

## 2018-02-27 DIAGNOSIS — Z808 Family history of malignant neoplasm of other organs or systems: Secondary | ICD-10-CM

## 2018-02-27 MED ORDER — GADOBENATE DIMEGLUMINE 529 MG/ML IV SOLN
20.0000 mL | Freq: Once | INTRAVENOUS | Status: AC | PRN
  Administered 2018-02-27: 20 mL via INTRAVENOUS

## 2018-03-01 ENCOUNTER — Telehealth: Payer: Self-pay

## 2018-03-01 NOTE — Telephone Encounter (Signed)
I called pt and explained her MRI results. Pt reports that her spine doctor "won't see me until these tremors go away." Pt reports taking her propranolol daily. Pt will discuss these results and her next steps regarding her tremor, back and neck pain with her PCP. Pt verbalized understanding of results. Pt had no questions at this time but was encouraged to call back if questions arise.

## 2018-03-01 NOTE — Progress Notes (Signed)
There were non-specific changes on her brain MRI with size and structure normal for age. Thickening of the lining of the brain called meninges, was seen, but this is not a specific finding, can be seen in patients with a prior meningitis and with lower fluid pressure of the spinal fluid, none of which we suspect in her case. No explanation of tremor and no abnormal lesions seen in the brain, thankfully.  She can FU as needed.  Star Age, MD, PhD [facility])

## 2018-03-01 NOTE — Telephone Encounter (Signed)
-----   Message from Star Age, MD sent at 03/01/2018  7:38 AM EDT ----- There were non-specific changes on her brain MRI with size and structure normal for age. Thickening of the lining of the brain called meninges, was seen, but this is not a specific finding, can be seen in patients with a prior meningitis and with lower fluid pressure of the spinal fluid, none of which we suspect in her case. No explanation of tremor and no abnormal lesions seen in the brain, thankfully.  She can FU as needed.  Star Age, MD, PhD [facility])

## 2018-03-08 DIAGNOSIS — H5213 Myopia, bilateral: Secondary | ICD-10-CM | POA: Diagnosis not present

## 2018-03-08 DIAGNOSIS — R7303 Prediabetes: Secondary | ICD-10-CM | POA: Diagnosis not present

## 2018-03-08 DIAGNOSIS — H43813 Vitreous degeneration, bilateral: Secondary | ICD-10-CM | POA: Diagnosis not present

## 2018-03-08 DIAGNOSIS — H2513 Age-related nuclear cataract, bilateral: Secondary | ICD-10-CM | POA: Diagnosis not present

## 2018-03-24 DIAGNOSIS — R251 Tremor, unspecified: Secondary | ICD-10-CM | POA: Diagnosis not present

## 2018-03-25 DIAGNOSIS — K580 Irritable bowel syndrome with diarrhea: Secondary | ICD-10-CM | POA: Diagnosis not present

## 2018-03-25 DIAGNOSIS — R197 Diarrhea, unspecified: Secondary | ICD-10-CM | POA: Diagnosis not present

## 2018-03-25 DIAGNOSIS — K219 Gastro-esophageal reflux disease without esophagitis: Secondary | ICD-10-CM | POA: Diagnosis not present

## 2018-03-25 DIAGNOSIS — R1084 Generalized abdominal pain: Secondary | ICD-10-CM | POA: Diagnosis not present

## 2018-03-25 DIAGNOSIS — K625 Hemorrhage of anus and rectum: Secondary | ICD-10-CM | POA: Diagnosis not present

## 2018-04-02 DIAGNOSIS — Z23 Encounter for immunization: Secondary | ICD-10-CM | POA: Diagnosis not present

## 2018-04-05 ENCOUNTER — Telehealth: Payer: Self-pay | Admitting: Neurology

## 2018-04-05 NOTE — Telephone Encounter (Signed)
Pt is asking for a call back to discuss the time she has been off of escitalopram (LEXAPRO) 10 MG tablet.  Pt states she has had a very difficult time being off this medication.  Pt states her husband has informed her that her tremors are increasing.  Please call, pt sates she is in need of some type of medication

## 2018-04-05 NOTE — Telephone Encounter (Signed)
I would recommend she talk with PCP about Lexapro restart or trying something else for mood. Maybe a second opinion for tremor eval, but she had a rather benign exam for me and reported not noticing the tremor herself and not feeling bothered by it. No further recs at this time.

## 2018-04-05 NOTE — Telephone Encounter (Signed)
I called pt. She reports that she tapered off of her lexapro. However, the tremor is not improved and she feels worse off the lexapro. She wants to know what else Dr. Rexene Alberts recommends. I advised her that Dr. Rexene Alberts didn't have anything else to offer her, other than an MRI. I recommended that pt follow up with her PCP and her spine doctor. I will let Dr. Rexene Alberts know of this phone call and if she has any further recommendations for her head tremor, I will call her back. Pt verbalized understanding.

## 2018-04-05 NOTE — Telephone Encounter (Signed)
I called pt and discussed a option for second opinion for the tremor. Pt reports that she has already called her PCP to discuss restarting lexapro or something else for her mood. Pt verbalized understanding and appreciation.

## 2018-04-06 DIAGNOSIS — K2930 Chronic superficial gastritis without bleeding: Secondary | ICD-10-CM | POA: Diagnosis not present

## 2018-04-06 DIAGNOSIS — R197 Diarrhea, unspecified: Secondary | ICD-10-CM | POA: Diagnosis not present

## 2018-04-06 DIAGNOSIS — K625 Hemorrhage of anus and rectum: Secondary | ICD-10-CM | POA: Diagnosis not present

## 2018-04-06 DIAGNOSIS — K3189 Other diseases of stomach and duodenum: Secondary | ICD-10-CM | POA: Diagnosis not present

## 2018-04-06 DIAGNOSIS — D122 Benign neoplasm of ascending colon: Secondary | ICD-10-CM | POA: Diagnosis not present

## 2018-04-11 DIAGNOSIS — D122 Benign neoplasm of ascending colon: Secondary | ICD-10-CM | POA: Diagnosis not present

## 2018-04-11 DIAGNOSIS — K2930 Chronic superficial gastritis without bleeding: Secondary | ICD-10-CM | POA: Diagnosis not present

## 2018-05-04 DIAGNOSIS — K580 Irritable bowel syndrome with diarrhea: Secondary | ICD-10-CM | POA: Diagnosis not present

## 2018-05-04 DIAGNOSIS — K219 Gastro-esophageal reflux disease without esophagitis: Secondary | ICD-10-CM | POA: Diagnosis not present

## 2018-05-04 DIAGNOSIS — Z86010 Personal history of colonic polyps: Secondary | ICD-10-CM | POA: Diagnosis not present

## 2018-07-08 DIAGNOSIS — E063 Autoimmune thyroiditis: Secondary | ICD-10-CM | POA: Diagnosis not present

## 2018-07-08 DIAGNOSIS — E042 Nontoxic multinodular goiter: Secondary | ICD-10-CM | POA: Diagnosis not present

## 2018-07-08 DIAGNOSIS — E669 Obesity, unspecified: Secondary | ICD-10-CM | POA: Diagnosis not present

## 2018-07-08 DIAGNOSIS — E119 Type 2 diabetes mellitus without complications: Secondary | ICD-10-CM | POA: Diagnosis not present

## 2018-07-08 DIAGNOSIS — K529 Noninfective gastroenteritis and colitis, unspecified: Secondary | ICD-10-CM | POA: Diagnosis not present

## 2018-07-08 DIAGNOSIS — Z808 Family history of malignant neoplasm of other organs or systems: Secondary | ICD-10-CM | POA: Diagnosis not present

## 2018-07-11 DIAGNOSIS — Z803 Family history of malignant neoplasm of breast: Secondary | ICD-10-CM | POA: Diagnosis not present

## 2018-07-11 DIAGNOSIS — Z1231 Encounter for screening mammogram for malignant neoplasm of breast: Secondary | ICD-10-CM | POA: Diagnosis not present

## 2018-07-12 ENCOUNTER — Other Ambulatory Visit: Payer: Self-pay | Admitting: Internal Medicine

## 2018-07-12 DIAGNOSIS — E063 Autoimmune thyroiditis: Secondary | ICD-10-CM

## 2018-07-12 DIAGNOSIS — E042 Nontoxic multinodular goiter: Secondary | ICD-10-CM

## 2018-07-14 DIAGNOSIS — M542 Cervicalgia: Secondary | ICD-10-CM | POA: Diagnosis not present

## 2018-07-15 ENCOUNTER — Ambulatory Visit
Admission: RE | Admit: 2018-07-15 | Discharge: 2018-07-15 | Disposition: A | Discharge status: Home / Self Care | Payer: Medicare Other | Source: Ambulatory Visit | Attending: Internal Medicine | Admitting: Internal Medicine

## 2018-07-15 DIAGNOSIS — M542 Cervicalgia: Secondary | ICD-10-CM | POA: Diagnosis not present

## 2018-07-15 DIAGNOSIS — E063 Autoimmune thyroiditis: Secondary | ICD-10-CM

## 2018-07-15 DIAGNOSIS — E010 Iodine-deficiency related diffuse (endemic) goiter: Secondary | ICD-10-CM | POA: Diagnosis not present

## 2018-07-15 DIAGNOSIS — E042 Nontoxic multinodular goiter: Secondary | ICD-10-CM

## 2018-07-18 DIAGNOSIS — M542 Cervicalgia: Secondary | ICD-10-CM | POA: Diagnosis not present

## 2018-07-21 DIAGNOSIS — R600 Localized edema: Secondary | ICD-10-CM | POA: Diagnosis not present

## 2018-07-21 DIAGNOSIS — K219 Gastro-esophageal reflux disease without esophagitis: Secondary | ICD-10-CM | POA: Diagnosis not present

## 2018-07-21 DIAGNOSIS — M545 Low back pain: Secondary | ICD-10-CM | POA: Diagnosis not present

## 2018-07-21 DIAGNOSIS — K589 Irritable bowel syndrome without diarrhea: Secondary | ICD-10-CM | POA: Diagnosis not present

## 2018-07-21 DIAGNOSIS — F419 Anxiety disorder, unspecified: Secondary | ICD-10-CM | POA: Diagnosis not present

## 2018-07-21 DIAGNOSIS — E785 Hyperlipidemia, unspecified: Secondary | ICD-10-CM | POA: Diagnosis not present

## 2018-07-21 DIAGNOSIS — G43109 Migraine with aura, not intractable, without status migrainosus: Secondary | ICD-10-CM | POA: Diagnosis not present

## 2018-07-21 DIAGNOSIS — I10 Essential (primary) hypertension: Secondary | ICD-10-CM | POA: Diagnosis not present

## 2018-07-21 DIAGNOSIS — F5101 Primary insomnia: Secondary | ICD-10-CM | POA: Diagnosis not present

## 2018-08-03 DIAGNOSIS — M47816 Spondylosis without myelopathy or radiculopathy, lumbar region: Secondary | ICD-10-CM | POA: Diagnosis not present

## 2018-08-03 DIAGNOSIS — M545 Low back pain: Secondary | ICD-10-CM | POA: Diagnosis not present

## 2018-08-30 DIAGNOSIS — R319 Hematuria, unspecified: Secondary | ICD-10-CM | POA: Diagnosis not present

## 2018-08-30 DIAGNOSIS — Z1389 Encounter for screening for other disorder: Secondary | ICD-10-CM | POA: Diagnosis not present

## 2018-08-30 DIAGNOSIS — Z01419 Encounter for gynecological examination (general) (routine) without abnormal findings: Secondary | ICD-10-CM | POA: Diagnosis not present

## 2018-08-30 DIAGNOSIS — Z6835 Body mass index (BMI) 35.0-35.9, adult: Secondary | ICD-10-CM | POA: Diagnosis not present

## 2018-09-14 DIAGNOSIS — R102 Pelvic and perineal pain: Secondary | ICD-10-CM | POA: Diagnosis not present

## 2018-09-14 DIAGNOSIS — N3010 Interstitial cystitis (chronic) without hematuria: Secondary | ICD-10-CM | POA: Diagnosis not present

## 2018-10-28 DIAGNOSIS — Z86010 Personal history of colonic polyps: Secondary | ICD-10-CM | POA: Diagnosis not present

## 2018-10-28 DIAGNOSIS — K580 Irritable bowel syndrome with diarrhea: Secondary | ICD-10-CM | POA: Diagnosis not present

## 2019-01-06 DIAGNOSIS — Z808 Family history of malignant neoplasm of other organs or systems: Secondary | ICD-10-CM | POA: Diagnosis not present

## 2019-01-06 DIAGNOSIS — K529 Noninfective gastroenteritis and colitis, unspecified: Secondary | ICD-10-CM | POA: Diagnosis not present

## 2019-01-06 DIAGNOSIS — E119 Type 2 diabetes mellitus without complications: Secondary | ICD-10-CM | POA: Diagnosis not present

## 2019-01-06 DIAGNOSIS — E063 Autoimmune thyroiditis: Secondary | ICD-10-CM | POA: Diagnosis not present

## 2019-01-06 DIAGNOSIS — E042 Nontoxic multinodular goiter: Secondary | ICD-10-CM | POA: Diagnosis not present

## 2019-01-06 DIAGNOSIS — E669 Obesity, unspecified: Secondary | ICD-10-CM | POA: Diagnosis not present

## 2019-03-08 DIAGNOSIS — L570 Actinic keratosis: Secondary | ICD-10-CM | POA: Diagnosis not present

## 2019-03-08 DIAGNOSIS — E119 Type 2 diabetes mellitus without complications: Secondary | ICD-10-CM | POA: Diagnosis not present

## 2019-03-08 DIAGNOSIS — Z0000 Encounter for general adult medical examination without abnormal findings: Secondary | ICD-10-CM | POA: Diagnosis not present

## 2019-03-08 DIAGNOSIS — G8929 Other chronic pain: Secondary | ICD-10-CM | POA: Diagnosis not present

## 2019-03-08 DIAGNOSIS — I498 Other specified cardiac arrhythmias: Secondary | ICD-10-CM | POA: Diagnosis not present

## 2019-03-08 DIAGNOSIS — G43109 Migraine with aura, not intractable, without status migrainosus: Secondary | ICD-10-CM | POA: Diagnosis not present

## 2019-03-08 DIAGNOSIS — F419 Anxiety disorder, unspecified: Secondary | ICD-10-CM | POA: Diagnosis not present

## 2019-03-08 DIAGNOSIS — M545 Low back pain: Secondary | ICD-10-CM | POA: Diagnosis not present

## 2019-03-08 DIAGNOSIS — I10 Essential (primary) hypertension: Secondary | ICD-10-CM | POA: Diagnosis not present

## 2019-03-08 DIAGNOSIS — E063 Autoimmune thyroiditis: Secondary | ICD-10-CM | POA: Diagnosis not present

## 2019-03-08 DIAGNOSIS — Z23 Encounter for immunization: Secondary | ICD-10-CM | POA: Diagnosis not present

## 2019-03-08 DIAGNOSIS — K580 Irritable bowel syndrome with diarrhea: Secondary | ICD-10-CM | POA: Diagnosis not present

## 2019-03-30 DIAGNOSIS — R1030 Lower abdominal pain, unspecified: Secondary | ICD-10-CM | POA: Diagnosis not present

## 2019-04-11 DIAGNOSIS — H43813 Vitreous degeneration, bilateral: Secondary | ICD-10-CM | POA: Diagnosis not present

## 2019-04-11 DIAGNOSIS — H524 Presbyopia: Secondary | ICD-10-CM | POA: Diagnosis not present

## 2019-04-11 DIAGNOSIS — E119 Type 2 diabetes mellitus without complications: Secondary | ICD-10-CM | POA: Diagnosis not present

## 2019-04-11 DIAGNOSIS — H2513 Age-related nuclear cataract, bilateral: Secondary | ICD-10-CM | POA: Diagnosis not present

## 2019-04-20 DIAGNOSIS — R102 Pelvic and perineal pain: Secondary | ICD-10-CM | POA: Diagnosis not present

## 2019-05-15 DIAGNOSIS — M545 Low back pain: Secondary | ICD-10-CM | POA: Diagnosis not present

## 2019-05-15 DIAGNOSIS — M25561 Pain in right knee: Secondary | ICD-10-CM | POA: Diagnosis not present

## 2019-05-24 DIAGNOSIS — M47816 Spondylosis without myelopathy or radiculopathy, lumbar region: Secondary | ICD-10-CM | POA: Diagnosis not present

## 2019-05-24 DIAGNOSIS — M545 Low back pain: Secondary | ICD-10-CM | POA: Diagnosis not present

## 2019-07-13 DIAGNOSIS — Z803 Family history of malignant neoplasm of breast: Secondary | ICD-10-CM | POA: Diagnosis not present

## 2019-07-13 DIAGNOSIS — Z1231 Encounter for screening mammogram for malignant neoplasm of breast: Secondary | ICD-10-CM | POA: Diagnosis not present

## 2019-07-24 DIAGNOSIS — M545 Low back pain: Secondary | ICD-10-CM | POA: Diagnosis not present

## 2019-08-03 DIAGNOSIS — M545 Low back pain: Secondary | ICD-10-CM | POA: Diagnosis not present

## 2019-08-03 DIAGNOSIS — M47816 Spondylosis without myelopathy or radiculopathy, lumbar region: Secondary | ICD-10-CM | POA: Diagnosis not present

## 2019-08-13 NOTE — Progress Notes (Signed)
Virtual Visit via Telephone Note   This visit type was conducted due to national recommendations for restrictions regarding the COVID-19 Pandemic (e.g. social distancing) in an effort to limit this patient's exposure and mitigate transmission in our community.  Due to her co-morbid illnesses, this patient is at least at moderate risk for complications without adequate follow up.  This format is felt to be most appropriate for this patient at this time.  The patient did not have access to video technology/had technical difficulties with video requiring transitioning to audio format only (telephone).  All issues noted in this document were discussed and addressed.  No physical exam could be performed with this format.  Please refer to the patient's chart for her  consent to telehealth for [facility].   Date:  08/14/2019   ID:  [name], [name] [birth year], MRN [id number]  Patient Location: Home Provider Location: Home  PCP:  Shirline Frees, MD  Cardiologist:  Mertie Moores, MD   Electrophysiologist:  None   Evaluation Performed:  Follow-Up Visit  Chief Complaint:  SVT  Patient Profile:   [name] is a 69 y.o. female with   Supraventricular Tachycardia  Hypertension   Hyperlipidemia   Diabetes mellitus (diet controlled)  Goiter  Migraine HAs  Ulcerative Colitis   Prior Cardiac Studies: Myoview 11/05/2003  Echocardiogram 02/11/2000 EF 60-65, trace MR, trace TR   History of Present Illness:    [name] was last seen by Dr. Acie Fredrickson in 07/2016.  Today, she is seen for follow-up.  She has not had any issues with rapid palpitations over the past 3 years.  She has not had to use any propranolol.  She has not had chest discomfort, shortness of breath, syncope, leg swelling.  She has had some back issues and is seeing an orthopedist.  She will be getting epidural steroid injections soon.   Past Medical History:  Diagnosis Date  . Arthritis   . Depression     . DM (diabetes mellitus) ([place])    Diet controlled  . Goiter    neg biopsy  . Hyperlipidemia   . Hypertension   . Migraine headache   . SVT (supraventricular tachycardia) ([place])   . Ulcerative colitis ([place])     Current Meds  Medication Sig  . ALPRAZolam (XANAX) 0.5 MG tablet Take 0.5 mg by mouth at bedtime as needed. Anxiety, sleep  . atorvastatin (LIPITOR) 20 MG tablet Take 1 tablet by mouth daily.  . colestipol (COLESTID) 1 g tablet Take 2 tablets by mouth 2 (two) times daily.  Marland Kitchen escitalopram (LEXAPRO) 10 MG tablet Take 10 mg by mouth daily.  . metoprolol (TOPROL-XL) 100 MG 24 hr tablet Take 100 mg by mouth at bedtime.   . Probiotic Product (PRO-BIOTIC BLEND PO) Take 1 capsule by mouth daily.   . propranolol (INDERAL) 10 MG tablet Take 10 mg by mouth daily as needed. For svt episodes   . rizatriptan (MAXALT) 10 MG tablet Take 10 mg by mouth daily as needed. May repeat in 2 hours if needed. For migraines  . traMADol (ULTRAM) 50 MG tablet Take 50 mg by mouth 2 (two) times daily as needed.  . zolpidem (AMBIEN) 5 MG tablet Take 5 mg by mouth at bedtime.  . [DISCONTINUED] atorvastatin (LIPITOR) 20 MG tablet Take 1 tablet (20 mg total) by mouth daily. Patient needs to call and schedule an appointment for further refills 3rd/final attempt (Patient taking differently: Take 20 mg by mouth daily. )  Allergies:   Amoxicillin, Biaxin [clarithromycin], Crestor [rosuvastatin calcium], Doxycycline, Penicillins, Pravachol, and Topamax [topiramate]   Social History   Tobacco Use  . Smoking status: Never Smoker  . Smokeless tobacco: Never Used  Substance Use Topics  . Alcohol use: Yes    Comment: Rare  . Drug use: No     Family Hx: The patient's family history includes Aneurysm in her father; Breast cancer in her paternal aunt; Myocarditis in her mother.   Labs/Other Tests and Data Reviewed:    EKG:  No ECG reviewed.  Recent Labs: No results found for requested labs within last  8760 hours.   Recent Lipid Panel Lab Results  Component Value Date/Time   CHOL 218 (H) 07/02/2016 11:12 AM   TRIG 205 (H) 07/02/2016 11:12 AM   HDL 39 (L) 07/02/2016 11:12 AM   CHOLHDL 5.6 (H) 07/02/2016 11:12 AM   LDLCALC 138 (H) 07/02/2016 11:12 AM   LDLDIRECT 162.2 09/06/2012 10:07 AM     Wt Readings from Last 3 Encounters:  08/14/19 219 lb (99.3 kg)  02/16/18 226 lb (102.5 kg)  08/03/16 231 lb 12.8 oz (105.1 kg)     Objective:    Vital Signs:  BP 112/74   Ht 5\' 6"  (1.676 m)   Wt 219 lb (99.3 kg)   BMI 35.35 kg/m    VITAL SIGNS:  reviewed GEN:  no acute distress RESPIRATORY:  No labored breathing noted NEURO:  Alert and oriented PSYCH:  Normal mood  ASSESSMENT & PLAN:    1. SVT (supraventricular tachycardia) (HCC) Overall well controlled on beta-blocker therapy.  She has not had to use as needed beta-blocker.  Continue metoprolol succinate 100 mg daily.  2. Essential hypertension The patient's blood pressure is controlled on her current regimen.  Continue current therapy.   3. Pure hypercholesterolemia Managed by primary care.  Most recent lipid panel in January 2020 was reviewed.  Her lipids are well controlled.  4. Type 2 diabetes mellitus without complication, without long-term current use of insulin ([place]) Well-controlled on diet therapy only.  Recent A1c 6.2.  This is managed by primary care and endocrinology.   Time:   Today, I have spent 15 minutes with the patient with telehealth technology discussing the above problems.     Medication Adjustments/Labs and Tests Ordered: Current medicines are reviewed at length with the patient today.  Concerns regarding medicines are outlined above.   Tests Ordered: No orders of the defined types were placed in this encounter.   Medication Changes: No orders of the defined types were placed in this encounter.   Follow Up:  In Person in 2 year(s)  Signed, Richardson Dopp, PA-C  08/14/2019 9:06 AM    [place]

## 2019-08-14 ENCOUNTER — Telehealth (INDEPENDENT_AMBULATORY_CARE_PROVIDER_SITE_OTHER): Payer: Medicare Other | Admitting: Physician Assistant

## 2019-08-14 ENCOUNTER — Encounter: Payer: Self-pay | Admitting: Physician Assistant

## 2019-08-14 ENCOUNTER — Other Ambulatory Visit: Payer: Self-pay

## 2019-08-14 VITALS — BP 112/74 | Ht 66.0 in | Wt 219.0 lb

## 2019-08-14 DIAGNOSIS — E119 Type 2 diabetes mellitus without complications: Secondary | ICD-10-CM

## 2019-08-14 DIAGNOSIS — I10 Essential (primary) hypertension: Secondary | ICD-10-CM

## 2019-08-14 DIAGNOSIS — I471 Supraventricular tachycardia: Secondary | ICD-10-CM | POA: Diagnosis not present

## 2019-08-14 DIAGNOSIS — E7800 Pure hypercholesterolemia, unspecified: Secondary | ICD-10-CM

## 2019-08-14 NOTE — Patient Instructions (Signed)
Medication Instructions:   Your physician recommends that you continue on your current medications as directed. Please refer to the Current Medication list given to you today.  *If you need a refill on your cardiac medications before your next appointment, please call your pharmacy*  Lab Work:  None ordered today  If you have labs (blood work) drawn today and your tests are completely normal, you will receive your results only by: Marland Kitchen MyChart Message (if you have MyChart) OR . A paper copy in the mail If you have any lab test that is abnormal or we need to change your treatment, we will call you to review the results.  Testing/Procedures:  None ordered today  Follow-Up: At [facility], you and your health needs are our priority.  As part of our continuing mission to provide you with exceptional heart care, we have created designated Provider Care Teams.  These Care Teams include your primary Cardiologist (physician) and Advanced Practice Providers (APPs -  Physician Assistants and Nurse Practitioners) who all work together to provide you with the care you need, when you need it.  Your next appointment:   2 year(s)  The format for your next appointment:   In Person  Provider:   You may see Mertie Moores, MD or one of the following Advanced Practice Providers on your designated Care Team:    Richardson Dopp, PA-C  [place], [place]  Daune Perch, [place]

## 2019-08-23 DIAGNOSIS — M545 Low back pain: Secondary | ICD-10-CM | POA: Diagnosis not present

## 2019-08-23 DIAGNOSIS — M47816 Spondylosis without myelopathy or radiculopathy, lumbar region: Secondary | ICD-10-CM | POA: Diagnosis not present

## 2019-09-12 DIAGNOSIS — M47816 Spondylosis without myelopathy or radiculopathy, lumbar region: Secondary | ICD-10-CM | POA: Diagnosis not present

## 2019-09-12 DIAGNOSIS — R300 Dysuria: Secondary | ICD-10-CM | POA: Diagnosis not present

## 2019-09-12 DIAGNOSIS — M545 Low back pain: Secondary | ICD-10-CM | POA: Diagnosis not present

## 2019-09-12 DIAGNOSIS — N3001 Acute cystitis with hematuria: Secondary | ICD-10-CM | POA: Diagnosis not present

## 2019-09-27 DIAGNOSIS — M47816 Spondylosis without myelopathy or radiculopathy, lumbar region: Secondary | ICD-10-CM | POA: Diagnosis not present

## 2019-09-27 DIAGNOSIS — M545 Low back pain: Secondary | ICD-10-CM | POA: Diagnosis not present

## 2019-10-17 DIAGNOSIS — M47816 Spondylosis without myelopathy or radiculopathy, lumbar region: Secondary | ICD-10-CM | POA: Diagnosis not present

## 2019-10-17 DIAGNOSIS — M545 Low back pain: Secondary | ICD-10-CM | POA: Diagnosis not present

## 2019-10-31 DIAGNOSIS — Z01419 Encounter for gynecological examination (general) (routine) without abnormal findings: Secondary | ICD-10-CM | POA: Diagnosis not present

## 2019-11-02 DIAGNOSIS — M47816 Spondylosis without myelopathy or radiculopathy, lumbar region: Secondary | ICD-10-CM | POA: Diagnosis not present

## 2019-11-02 DIAGNOSIS — M545 Low back pain: Secondary | ICD-10-CM | POA: Diagnosis not present

## 2019-11-23 DIAGNOSIS — M47816 Spondylosis without myelopathy or radiculopathy, lumbar region: Secondary | ICD-10-CM | POA: Diagnosis not present

## 2019-11-23 DIAGNOSIS — M545 Low back pain: Secondary | ICD-10-CM | POA: Diagnosis not present

## 2020-01-16 DIAGNOSIS — M47816 Spondylosis without myelopathy or radiculopathy, lumbar region: Secondary | ICD-10-CM | POA: Diagnosis not present

## 2020-01-16 DIAGNOSIS — M545 Low back pain: Secondary | ICD-10-CM | POA: Diagnosis not present

## 2020-01-29 IMAGING — DX DG CERVICAL SPINE 2 OR 3 VIEWS
4 series · 4 of 4 positions shown · non-contrast
Comparison: None.

CLINICAL DATA: Chronic neck pain.  No known injury.

EXAM:
CERVICAL SPINE - 2-3 VIEW

[dg cervical spine 2 or 3 views (1 of 4)]
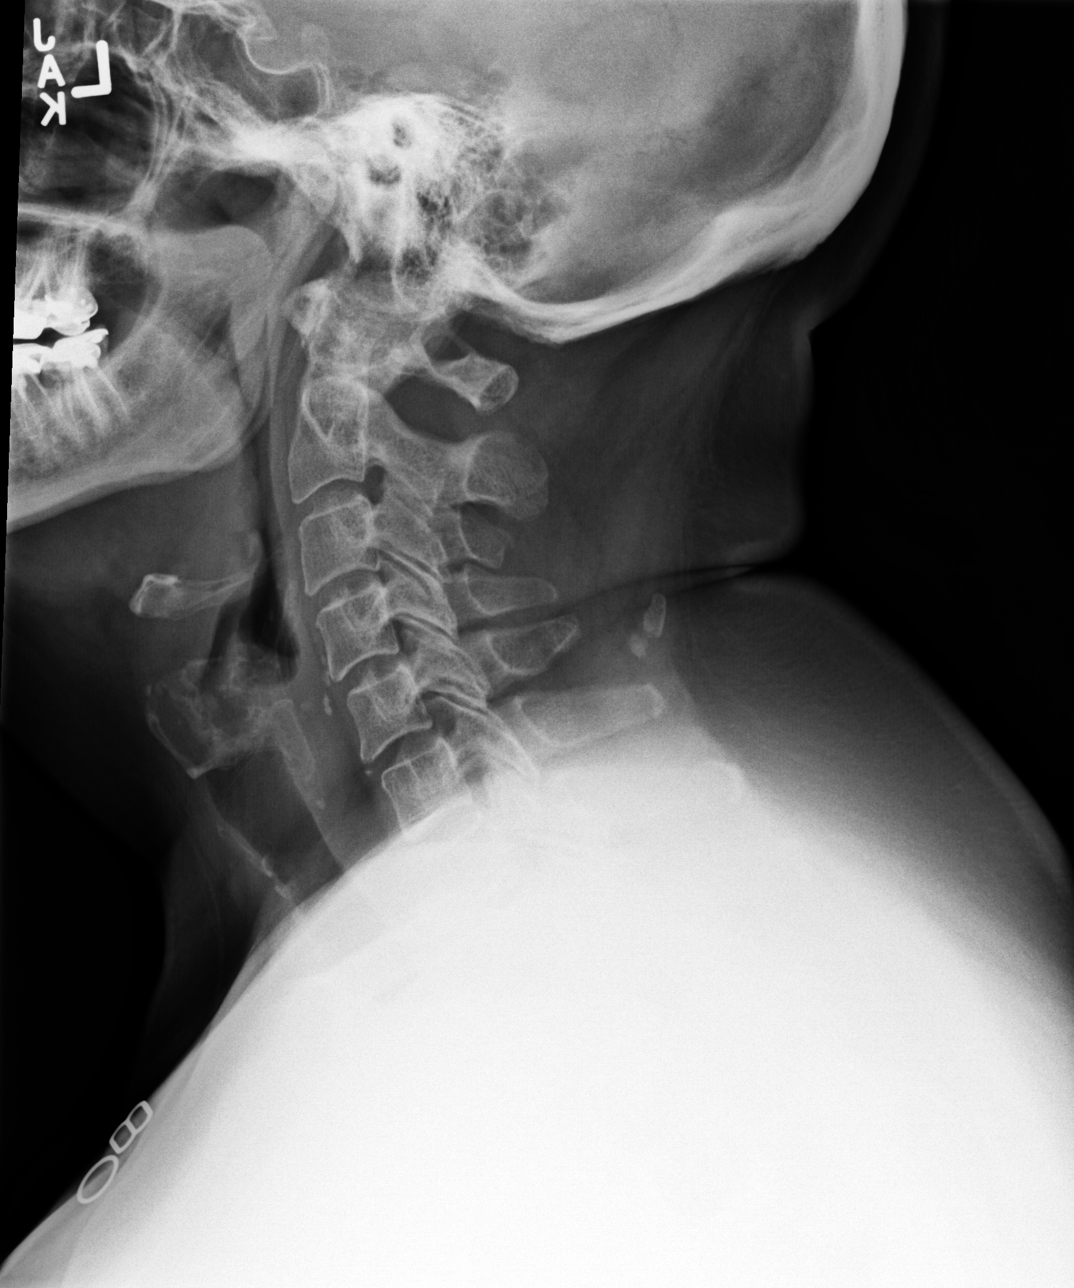

[dg cervical spine 2 or 3 views (2 of 4)]
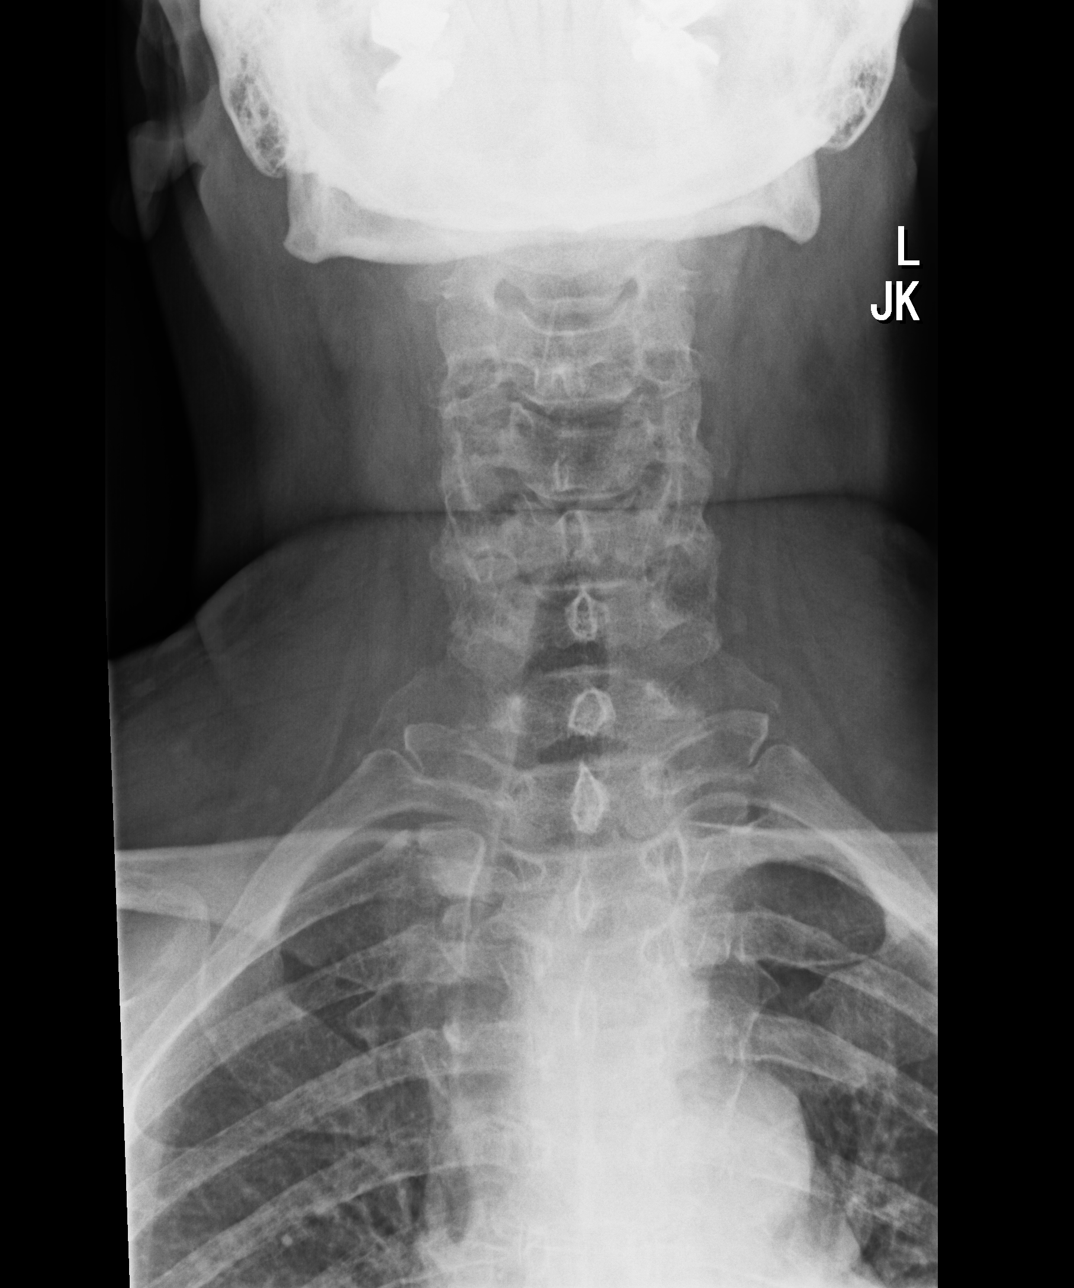

[dg cervical spine 2 or 3 views (3 of 4)]
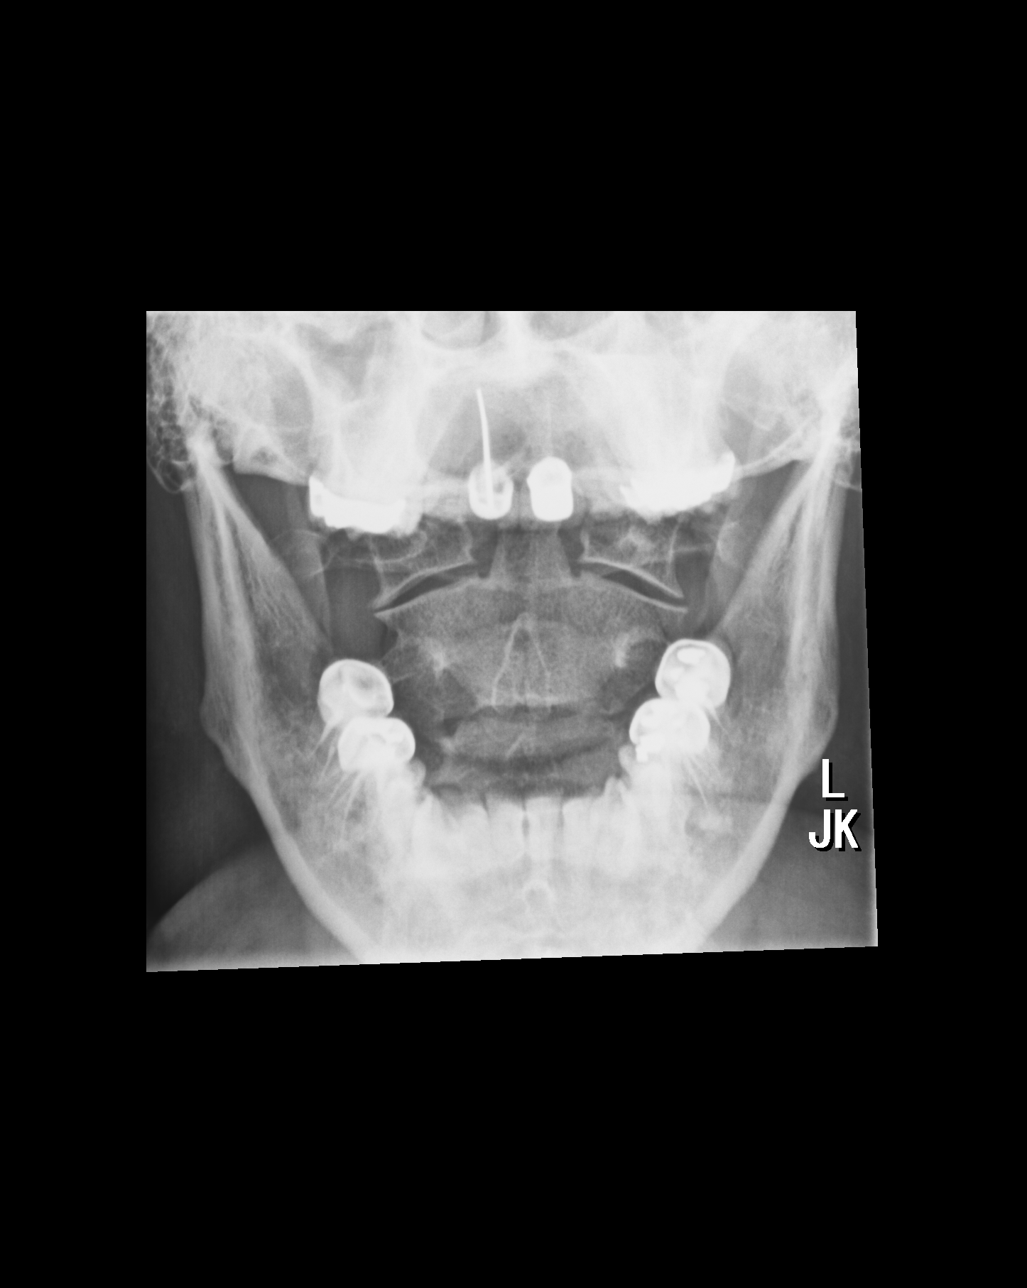

[dg cervical spine 2 or 3 views (4 of 4)]
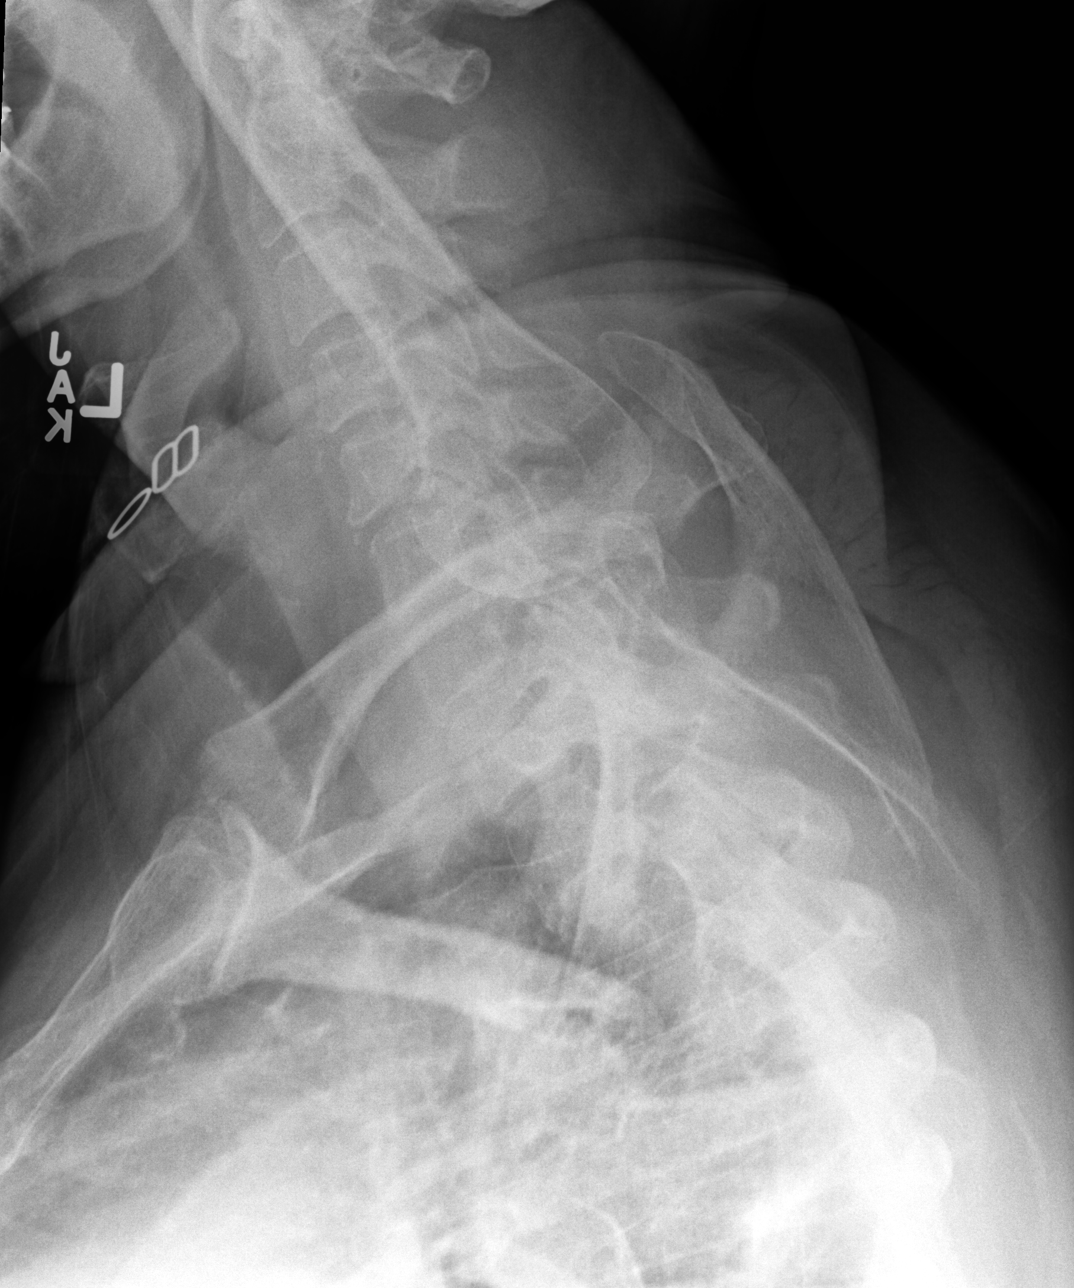

[4 of 4 positions shown; findings below may reference images not displayed]

FINDINGS: Normal alignment. No fracture. Disc spaces maintained. Mild diffuse
degenerative facet disease.
IMPRESSION: Mild degenerative facet disease.  No acute bony abnormality.

## 2020-02-13 DIAGNOSIS — J0110 Acute frontal sinusitis, unspecified: Secondary | ICD-10-CM | POA: Diagnosis not present

## 2020-03-08 DIAGNOSIS — F5101 Primary insomnia: Secondary | ICD-10-CM | POA: Diagnosis not present

## 2020-03-08 DIAGNOSIS — K580 Irritable bowel syndrome with diarrhea: Secondary | ICD-10-CM | POA: Diagnosis not present

## 2020-03-08 DIAGNOSIS — Z0000 Encounter for general adult medical examination without abnormal findings: Secondary | ICD-10-CM | POA: Diagnosis not present

## 2020-03-08 DIAGNOSIS — G8929 Other chronic pain: Secondary | ICD-10-CM | POA: Diagnosis not present

## 2020-03-08 DIAGNOSIS — G43109 Migraine with aura, not intractable, without status migrainosus: Secondary | ICD-10-CM | POA: Diagnosis not present

## 2020-03-08 DIAGNOSIS — F419 Anxiety disorder, unspecified: Secondary | ICD-10-CM | POA: Diagnosis not present

## 2020-03-08 DIAGNOSIS — E7800 Pure hypercholesterolemia, unspecified: Secondary | ICD-10-CM | POA: Diagnosis not present

## 2020-03-08 DIAGNOSIS — E119 Type 2 diabetes mellitus without complications: Secondary | ICD-10-CM | POA: Diagnosis not present

## 2020-03-08 DIAGNOSIS — I10 Essential (primary) hypertension: Secondary | ICD-10-CM | POA: Diagnosis not present

## 2020-03-08 DIAGNOSIS — Z1159 Encounter for screening for other viral diseases: Secondary | ICD-10-CM | POA: Diagnosis not present

## 2020-03-08 DIAGNOSIS — E063 Autoimmune thyroiditis: Secondary | ICD-10-CM | POA: Diagnosis not present

## 2020-03-21 DIAGNOSIS — Z23 Encounter for immunization: Secondary | ICD-10-CM | POA: Diagnosis not present

## 2020-04-15 DIAGNOSIS — H2513 Age-related nuclear cataract, bilateral: Secondary | ICD-10-CM | POA: Diagnosis not present

## 2020-04-15 DIAGNOSIS — H524 Presbyopia: Secondary | ICD-10-CM | POA: Diagnosis not present

## 2020-04-15 DIAGNOSIS — H43813 Vitreous degeneration, bilateral: Secondary | ICD-10-CM | POA: Diagnosis not present

## 2020-04-15 DIAGNOSIS — R7303 Prediabetes: Secondary | ICD-10-CM | POA: Diagnosis not present

## 2020-04-29 DIAGNOSIS — M47816 Spondylosis without myelopathy or radiculopathy, lumbar region: Secondary | ICD-10-CM | POA: Diagnosis not present

## 2020-04-29 DIAGNOSIS — M545 Low back pain, unspecified: Secondary | ICD-10-CM | POA: Diagnosis not present

## 2020-04-29 DIAGNOSIS — M25551 Pain in right hip: Secondary | ICD-10-CM | POA: Diagnosis not present

## 2020-05-15 DIAGNOSIS — M25551 Pain in right hip: Secondary | ICD-10-CM | POA: Diagnosis not present

## 2020-06-10 DIAGNOSIS — M5416 Radiculopathy, lumbar region: Secondary | ICD-10-CM | POA: Diagnosis not present

## 2020-06-10 DIAGNOSIS — M47816 Spondylosis without myelopathy or radiculopathy, lumbar region: Secondary | ICD-10-CM | POA: Diagnosis not present

## 2020-06-10 DIAGNOSIS — M545 Low back pain, unspecified: Secondary | ICD-10-CM | POA: Diagnosis not present

## 2020-06-12 DIAGNOSIS — Z23 Encounter for immunization: Secondary | ICD-10-CM | POA: Diagnosis not present

## 2020-06-15 DIAGNOSIS — M545 Low back pain, unspecified: Secondary | ICD-10-CM | POA: Diagnosis not present

## 2020-06-20 DIAGNOSIS — M5416 Radiculopathy, lumbar region: Secondary | ICD-10-CM | POA: Diagnosis not present

## 2020-06-20 DIAGNOSIS — M47816 Spondylosis without myelopathy or radiculopathy, lumbar region: Secondary | ICD-10-CM | POA: Diagnosis not present

## 2020-06-20 DIAGNOSIS — M545 Low back pain, unspecified: Secondary | ICD-10-CM | POA: Diagnosis not present

## 2020-07-03 DIAGNOSIS — M5416 Radiculopathy, lumbar region: Secondary | ICD-10-CM | POA: Diagnosis not present

## 2020-07-03 DIAGNOSIS — M47816 Spondylosis without myelopathy or radiculopathy, lumbar region: Secondary | ICD-10-CM | POA: Diagnosis not present

## 2020-07-18 DIAGNOSIS — Z1231 Encounter for screening mammogram for malignant neoplasm of breast: Secondary | ICD-10-CM | POA: Diagnosis not present

## 2020-07-30 DIAGNOSIS — M5416 Radiculopathy, lumbar region: Secondary | ICD-10-CM | POA: Diagnosis not present

## 2020-07-30 DIAGNOSIS — M47816 Spondylosis without myelopathy or radiculopathy, lumbar region: Secondary | ICD-10-CM | POA: Diagnosis not present

## 2020-08-07 DIAGNOSIS — M5416 Radiculopathy, lumbar region: Secondary | ICD-10-CM | POA: Diagnosis not present

## 2020-08-29 DIAGNOSIS — M545 Low back pain, unspecified: Secondary | ICD-10-CM | POA: Diagnosis not present

## 2020-08-29 DIAGNOSIS — M47816 Spondylosis without myelopathy or radiculopathy, lumbar region: Secondary | ICD-10-CM | POA: Diagnosis not present

## 2020-08-29 DIAGNOSIS — M5416 Radiculopathy, lumbar region: Secondary | ICD-10-CM | POA: Diagnosis not present

## 2020-09-03 DIAGNOSIS — M4807 Spinal stenosis, lumbosacral region: Secondary | ICD-10-CM | POA: Diagnosis not present

## 2020-09-10 ENCOUNTER — Other Ambulatory Visit: Payer: Self-pay | Admitting: Orthopedic Surgery

## 2020-09-11 ENCOUNTER — Telehealth: Payer: Self-pay | Admitting: *Deleted

## 2020-09-11 DIAGNOSIS — E119 Type 2 diabetes mellitus without complications: Secondary | ICD-10-CM | POA: Diagnosis not present

## 2020-09-11 DIAGNOSIS — G8929 Other chronic pain: Secondary | ICD-10-CM | POA: Diagnosis not present

## 2020-09-11 DIAGNOSIS — F320 Major depressive disorder, single episode, mild: Secondary | ICD-10-CM | POA: Diagnosis not present

## 2020-09-11 DIAGNOSIS — G43109 Migraine with aura, not intractable, without status migrainosus: Secondary | ICD-10-CM | POA: Diagnosis not present

## 2020-09-11 DIAGNOSIS — M5441 Lumbago with sciatica, right side: Secondary | ICD-10-CM | POA: Diagnosis not present

## 2020-09-11 DIAGNOSIS — F5101 Primary insomnia: Secondary | ICD-10-CM | POA: Diagnosis not present

## 2020-09-11 DIAGNOSIS — Z6837 Body mass index (BMI) 37.0-37.9, adult: Secondary | ICD-10-CM | POA: Diagnosis not present

## 2020-09-11 DIAGNOSIS — F419 Anxiety disorder, unspecified: Secondary | ICD-10-CM | POA: Diagnosis not present

## 2020-09-11 DIAGNOSIS — I10 Essential (primary) hypertension: Secondary | ICD-10-CM | POA: Diagnosis not present

## 2020-09-11 DIAGNOSIS — E7800 Pure hypercholesterolemia, unspecified: Secondary | ICD-10-CM | POA: Diagnosis not present

## 2020-09-11 DIAGNOSIS — G250 Essential tremor: Secondary | ICD-10-CM | POA: Diagnosis not present

## 2020-09-11 NOTE — Telephone Encounter (Signed)
   [facility] Pre-operative Risk Assessment    HEARTCARE STAFF: - Please ensure there is not already an duplicate clearance open for this procedure. - Under Visit Info/Reason for Call, type in Other and utilize the format Clearance MM/DD/YY or Clearance TBD. Do not use dashes or single digits. - If request is for dental extraction, please clarify the # of teeth to be extracted.  Request for surgical clearance:  1. What type of surgery is being performed? RIGHT SIDED LUMBAR 3-4, 4-5 TRANSFORAMINAL LUMBAR INTERBODY FUSION WITH INSTRUMENTATION AND ALLOGRAFT   2. When is this surgery scheduled? 11/06/20   3. What type of clearance is required (medical clearance vs. Pharmacy clearance to hold med vs. Both)? MEDICAL  4. Are there any medications that need to be held prior to surgery and how long? NONE LISTED   5. Practice name and name of physician performing surgery? [facility]; DR. Salem   6. What is the office phone number? [phone]   7.   What is the office fax number? [place]  8.   Anesthesia type (None, local, MAC, general) ? NOT LISTED   Julaine Hua 09/11/2020, 1:26 PM  _________________________________________________________________   (provider comments below)

## 2020-09-13 NOTE — Telephone Encounter (Signed)
   Primary Cardiologist: Mertie Moores, MD  Chart reviewed as part of pre-operative protocol coverage. Because of [name]'s past medical history and time since last visit, she will require a follow-up visit in order to better assess preoperative cardiovascular risk. She was last seen 08/14/19 via telemedicine.   Pre-op covering staff: - Please schedule appointment and call patient to inform them. If patient already had an upcoming appointment within acceptable timeframe, please add "pre-op clearance" to the appointment notes so provider is aware. - Please contact requesting surgeon's office via preferred method (i.e, phone, fax) to inform them of need for appointment prior to surgery.  Rachael Dubonnet, NP  09/13/2020, 9:46 AM

## 2020-09-13 NOTE — Telephone Encounter (Signed)
Spoke with patient who is agreeable to see Dr. Acie Fredrickson on 4/20 at 9:20 am. Pt thanked me for the call. I will fax over recommendations to requesting surgeon's office.

## 2020-10-29 NOTE — Progress Notes (Signed)
[name] Date of Birth  December 22, 1950       [facility] [address], [place], [place]  [address]   [place], [place]  [address] [phone]     [phone]   Fax  [phone]    Fax [phone]  Problem List: 1. Hypertension 2. Supraventricular tachycardia 3. Hyperlipidemia   Previous notes :   Carlette is a 70 yo with hx as noted above.  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    she had one episode of supraventricular tachycardia last year that the propranolol with resolution of the episode.   Nov 22, 2013:  Khylei is doing well.  No CP ,  No dyspnea.  BP has been well controlled.    March 04, 2015:  Trying to stay active.  Had gained some weight and has lost most of it No CP , no dyspnea   Jan. 22, 2018:  Latrica is seen back today after a 1-1/2 year absence. Had some chest pressure in Aug. 2017.   Went to the ER .  Was diagnosed with GERD .   Has been more careful with her diet - eating smaller meals.   October 30, 2020: Jenna is seen back after 4 years  She was "seen" by Richardson Dopp, PA via telemedicine in 2021 for follow up of her SVT, HTN, HLD  Wt is 231 lbs.  Is not getting any exercise Is here for pre-op evaluation prior to back surgery  Surgery planned for April 27.  No CP , no dyspnea,  No episodes of SVT .   Still on metoprolol  Back pain is causing pain with daily activities     Current Outpatient Medications on File Prior to Visit  Medication Sig Dispense Refill  . ALPRAZolam (XANAX) 0.5 MG tablet Take 0.5 mg by mouth at bedtime as needed for anxiety or sleep.    Marland Kitchen atorvastatin (LIPITOR) 20 MG tablet Take 20 mg by mouth daily.    . colestipol (COLESTID) 1 g tablet Take 2 g by mouth 2 (two) times daily.    . diphenhydrAMINE (BENADRYL) 25 MG tablet Take 25 mg by mouth daily.    . metoprolol (TOPROL-XL) 100 MG 24 hr tablet Take 50 mg by mouth at  bedtime.    . Multiple Vitamins-Minerals (MULTIVITAMIN WITH MINERALS) tablet Take 1 tablet by mouth daily.    . Probiotic Product (PRO-BIOTIC BLEND PO) Take 1 capsule by mouth daily.     . propranolol (INDERAL) 10 MG tablet Take 10 mg by mouth daily as needed. For svt episodes    . rizatriptan (MAXALT) 10 MG tablet Take 10 mg by mouth daily as needed. May repeat in 2 hours if needed. For migraines    . sertraline (ZOLOFT) 100 MG tablet Take 200 mg by mouth daily.    . traMADol (ULTRAM) 50 MG tablet Take 50 mg by mouth 2 (two) times daily as needed for severe pain.  5  . zolpidem (AMBIEN) 10 MG tablet Take 10 mg by mouth at bedtime.     No current facility-administered medications on file prior to visit.    Allergies  Allergen Reactions  . Amoxicillin     REACTION: Hives  . Biaxin [Clarithromycin]     REACTION: Hives  . Crestor [Rosuvastatin Calcium]     ARM PAIN  . Doxycycline  REACTION: Hives  . Penicillins     REACTION: Hives  . Pravachol     ARM PAIN  . Topamax [Topiramate] Anxiety    Past Medical History:  Diagnosis Date  . Arthritis   . Depression   . DM (diabetes mellitus) ([place])    Diet controlled  . Goiter    neg biopsy  . Hyperlipidemia   . Hypertension   . Migraine headache   . SVT (supraventricular tachycardia) ([place])   . Ulcerative colitis [facility])     Past Surgical History:  Procedure Laterality Date  . ABDOMINAL HYSTERECTOMY    . ACHILLES TENDON REPAIR    . APPENDECTOMY    . CARDIOVASCULAR STRESS TEST  11/05/2003   EF 68%  . CHOLECYSTECTOMY    . DOPPLER ECHOCARDIOGRAPHY  02/11/2000   EF 60-65%  . FOOT SURGERY    . NOSE SURGERY    . TOTAL KNEE ARTHROPLASTY     right knee    Social History   Tobacco Use  Smoking Status Never Smoker  Smokeless Tobacco Never Used    Social History   Substance and Sexual Activity  Alcohol Use Yes   Comment: Rare    Family History  Problem Relation Age of Onset  . Myocarditis Mother   . Aneurysm Father    . Breast cancer Paternal Aunt     Reviw of Systems:  Reviewed in the HPI.  All other systems are negative.  Physical Exam: Blood pressure 112/70, pulse 78, height 5\' 6"  (1.676 m), weight 231 lb 9.6 oz (105.1 kg), SpO2 95 %.  GEN:  Middle age, moderately obese female  HEENT: Normal NECK: No JVD; No carotid bruits LYMPHATICS: No lymphadenopathy CARDIAC: RRR , no murmurs, rubs, gallops RESPIRATORY:  Clear to auscultation without rales, wheezing or rhonchi  ABDOMEN: Soft, non-tender, non-distended MUSCULOSKELETAL:  No edema; No deformity  SKIN: Warm and dry NEUROLOGIC:  Alert and oriented x 3  ECG: October 30, 2020: Normal sinus rhythm at 78.  Right bundle branch block.  No changes from previous EKG.  Assessment / Plan:   1. Hypertension -   BP is well controlled.  Continue current medications.   2. Supraventricular tachycardia -continue metoprolol.  She is not having any recent episodes of supraventricular tachycardia   3. Hyperlipidemia -I have not seen her in over 4 years.  Her medical doctor appears to be managing her hyperlipidemia.   4.  Back pain: She is here partly because of preop evaluation prior to back surgery.  She is not had any episodes of chest pain, shortness of breath or supraventricular tachycardia.  She is at low risk for her upcoming back surgery.   Mertie Moores, MD  10/30/2020 9:51 AM    [address] [place],  [place], [place]  [address] Pager [phone] Phone: [phone]; Fax: [phone]

## 2020-10-30 ENCOUNTER — Other Ambulatory Visit: Payer: Self-pay

## 2020-10-30 ENCOUNTER — Ambulatory Visit (INDEPENDENT_AMBULATORY_CARE_PROVIDER_SITE_OTHER): Payer: Medicare Other | Admitting: Cardiovascular Disease

## 2020-10-30 ENCOUNTER — Encounter: Payer: Self-pay | Admitting: Cardiovascular Disease

## 2020-10-30 VITALS — BP 112/70 | HR 78 | Ht 66.0 in | Wt 231.6 lb

## 2020-10-30 DIAGNOSIS — I471 Supraventricular tachycardia: Secondary | ICD-10-CM | POA: Diagnosis not present

## 2020-10-30 NOTE — Patient Instructions (Signed)
Medication Instructions:  Your physician recommends that you continue on your current medications as directed. Please refer to the Current Medication list given to you today.   *If you need a refill on your cardiac medications before your next appointment, please call your pharmacy*   Lab Work: none If you have labs (blood work) drawn today and your tests are completely normal, you will receive your results only by: Marland Kitchen MyChart Message (if you have MyChart) OR . A paper copy in the mail If you have any lab test that is abnormal or we need to change your treatment, we will call you to review the results.   Testing/Procedures: none   Follow-Up: At [facility], you and your health needs are our priority.  As part of our continuing mission to provide you with exceptional heart care, we have created designated Provider Care Teams.  These Care Teams include your primary Cardiologist (physician) and Advanced Practice Providers (APPs -  Physician Assistants and Nurse Practitioners) who all work together to provide you with the care you need, when you need it.   Your next appointment:   1 year(s)  The format for your next appointment:   In Person  Provider:   You will see one of the following Advanced Practice Providers on your designated Care Team:    Richardson Dopp, PA-C  [place], [place]

## 2020-10-30 NOTE — Telephone Encounter (Signed)
Pt is at low risk for her back surgery.      Mertie Moores, MD  10/30/2020 9:54 AM    [place],  [place], [place]  [address] Phone: [phone]; Fax: [phone]

## 2020-11-01 NOTE — Pre-Procedure Instructions (Signed)
Surgical Instructions    Your procedure is scheduled on Wednesday April 27th.   Report to [facility] Main Entrance "A" at 0530 A.M., then check in with the Admitting office.  Call this number if you have problems the morning of surgery:  [phone]   If you have any questions prior to your surgery date call [phone]: Open Monday-Friday 8am-4pm    Remember:  Do not eat after midnight the night before your surgery  You may drink clear liquids until 0430am the morning of your surgery.   Clear liquids allowed are: Water, Non-Citrus Juices (without pulp), Carbonated Beverages, Clear Tea, Black Coffee Only, and Gatorade  Patient Instructions  . The night before surgery:  o No food after midnight. ONLY clear liquids after midnight    . The day of surgery (if you have diabetes): o Drink ONE (1) 10 oz water bottle given to you in your pre admission testing appointment by 0430am the morning of surgery. Drink in one sitting. Do not sip.  o This drink was given to you during your hospital  pre-op appointment visit.  o Nothing else to drink after completing the  10 oz bottle of water.          If you have questions, please contact your surgeon's office.     Take these medicines the morning of surgery with A SIP OF WATER   atorvastatin (LIPITOR)  colestipol (COLESTID)  diphenhydrAMINE (BENADRYL)  sertraline (ZOLOFT)     Take these medicines if needed:  ALPRAZolam Duanne Moron)  propranolol (INDERAL)  rizatriptan (MAXALT)    traMADol (ULTRAM)       As of today, STOP taking any Aspirin (unless otherwise instructed by your surgeon) Aleve, Naproxen, Ibuprofen, Motrin, Advil, Goody's, BC's, all herbal medications, fish oil, and all vitamins.                     Do not wear jewelry, make up, or nail polish            Do not wear lotions, powders, perfumes, or deodorant.            Do not shave 48 hours prior to surgery.              Do not bring valuables to the hospital.             [facility] is not responsible for any belongings or valuables.  Do NOT Smoke (Tobacco/Vaping) or drink Alcohol 24 hours prior to your procedure If you use a CPAP at night, you may bring all equipment for your overnight stay.   Contacts, glasses, dentures or partials may not be worn into surgery, please bring cases for these belongings   For patients admitted to the hospital, discharge time will be determined by your treatment team.   Patients discharged the day of surgery will not be allowed to drive home, and someone needs to stay with them for 24 hours.    Special instructions:   [place]- Preparing For Surgery  Before surgery, you can play an important role. Because skin is not sterile, your skin needs to be as free of germs as possible. You can reduce the number of germs on your skin by washing with CHG (chlorahexidine gluconate) Soap before surgery.  CHG is an antiseptic cleaner which kills germs and bonds with the skin to continue killing germs even after washing.    Oral Hygiene is also important to reduce your risk of infection.  Remember -  BRUSH YOUR TEETH THE MORNING OF SURGERY WITH YOUR REGULAR TOOTHPASTE  Please do not use if you have an allergy to CHG or antibacterial soaps. If your skin becomes reddened/irritated stop using the CHG.  Do not shave (including legs and underarms) for at least 48 hours prior to first CHG shower. It is OK to shave your face.  Please follow these instructions carefully.   1. Shower the NIGHT BEFORE SURGERY and the MORNING OF SURGERY  2. If you chose to wash your hair, wash your hair first as usual with your normal shampoo.  3. After you shampoo, rinse your hair and body thoroughly to remove the shampoo.  4. Wash Face and genitals (private parts) with your normal soap.   5.  Shower the NIGHT BEFORE SURGERY and the MORNING OF SURGERY with CHG Soap.   6. Use CHG Soap as you would any other liquid soap. You can apply CHG directly to the  skin and wash gently with a scrungie or a clean washcloth.   7. Apply the CHG Soap to your body ONLY FROM THE NECK DOWN.  Do not use on open wounds or open sores. Avoid contact with your eyes, ears, mouth and genitals (private parts). Wash Face and genitals (private parts)  with your normal soap.   8. Wash thoroughly, paying special attention to the area where your surgery will be performed.  9. Thoroughly rinse your body with warm water from the neck down.  10. DO NOT shower/wash with your normal soap after using and rinsing off the CHG Soap.  11. Pat yourself dry with a CLEAN TOWEL.  12. Wear CLEAN PAJAMAS to bed the night before surgery  13. Place CLEAN SHEETS on your bed the night before your surgery  14. DO NOT SLEEP WITH PETS.   Day of Surgery: Take a shower with CHG soap. Wear Clean/Comfortable clothing the morning of surgery Do not apply any deodorants/lotions.   Remember to brush your teeth WITH YOUR REGULAR TOOTHPASTE.   Please read over the following fact sheets that you were given.

## 2020-11-01 NOTE — Pre-Procedure Instructions (Signed)
Surgical Instructions    Your procedure is scheduled on Wednesday April 27th.   Report to [facility] Main Entrance "A" at 0530 A.M., then check in with the Admitting office.  Call this number if you have problems the morning of surgery:  [phone]   If you have any questions prior to your surgery date call [phone]: Open Monday-Friday 8am-4pm    Remember:  Do not eat after midnight the night before your surgery  You may drink clear liquids until 0430 the morning of your surgery.   Clear liquids allowed are: Water, Non-Citrus Juices (without pulp), Carbonated Beverages, Clear Tea, Black Coffee Only, and Gatorade  Patient Instructions  . The night before surgery:  o No food after midnight. ONLY clear liquids after midnight    . The day of surgery (if you have diabetes): o Drink ONE (1) 10 oz water bottle given to you in your pre admission testing appointment by 0430 the morning of surgery. Drink in one sitting. Do not sip.  o This drink was given to you during your hospital  pre-op appointment visit.  o Nothing else to drink after completing the  10 oz bottle of water.          If you have questions, please contact your surgeon's office.     Take these medicines the morning of surgery with A SIP OF WATER   atorvastatin (LIPITOR)  colestipol (COLESTID)  diphenhydrAMINE (BENADRYL)  sertraline (ZOLOFT)     Take these medicines if needed:  ALPRAZolam Duanne Moron)  propranolol (INDERAL)  rizatriptan (MAXALT)    traMADol (ULTRAM)       As of today, STOP taking any Aspirin (unless otherwise instructed by your surgeon) Aleve, Naproxen, Ibuprofen, Motrin, Advil, Goody's, BC's, all herbal medications, fish oil, and all vitamins.                     Do not wear jewelry, make up, or nail polish            Do not wear lotions, powders, perfumes, or deodorant.            Do not shave 48 hours prior to surgery.              Do not bring valuables to the hospital.             [facility] is not responsible for any belongings or valuables.  Do NOT Smoke (Tobacco/Vaping) or drink Alcohol 24 hours prior to your procedure If you use a CPAP at night, you may bring all equipment for your overnight stay.   Contacts, glasses, dentures or partials may not be worn into surgery, please bring cases for these belongings   For patients admitted to the hospital, discharge time will be determined by your treatment team.   Patients discharged the day of surgery will not be allowed to drive home, and someone needs to stay with them for 24 hours.    Special instructions:   [place]- Preparing For Surgery  Before surgery, you can play an important role. Because skin is not sterile, your skin needs to be as free of germs as possible. You can reduce the number of germs on your skin by washing with CHG (chlorahexidine gluconate) Soap before surgery.  CHG is an antiseptic cleaner which kills germs and bonds with the skin to continue killing germs even after washing.    Oral Hygiene is also important to reduce your risk of infection.  Remember -  BRUSH YOUR TEETH THE MORNING OF SURGERY WITH YOUR REGULAR TOOTHPASTE  Please do not use if you have an allergy to CHG or antibacterial soaps. If your skin becomes reddened/irritated stop using the CHG.  Do not shave (including legs and underarms) for at least 48 hours prior to first CHG shower. It is OK to shave your face.  Please follow these instructions carefully.   1. Shower the NIGHT BEFORE SURGERY and the MORNING OF SURGERY  2. If you chose to wash your hair, wash your hair first as usual with your normal shampoo.  3. After you shampoo, rinse your hair and body thoroughly to remove the shampoo.  4. Wash Face and genitals (private parts) with your normal soap.   5.  Shower the NIGHT BEFORE SURGERY and the MORNING OF SURGERY with CHG Soap.   6. Use CHG Soap as you would any other liquid soap. You can apply CHG directly to the  skin and wash gently with a scrungie or a clean washcloth.   7. Apply the CHG Soap to your body ONLY FROM THE NECK DOWN.  Do not use on open wounds or open sores. Avoid contact with your eyes, ears, mouth and genitals (private parts). Wash Face and genitals (private parts)  with your normal soap.   8. Wash thoroughly, paying special attention to the area where your surgery will be performed.  9. Thoroughly rinse your body with warm water from the neck down.  10. DO NOT shower/wash with your normal soap after using and rinsing off the CHG Soap.  11. Pat yourself dry with a CLEAN TOWEL.  12. Wear CLEAN PAJAMAS to bed the night before surgery  13. Place CLEAN SHEETS on your bed the night before your surgery  14. DO NOT SLEEP WITH PETS.   Day of Surgery: Take a shower with CHG soap. Wear Clean/Comfortable clothing the morning of surgery Do not apply any deodorants/lotions.   Remember to brush your teeth WITH YOUR REGULAR TOOTHPASTE.   Please read over the following fact sheets that you were given.

## 2020-11-01 NOTE — Pre-Procedure Instructions (Signed)
Surgical Instructions    Your procedure is scheduled on Wednesday April 27th.   Report to [facility] Main Entrance "A" at 0530 A.M., then check in with the Admitting office.  Call this number if you have problems the morning of surgery:  [phone]   If you have any questions prior to your surgery date call [phone]: Open Monday-Friday 8am-4pm    Remember:  Do not eat after midnight the night before your surgery  You may drink clear liquids until 0430 the morning of your surgery.   Clear liquids allowed are: Water, Non-Citrus Juices (without pulp), Carbonated Beverages, Clear Tea, Black Coffee Only, and Gatorade  Patient Instructions  . The night before surgery:  o No food after midnight. ONLY clear liquids after midnight    . The day of surgery (if you have diabetes): o Drink ONE (1) 10 oz water bottle given to you in your pre admission testing appointment by 0430 AM the morning of surgery. Drink in one sitting. Do not sip.  o This drink was given to you during your hospital  pre-op appointment visit.  o Nothing else to drink after completing the  10 oz bottle of water.          If you have questions, please contact your surgeon's office.     Take these medicines the morning of surgery with A SIP OF WATER   atorvastatin (LIPITOR)  colestipol (COLESTID)  diphenhydrAMINE (BENADRYL)  sertraline (ZOLOFT)     Take these medicines if needed:  ALPRAZolam Duanne Moron)  propranolol (INDERAL)  rizatriptan (MAXALT)    traMADol (ULTRAM)       As of today, STOP taking any Aspirin (unless otherwise instructed by your surgeon) Aleve, Naproxen, Ibuprofen, Motrin, Advil, Goody's, BC's, all herbal medications, fish oil, and all vitamins.                     Do not wear jewelry, make up, or nail polish            Do not wear lotions, powders, perfumes, or deodorant.            Do not shave 48 hours prior to surgery.              Do not bring valuables to the hospital.             [facility] is not responsible for any belongings or valuables.  Do NOT Smoke (Tobacco/Vaping) or drink Alcohol 24 hours prior to your procedure If you use a CPAP at night, you may bring all equipment for your overnight stay.   Contacts, glasses, dentures or partials may not be worn into surgery, please bring cases for these belongings   For patients admitted to the hospital, discharge time will be determined by your treatment team.   Patients discharged the day of surgery will not be allowed to drive home, and someone needs to stay with them for 24 hours.    Special instructions:   [place]- Preparing For Surgery  Before surgery, you can play an important role. Because skin is not sterile, your skin needs to be as free of germs as possible. You can reduce the number of germs on your skin by washing with CHG (chlorahexidine gluconate) Soap before surgery.  CHG is an antiseptic cleaner which kills germs and bonds with the skin to continue killing germs even after washing.    Oral Hygiene is also important to reduce your risk of infection.  Remember - BRUSH YOUR TEETH THE MORNING OF SURGERY WITH YOUR REGULAR TOOTHPASTE  Please do not use if you have an allergy to CHG or antibacterial soaps. If your skin becomes reddened/irritated stop using the CHG.  Do not shave (including legs and underarms) for at least 48 hours prior to first CHG shower. It is OK to shave your face.  Please follow these instructions carefully.   1. Shower the NIGHT BEFORE SURGERY and the MORNING OF SURGERY  2. If you chose to wash your hair, wash your hair first as usual with your normal shampoo.  3. After you shampoo, rinse your hair and body thoroughly to remove the shampoo.  4. Wash Face and genitals (private parts) with your normal soap.   5.  Shower the NIGHT BEFORE SURGERY and the MORNING OF SURGERY with CHG Soap.   6. Use CHG Soap as you would any other liquid soap. You can apply CHG directly to the  skin and wash gently with a scrungie or a clean washcloth.   7. Apply the CHG Soap to your body ONLY FROM THE NECK DOWN.  Do not use on open wounds or open sores. Avoid contact with your eyes, ears, mouth and genitals (private parts). Wash Face and genitals (private parts)  with your normal soap.   8. Wash thoroughly, paying special attention to the area where your surgery will be performed.  9. Thoroughly rinse your body with warm water from the neck down.  10. DO NOT shower/wash with your normal soap after using and rinsing off the CHG Soap.  11. Pat yourself dry with a CLEAN TOWEL.  12. Wear CLEAN PAJAMAS to bed the night before surgery  13. Place CLEAN SHEETS on your bed the night before your surgery  14. DO NOT SLEEP WITH PETS.   Day of Surgery: Take a shower with CHG soap. Wear Clean/Comfortable clothing the morning of surgery Do not apply any deodorants/lotions.   Remember to brush your teeth WITH YOUR REGULAR TOOTHPASTE.   Please read over the following fact sheets that you were given.

## 2020-11-04 ENCOUNTER — Encounter (HOSPITAL_COMMUNITY): Payer: Self-pay

## 2020-11-04 ENCOUNTER — Other Ambulatory Visit: Payer: Self-pay

## 2020-11-04 ENCOUNTER — Encounter (HOSPITAL_COMMUNITY)
Admission: RE | Admit: 2020-11-04 | Discharge: 2020-11-04 | Disposition: A | Discharge status: Home / Self Care | Payer: Medicare Other | Source: Ambulatory Visit | Attending: Orthopedic Surgery | Admitting: Orthopedic Surgery

## 2020-11-04 DIAGNOSIS — Z20822 Contact with and (suspected) exposure to covid-19: Secondary | ICD-10-CM | POA: Insufficient documentation

## 2020-11-04 DIAGNOSIS — Z01812 Encounter for preprocedural laboratory examination: Secondary | ICD-10-CM | POA: Diagnosis not present

## 2020-11-04 DIAGNOSIS — M4807 Spinal stenosis, lumbosacral region: Secondary | ICD-10-CM | POA: Diagnosis not present

## 2020-11-04 HISTORY — DX: Nausea with vomiting, unspecified: R11.2

## 2020-11-04 HISTORY — DX: Other specified postprocedural states: Z98.890

## 2020-11-04 HISTORY — DX: Anxiety disorder, unspecified: F41.9

## 2020-11-04 HISTORY — DX: Cardiac arrhythmia, unspecified: I49.9

## 2020-11-04 LAB — CBC WITH DIFFERENTIAL/PLATELET
Abs Immature Granulocytes: 0.01 10*3/uL (ref 0.00–0.07)
Basophils Absolute: 0.1 10*3/uL (ref 0.0–0.1)
Basophils Relative: 1 %
Eosinophils Absolute: 0.2 10*3/uL (ref 0.0–0.5)
Eosinophils Relative: 3 %
HCT: 43.5 % (ref 36.0–46.0)
Hemoglobin: 14.6 g/dL (ref 12.0–15.0)
Immature Granulocytes: 0 %
Lymphocytes Relative: 30 %
Lymphs Abs: 2 10*3/uL (ref 0.7–4.0)
MCH: 31.4 pg (ref 26.0–34.0)
MCHC: 33.6 g/dL (ref 30.0–36.0)
MCV: 93.5 fL (ref 80.0–100.0)
Monocytes Absolute: 0.5 10*3/uL (ref 0.1–1.0)
Monocytes Relative: 8 %
Neutro Abs: 3.9 10*3/uL (ref 1.7–7.7)
Neutrophils Relative %: 58 %
Platelets: 230 10*3/uL (ref 150–400)
RBC: 4.65 MIL/uL (ref 3.87–5.11)
RDW: 12.5 % (ref 11.5–15.5)
WBC: 6.7 10*3/uL (ref 4.0–10.5)
nRBC: 0 % (ref 0.0–0.2)

## 2020-11-04 LAB — TYPE AND SCREEN
ABO/RH(D): O POS
Antibody Screen: NEGATIVE

## 2020-11-04 LAB — COMPREHENSIVE METABOLIC PANEL
ALT: 31 U/L (ref 0–44)
AST: 37 U/L (ref 15–41)
Albumin: 3.9 g/dL (ref 3.5–5.0)
Alkaline Phosphatase: 60 U/L (ref 38–126)
Anion gap: 11 (ref 5–15)
BUN: 14 mg/dL (ref 8–23)
CO2: 25 mmol/L (ref 22–32)
Calcium: 9.2 mg/dL (ref 8.9–10.3)
Chloride: 102 mmol/L (ref 98–111)
Creatinine, Ser: 0.91 mg/dL (ref 0.44–1.00)
GFR, Estimated: >60 mL/min (ref >60)
Glucose, Bld: 83 mg/dL (ref 70–99)
Potassium: 3.9 mmol/L (ref 3.5–5.1)
Sodium: 138 mmol/L (ref 135–145)
Total Bilirubin: 0.4 mg/dL (ref 0.3–1.2)
Total Protein: 6.8 g/dL (ref 6.5–8.1)

## 2020-11-04 LAB — URINALYSIS, ROUTINE W REFLEX MICROSCOPIC
Bilirubin Urine: NEGATIVE
Glucose, UA: NEGATIVE mg/dL
Hgb urine dipstick: NEGATIVE
Ketones, ur: NEGATIVE mg/dL
Leukocytes,Ua: NEGATIVE
Nitrite: NEGATIVE
Protein, ur: NEGATIVE mg/dL
Specific Gravity, Urine: 1.02 (ref 1.005–1.030)
pH: 6 (ref 5.0–8.0)

## 2020-11-04 LAB — GLUCOSE, CAPILLARY: Glucose-Capillary: 127 mg/dL — ABNORMAL HIGH (ref 70–99)

## 2020-11-04 LAB — APTT: aPTT: 31 seconds (ref 24–36)

## 2020-11-04 LAB — SARS CORONAVIRUS 2 (TAT 6-24 HRS): SARS Coronavirus 2: NEGATIVE

## 2020-11-04 LAB — PROTIME-INR
INR: 1 (ref 0.8–1.2)
Prothrombin Time: 13.5 seconds (ref 11.4–15.2)

## 2020-11-04 LAB — SURGICAL PCR SCREEN
MRSA, PCR: NEGATIVE
Staphylococcus aureus: NEGATIVE

## 2020-11-04 NOTE — Progress Notes (Signed)
PCP - Shirline Frees Cardiologist - Mertie Moores  PPM/ICD - denies   Chest x-ray - na EKG - 10/30/20 Stress Test -  ECHO - denies Cardiac Cath - denies  Sleep Study -ddenies  CPAP - n/a  Fasting Blood Sugar - 100 Checks Blood Sugar every other week_  Blood Thinner Instructions:n/a Aspirin Instructions:per pre op instructions  ERAS Protcol -water PRE-SURGERYwater   COVID TEST-4/25/20223    Anesthesia review:yes hx SVT;Rt BBB on EKG 10/30/2020   Patient denies shortness of breath, fever, cough and chest pain at PAT appointment   All instructions explained to the patient, with a verbal understanding of the material. Patient agrees to go over the instructions while at home for a better understanding. Patient also instructed to self quarantine after being tested for COVID-19. The opportunity to ask questions was provided.

## 2020-11-05 MED ORDER — VANCOMYCIN HCL 1500 MG/300ML IV SOLN
1500.0000 mg | INTRAVENOUS | Status: AC
  Administered 2020-11-06: 1500 mg via INTRAVENOUS
  Filled 2020-11-05: qty 300

## 2020-11-05 NOTE — Progress Notes (Signed)
Anesthesia Chart Review:  Follows with cardiology for hx of SVT, HTN, HLD. Seen for preop cardiac eval 10/30/20 by Dr. Acie Fredrickson. Per note, "She is here partly because of preop evaluation prior to back surgery.  She is not had any episodes of chest pain, shortness of breath or supraventricular tachycardia.  She is at low risk for her upcoming back surgery."  Preop labs reviewed, WNL.  EKG 10/30/20: NSR. Rate 78. RBBB   [name] [facility] Short Stay Center/Anesthesiology Phone [phone] 11/05/2020 9:55 AM

## 2020-11-05 NOTE — Anesthesia Preprocedure Evaluation (Addendum)
Anesthesia Evaluation  Patient identified by MRN, date of birth, ID band Patient awake    Reviewed: Allergy & Precautions, NPO status , Patient's Chart, lab work & pertinent test results  History of Anesthesia Complications (+) PONV and history of anesthetic complications  Airway Mallampati: III  TM Distance: <3 FB Neck ROM: Full    Dental no notable dental hx. (+) Teeth Intact, Dental Advisory Given   Pulmonary neg pulmonary ROS,    Pulmonary exam normal        Cardiovascular hypertension, Pt. on home beta blockers + dysrhythmias Supra Ventricular Tachycardia  Rhythm:Regular Rate:Normal     Neuro/Psych  Headaches, PSYCHIATRIC DISORDERS Anxiety Depression    GI/Hepatic Neg liver ROS, PUD,   Endo/Other  diabetes  Renal/GU negative Renal ROS     Musculoskeletal  (+) Arthritis ,   Abdominal Normal abdominal exam  (+)   Peds  Hematology negative hematology ROS (+)   Anesthesia Other Findings - Ulcerative colitis  Reproductive/Obstetrics                           Anesthesia Physical Anesthesia Plan  ASA: III  Anesthesia Plan: General   Post-op Pain Management:    Induction: Intravenous  PONV Risk Score and Plan: 4 or greater and Ondansetron, Dexamethasone, Midazolam and Scopolamine patch - Pre-op  Airway Management Planned: Oral ETT  Additional Equipment: None  Intra-op Plan:   Post-operative Plan: Extubation in OR  Informed Consent: I have reviewed the patients History and Physical, chart, labs and discussed the procedure including the risks, benefits and alternatives for the proposed anesthesia with the patient or authorized representative who has indicated his/her understanding and acceptance.     Dental advisory given  Plan Discussed with: CRNA  Anesthesia Plan Comments: (PAT note by Karoline Caldwell, PA-C: Follows with cardiology for hx of SVT, HTN, HLD. Seen for preop  cardiac eval 10/30/20 by Dr. Acie Fredrickson. Per note, "She is here partly because of preop evaluation prior to back surgery.  She is not had any episodes of chest pain, shortness of breath or supraventricular tachycardia.  She is at low risk for her upcoming back surgery."  Preop labs reviewed, WNL.  EKG 10/30/20: NSR. Rate 78. RBBB )     Anesthesia Quick Evaluation

## 2020-11-06 ENCOUNTER — Other Ambulatory Visit: Payer: Self-pay

## 2020-11-06 ENCOUNTER — Inpatient Hospital Stay (HOSPITAL_COMMUNITY): Payer: Medicare Other

## 2020-11-06 ENCOUNTER — Inpatient Hospital Stay (HOSPITAL_COMMUNITY): Payer: Medicare Other | Admitting: Physician Assistant

## 2020-11-06 ENCOUNTER — Encounter (HOSPITAL_COMMUNITY): Payer: Self-pay | Admitting: Orthopedic Surgery

## 2020-11-06 ENCOUNTER — Ambulatory Visit (HOSPITAL_COMMUNITY)
Admission: RE | Disposition: A | Discharge status: Home / Self Care | Payer: Self-pay | Source: Home / Self Care | Attending: Orthopedic Surgery

## 2020-11-06 ENCOUNTER — Inpatient Hospital Stay (HOSPITAL_COMMUNITY): Payer: Medicare Other | Admitting: Certified Registered"

## 2020-11-06 ENCOUNTER — Observation Stay (HOSPITAL_COMMUNITY)
Admission: RE | Admit: 2020-11-06 | Discharge: 2020-11-07 | Disposition: A | Discharge status: Home / Self Care | Payer: Medicare Other | Attending: Orthopedic Surgery | Admitting: Orthopedic Surgery

## 2020-11-06 DIAGNOSIS — M5410 Radiculopathy, site unspecified: Secondary | ICD-10-CM | POA: Diagnosis present

## 2020-11-06 DIAGNOSIS — Z79899 Other long term (current) drug therapy: Secondary | ICD-10-CM | POA: Diagnosis not present

## 2020-11-06 DIAGNOSIS — M5416 Radiculopathy, lumbar region: Principal | ICD-10-CM | POA: Insufficient documentation

## 2020-11-06 DIAGNOSIS — E119 Type 2 diabetes mellitus without complications: Secondary | ICD-10-CM | POA: Diagnosis not present

## 2020-11-06 DIAGNOSIS — Z981 Arthrodesis status: Secondary | ICD-10-CM | POA: Diagnosis not present

## 2020-11-06 DIAGNOSIS — I10 Essential (primary) hypertension: Secondary | ICD-10-CM | POA: Diagnosis not present

## 2020-11-06 DIAGNOSIS — Z96651 Presence of right artificial knee joint: Secondary | ICD-10-CM | POA: Insufficient documentation

## 2020-11-06 DIAGNOSIS — M47816 Spondylosis without myelopathy or radiculopathy, lumbar region: Secondary | ICD-10-CM | POA: Diagnosis not present

## 2020-11-06 DIAGNOSIS — M48061 Spinal stenosis, lumbar region without neurogenic claudication: Secondary | ICD-10-CM | POA: Insufficient documentation

## 2020-11-06 DIAGNOSIS — M5136 Other intervertebral disc degeneration, lumbar region: Secondary | ICD-10-CM | POA: Diagnosis not present

## 2020-11-06 DIAGNOSIS — I471 Supraventricular tachycardia: Secondary | ICD-10-CM | POA: Diagnosis not present

## 2020-11-06 DIAGNOSIS — Z419 Encounter for procedure for purposes other than remedying health state, unspecified: Secondary | ICD-10-CM

## 2020-11-06 DIAGNOSIS — M4326 Fusion of spine, lumbar region: Secondary | ICD-10-CM | POA: Diagnosis not present

## 2020-11-06 DIAGNOSIS — M4316 Spondylolisthesis, lumbar region: Secondary | ICD-10-CM | POA: Insufficient documentation

## 2020-11-06 HISTORY — PX: TRANSFORAMINAL LUMBAR INTERBODY FUSION (TLIF) WITH PEDICLE SCREW FIXATION 2 LEVEL: SHX6142

## 2020-11-06 LAB — GLUCOSE, CAPILLARY
Glucose-Capillary: 107 mg/dL — ABNORMAL HIGH (ref 70–99)
Glucose-Capillary: 148 mg/dL — ABNORMAL HIGH (ref 70–99)
Glucose-Capillary: 181 mg/dL — ABNORMAL HIGH (ref 70–99)

## 2020-11-06 SURGERY — TRANSFORAMINAL LUMBAR INTERBODY FUSION (TLIF) WITH PEDICLE SCREW FIXATION 2 LEVEL
Anesthesia: General | Laterality: Right

## 2020-11-06 MED ORDER — HYDROMORPHONE HCL 1 MG/ML IJ SOLN
0.2500 mg | INTRAMUSCULAR | Status: DC | PRN
  Administered 2020-11-06 (×3): 0.5 mg via INTRAVENOUS

## 2020-11-06 MED ORDER — 0.9 % SODIUM CHLORIDE (POUR BTL) OPTIME
TOPICAL | Status: DC | PRN
  Administered 2020-11-06 (×3): 1000 mL

## 2020-11-06 MED ORDER — LIDOCAINE 2% (20 MG/ML) 5 ML SYRINGE
INTRAMUSCULAR | Status: AC
  Filled 2020-11-06: qty 5

## 2020-11-06 MED ORDER — ALUM & MAG HYDROXIDE-SIMETH 200-200-20 MG/5ML PO SUSP: 30.0000 mL | Freq: Four times a day (QID) | ORAL | Status: DC | PRN

## 2020-11-06 MED ORDER — EPHEDRINE 5 MG/ML INJ
INTRAVENOUS | Status: AC
  Filled 2020-11-06: qty 10

## 2020-11-06 MED ORDER — SODIUM CHLORIDE 0.9% FLUSH: 3.0000 mL | Freq: Two times a day (BID) | INTRAVENOUS | Status: DC

## 2020-11-06 MED ORDER — MORPHINE SULFATE (PF) 2 MG/ML IV SOLN
1.0000 mg | INTRAVENOUS | Status: DC | PRN
  Administered 2020-11-06: 2 mg via INTRAVENOUS
  Filled 2020-11-06: qty 1

## 2020-11-06 MED ORDER — ROCURONIUM BROMIDE 10 MG/ML (PF) SYRINGE
PREFILLED_SYRINGE | INTRAVENOUS | Status: AC
  Filled 2020-11-06: qty 10

## 2020-11-06 MED ORDER — METOPROLOL SUCCINATE ER 50 MG PO TB24
50.0000 mg | ORAL_TABLET | Freq: Every day | ORAL | Status: DC
  Filled 2020-11-06: qty 1

## 2020-11-06 MED ORDER — MEPERIDINE HCL 25 MG/ML IJ SOLN: 6.2500 mg | INTRAMUSCULAR | Status: DC | PRN

## 2020-11-06 MED ORDER — BUPIVACAINE-EPINEPHRINE (PF) 0.25% -1:200000 IJ SOLN
INTRAMUSCULAR | Status: AC
  Filled 2020-11-06: qty 30

## 2020-11-06 MED ORDER — SERTRALINE HCL 50 MG PO TABS
200.0000 mg | ORAL_TABLET | Freq: Every day | ORAL | Status: DC
  Administered 2020-11-07: 200 mg via ORAL
  Filled 2020-11-06: qty 4

## 2020-11-06 MED ORDER — AMISULPRIDE (ANTIEMETIC) 5 MG/2ML IV SOLN: 10.0000 mg | Freq: Once | INTRAVENOUS | Status: DC | PRN

## 2020-11-06 MED ORDER — DEXAMETHASONE SODIUM PHOSPHATE 10 MG/ML IJ SOLN
INTRAMUSCULAR | Status: AC
  Filled 2020-11-06: qty 1

## 2020-11-06 MED ORDER — FENTANYL CITRATE (PF) 250 MCG/5ML IJ SOLN
INTRAMUSCULAR | Status: DC | PRN
  Administered 2020-11-06: 50 ug via INTRAVENOUS
  Administered 2020-11-06: 100 ug via INTRAVENOUS
  Administered 2020-11-06: 50 ug via INTRAVENOUS

## 2020-11-06 MED ORDER — MIDAZOLAM HCL 2 MG/2ML IJ SOLN
INTRAMUSCULAR | Status: AC
  Filled 2020-11-06: qty 2

## 2020-11-06 MED ORDER — ONDANSETRON HCL 4 MG PO TABS: 4.0000 mg | ORAL_TABLET | Freq: Four times a day (QID) | ORAL | Status: DC | PRN

## 2020-11-06 MED ORDER — MENTHOL 3 MG MT LOZG: 1.0000 | LOZENGE | OROMUCOSAL | Status: DC | PRN

## 2020-11-06 MED ORDER — ALBUMIN HUMAN 5 % IV SOLN: INTRAVENOUS | Status: DC | PRN

## 2020-11-06 MED ORDER — COLESTIPOL HCL 1 G PO TABS
2.0000 g | ORAL_TABLET | Freq: Two times a day (BID) | ORAL | Status: DC
  Administered 2020-11-06 – 2020-11-07 (×2): 2 g via ORAL
  Filled 2020-11-06 (×3): qty 2

## 2020-11-06 MED ORDER — LACTATED RINGERS IV SOLN: INTRAVENOUS | Status: DC

## 2020-11-06 MED ORDER — ROCURONIUM BROMIDE 10 MG/ML (PF) SYRINGE
PREFILLED_SYRINGE | INTRAVENOUS | Status: DC | PRN
  Administered 2020-11-06: 50 mg via INTRAVENOUS
  Administered 2020-11-06: 10 mg via INTRAVENOUS
  Administered 2020-11-06: 50 mg via INTRAVENOUS

## 2020-11-06 MED ORDER — ACETAMINOPHEN 10 MG/ML IV SOLN
1000.0000 mg | Freq: Once | INTRAVENOUS | Status: DC | PRN
  Administered 2020-11-06: 1000 mg via INTRAVENOUS

## 2020-11-06 MED ORDER — GLYCOPYRROLATE PF 0.2 MG/ML IJ SOSY
PREFILLED_SYRINGE | INTRAMUSCULAR | Status: DC | PRN
  Administered 2020-11-06: .2 mg via INTRAVENOUS

## 2020-11-06 MED ORDER — ACETAMINOPHEN 325 MG PO TABS: 325.0000 mg | ORAL_TABLET | Freq: Once | ORAL | Status: DC | PRN

## 2020-11-06 MED ORDER — PROPRANOLOL HCL 10 MG PO TABS: 10.0000 mg | ORAL_TABLET | Freq: Every day | ORAL | Status: DC | PRN

## 2020-11-06 MED ORDER — ONDANSETRON HCL 4 MG/2ML IJ SOLN
INTRAMUSCULAR | Status: DC | PRN
  Administered 2020-11-06: 4 mg via INTRAVENOUS

## 2020-11-06 MED ORDER — EPHEDRINE SULFATE-NACL 50-0.9 MG/10ML-% IV SOSY
PREFILLED_SYRINGE | INTRAVENOUS | Status: DC | PRN
  Administered 2020-11-06 (×5): 10 mg via INTRAVENOUS

## 2020-11-06 MED ORDER — THROMBIN (RECOMBINANT) 20000 UNITS EX SOLR
CUTANEOUS | Status: AC
  Filled 2020-11-06: qty 20000

## 2020-11-06 MED ORDER — HYDROMORPHONE HCL 1 MG/ML IJ SOLN
INTRAMUSCULAR | Status: AC
  Filled 2020-11-06: qty 1

## 2020-11-06 MED ORDER — SUMATRIPTAN SUCCINATE 50 MG PO TABS: 50.0000 mg | ORAL_TABLET | ORAL | Status: DC | PRN

## 2020-11-06 MED ORDER — ATORVASTATIN CALCIUM 10 MG PO TABS
20.0000 mg | ORAL_TABLET | Freq: Every day | ORAL | Status: DC
  Administered 2020-11-07: 20 mg via ORAL
  Filled 2020-11-06: qty 2

## 2020-11-06 MED ORDER — SUCCINYLCHOLINE CHLORIDE 200 MG/10ML IV SOSY
PREFILLED_SYRINGE | INTRAVENOUS | Status: DC | PRN
  Administered 2020-11-06: 180 mg via INTRAVENOUS

## 2020-11-06 MED ORDER — HYDROCODONE-ACETAMINOPHEN 5-325 MG PO TABS
2.0000 | ORAL_TABLET | ORAL | Status: DC | PRN
  Administered 2020-11-06 – 2020-11-07 (×6): 2 via ORAL
  Filled 2020-11-06 (×6): qty 2

## 2020-11-06 MED ORDER — LACTATED RINGERS IV SOLN: INTRAVENOUS | Status: DC | PRN

## 2020-11-06 MED ORDER — LIDOCAINE 2% (20 MG/ML) 5 ML SYRINGE
INTRAMUSCULAR | Status: DC | PRN
  Administered 2020-11-06: 40 mg via INTRAVENOUS

## 2020-11-06 MED ORDER — BUPIVACAINE-EPINEPHRINE 0.25% -1:200000 IJ SOLN
INTRAMUSCULAR | Status: DC | PRN
  Administered 2020-11-06: 20 mL
  Administered 2020-11-06: 10 mL

## 2020-11-06 MED ORDER — PROPOFOL 10 MG/ML IV BOLUS
INTRAVENOUS | Status: DC | PRN
  Administered 2020-11-06: 150 mg via INTRAVENOUS

## 2020-11-06 MED ORDER — METHOCARBAMOL 500 MG PO TABS
500.0000 mg | ORAL_TABLET | Freq: Four times a day (QID) | ORAL | Status: DC | PRN
  Administered 2020-11-06 – 2020-11-07 (×4): 500 mg via ORAL
  Filled 2020-11-06 (×3): qty 1

## 2020-11-06 MED ORDER — MIDAZOLAM HCL 2 MG/2ML IJ SOLN
INTRAMUSCULAR | Status: DC | PRN
  Administered 2020-11-06: 2 mg via INTRAVENOUS

## 2020-11-06 MED ORDER — METHYLENE BLUE 0.5 % INJ SOLN
INTRAVENOUS | Status: AC
  Filled 2020-11-06: qty 10

## 2020-11-06 MED ORDER — HYDROCODONE-ACETAMINOPHEN 5-325 MG PO TABS: 1.0000 | ORAL_TABLET | ORAL | Status: DC | PRN

## 2020-11-06 MED ORDER — SUGAMMADEX SODIUM 200 MG/2ML IV SOLN
INTRAVENOUS | Status: DC | PRN
  Administered 2020-11-06: 200 mg via INTRAVENOUS

## 2020-11-06 MED ORDER — PROPOFOL 10 MG/ML IV BOLUS
INTRAVENOUS | Status: AC
  Filled 2020-11-06: qty 20

## 2020-11-06 MED ORDER — VANCOMYCIN HCL 1250 MG/250ML IV SOLN
1250.0000 mg | INTRAVENOUS | Status: DC
  Administered 2020-11-06: 1250 mg via INTRAVENOUS
  Filled 2020-11-06 (×2): qty 250

## 2020-11-06 MED ORDER — POTASSIUM CHLORIDE IN NACL 20-0.9 MEQ/L-% IV SOLN: INTRAVENOUS | Status: DC

## 2020-11-06 MED ORDER — SUCCINYLCHOLINE CHLORIDE 200 MG/10ML IV SOSY
PREFILLED_SYRINGE | INTRAVENOUS | Status: AC
  Filled 2020-11-06: qty 10

## 2020-11-06 MED ORDER — ACETAMINOPHEN 650 MG RE SUPP: 650.0000 mg | RECTAL | Status: DC | PRN

## 2020-11-06 MED ORDER — ACETAMINOPHEN 160 MG/5ML PO SOLN
325.0000 mg | Freq: Once | ORAL | Status: DC | PRN
Start: 2020-11-06 — End: 2020-11-06

## 2020-11-06 MED ORDER — SODIUM CHLORIDE 0.9 % IV SOLN: 250.0000 mL | INTRAVENOUS | Status: DC

## 2020-11-06 MED ORDER — SENNOSIDES-DOCUSATE SODIUM 8.6-50 MG PO TABS: 1.0000 | ORAL_TABLET | Freq: Every evening | ORAL | Status: DC | PRN

## 2020-11-06 MED ORDER — PHENOL 1.4 % MT LIQD: 1.0000 | OROMUCOSAL | Status: DC | PRN

## 2020-11-06 MED ORDER — ONDANSETRON HCL 4 MG/2ML IJ SOLN
INTRAMUSCULAR | Status: AC
  Filled 2020-11-06: qty 2

## 2020-11-06 MED ORDER — METHOCARBAMOL 1000 MG/10ML IJ SOLN
500.0000 mg | Freq: Four times a day (QID) | INTRAMUSCULAR | Status: DC | PRN
  Filled 2020-11-06: qty 5

## 2020-11-06 MED ORDER — FLEET ENEMA 7-19 GM/118ML RE ENEM: 1.0000 | ENEMA | Freq: Once | RECTAL | Status: DC | PRN

## 2020-11-06 MED ORDER — THROMBIN 20000 UNITS EX SOLR
CUTANEOUS | Status: DC | PRN
  Administered 2020-11-06: 20000 [IU] via TOPICAL

## 2020-11-06 MED ORDER — ZOLPIDEM TARTRATE 5 MG PO TABS
5.0000 mg | ORAL_TABLET | Freq: Every evening | ORAL | Status: DC | PRN
  Administered 2020-11-06: 5 mg via ORAL

## 2020-11-06 MED ORDER — DOCUSATE SODIUM 100 MG PO CAPS
100.0000 mg | ORAL_CAPSULE | Freq: Two times a day (BID) | ORAL | Status: DC
  Administered 2020-11-07: 100 mg via ORAL
  Filled 2020-11-06 (×2): qty 1

## 2020-11-06 MED ORDER — ALPRAZOLAM 0.5 MG PO TABS: 0.5000 mg | ORAL_TABLET | Freq: Every evening | ORAL | Status: DC | PRN

## 2020-11-06 MED ORDER — ONDANSETRON HCL 4 MG/2ML IJ SOLN: 4.0000 mg | Freq: Four times a day (QID) | INTRAMUSCULAR | Status: DC | PRN

## 2020-11-06 MED ORDER — BUPIVACAINE LIPOSOME 1.3 % IJ SUSP
INTRAMUSCULAR | Status: DC | PRN
  Administered 2020-11-06: 20 mL

## 2020-11-06 MED ORDER — METHOCARBAMOL 500 MG PO TABS
ORAL_TABLET | ORAL | Status: AC
  Filled 2020-11-06: qty 1

## 2020-11-06 MED ORDER — SODIUM CHLORIDE 0.9% FLUSH: 3.0000 mL | INTRAVENOUS | Status: DC | PRN

## 2020-11-06 MED ORDER — PHENYLEPHRINE HCL-NACL 10-0.9 MG/250ML-% IV SOLN
INTRAVENOUS | Status: DC | PRN
  Administered 2020-11-06: 80 ug/min via INTRAVENOUS

## 2020-11-06 MED ORDER — SCOPOLAMINE 1 MG/3DAYS TD PT72
MEDICATED_PATCH | TRANSDERMAL | Status: DC | PRN
  Administered 2020-11-06: 1 via TRANSDERMAL

## 2020-11-06 MED ORDER — FENTANYL CITRATE (PF) 250 MCG/5ML IJ SOLN
INTRAMUSCULAR | Status: AC
  Filled 2020-11-06: qty 5

## 2020-11-06 MED ORDER — DIPHENHYDRAMINE HCL 25 MG PO CAPS
25.0000 mg | ORAL_CAPSULE | Freq: Every day | ORAL | Status: DC
  Administered 2020-11-07: 25 mg via ORAL
  Filled 2020-11-06 (×2): qty 1

## 2020-11-06 MED ORDER — ACETAMINOPHEN 325 MG PO TABS: 650.0000 mg | ORAL_TABLET | ORAL | Status: DC | PRN

## 2020-11-06 MED ORDER — SCOPOLAMINE 1 MG/3DAYS TD PT72
MEDICATED_PATCH | TRANSDERMAL | Status: AC
  Filled 2020-11-06: qty 1

## 2020-11-06 MED ORDER — BUPIVACAINE LIPOSOME 1.3 % IJ SUSP
INTRAMUSCULAR | Status: AC
  Filled 2020-11-06: qty 20

## 2020-11-06 MED ORDER — ZOLPIDEM TARTRATE 5 MG PO TABS
5.0000 mg | ORAL_TABLET | Freq: Every day | ORAL | Status: DC
  Administered 2020-11-06: 5 mg via ORAL
  Filled 2020-11-06: qty 1

## 2020-11-06 MED ORDER — GLYCOPYRROLATE PF 0.2 MG/ML IJ SOSY
PREFILLED_SYRINGE | INTRAMUSCULAR | Status: AC
  Filled 2020-11-06: qty 1

## 2020-11-06 MED ORDER — BISACODYL 5 MG PO TBEC: 5.0000 mg | DELAYED_RELEASE_TABLET | Freq: Every day | ORAL | Status: DC | PRN

## 2020-11-06 MED ORDER — PROMETHAZINE HCL 25 MG/ML IJ SOLN: 6.2500 mg | INTRAMUSCULAR | Status: DC | PRN

## 2020-11-06 MED ORDER — ACETAMINOPHEN 10 MG/ML IV SOLN
INTRAVENOUS | Status: AC
  Filled 2020-11-06: qty 100

## 2020-11-06 MED ORDER — POVIDONE-IODINE 7.5 % EX SOLN: Freq: Once | CUTANEOUS | Status: DC

## 2020-11-06 SURGICAL SUPPLY — 84 items
APL SKNCLS STERI-STRIP NONHPOA (GAUZE/BANDAGES/DRESSINGS) ×1
BENZOIN TINCTURE PRP APPL 2/3 (GAUZE/BANDAGES/DRESSINGS) ×2 IMPLANT
BONE VIVIGEN FORMABLE 10CC (Bone Implant) ×2 IMPLANT
BUR PRESCISION 1.7 ELITE (BURR) ×2 IMPLANT
BUR ROUND FLUTED 5 RND (BURR) ×3 IMPLANT
CAGE CONCORDE BULLET 9X7X27 (Cage) ×1 IMPLANT
CAGE CONCORDE BULLET 9X9X27 (Cage) ×1 IMPLANT
CARTRIDGE OIL MAESTRO DRILL (MISCELLANEOUS) ×1 IMPLANT
CLSR STERI-STRIP ANTIMIC 1/2X4 (GAUZE/BANDAGES/DRESSINGS) ×1 IMPLANT
CNTNR URN SCR LID CUP LEK RST (MISCELLANEOUS) ×1 IMPLANT
CONT SPEC 4OZ STRL OR WHT (MISCELLANEOUS) ×2
COVER MAYO STAND STRL (DRAPES) ×4 IMPLANT
COVER SURGICAL LIGHT HANDLE (MISCELLANEOUS) ×2 IMPLANT
COVER WAND RF STERILE (DRAPES) ×2 IMPLANT
DIFFUSER DRILL AIR PNEUMATIC (MISCELLANEOUS) ×2 IMPLANT
DRAIN CHANNEL 15F RND FF W/TCR (WOUND CARE) ×1 IMPLANT
DRAPE C-ARM 42X72 X-RAY (DRAPES) ×2 IMPLANT
DRAPE C-ARMOR (DRAPES) ×1 IMPLANT
DRAPE POUCH INSTRU U-SHP 10X18 (DRAPES) ×2 IMPLANT
DRAPE SURG 17X23 STRL (DRAPES) ×8 IMPLANT
DURAPREP 26ML APPLICATOR (WOUND CARE) ×2 IMPLANT
ELECT BLADE 4.0 EZ CLEAN MEGAD (MISCELLANEOUS) ×2
ELECT CAUTERY BLADE 6.4 (BLADE) ×2 IMPLANT
ELECT REM PT RETURN 9FT ADLT (ELECTROSURGICAL) ×2
ELECTRODE BLDE 4.0 EZ CLN MEGD (MISCELLANEOUS) ×1 IMPLANT
ELECTRODE REM PT RTRN 9FT ADLT (ELECTROSURGICAL) ×1 IMPLANT
EVACUATOR SILICONE 100CC (DRAIN) ×1 IMPLANT
FILTER STRAW FLUID ASPIR (MISCELLANEOUS) ×2 IMPLANT
GAUZE 4X4 16PLY RFD (DISPOSABLE) ×2 IMPLANT
GAUZE SPONGE 4X4 12PLY STRL (GAUZE/BANDAGES/DRESSINGS) ×2 IMPLANT
GLOVE BIO SURGEON STRL SZ7 (GLOVE) ×2 IMPLANT
GLOVE BIO SURGEON STRL SZ8 (GLOVE) ×2 IMPLANT
GLOVE SRG 8 PF TXTR STRL LF DI (GLOVE) ×1 IMPLANT
GLOVE SURG UNDER POLY LF SZ7 (GLOVE) ×2 IMPLANT
GLOVE SURG UNDER POLY LF SZ8 (GLOVE) ×2
GOWN STRL REUS W/ TWL LRG LVL3 (GOWN DISPOSABLE) ×2 IMPLANT
GOWN STRL REUS W/ TWL XL LVL3 (GOWN DISPOSABLE) ×1 IMPLANT
GOWN STRL REUS W/TWL LRG LVL3 (GOWN DISPOSABLE) ×4
GOWN STRL REUS W/TWL XL LVL3 (GOWN DISPOSABLE) ×2
GRAFT BNE MATRIX VG FRMBL L 10 (Bone Implant) IMPLANT
IV CATH 14GX2 1/4 (CATHETERS) ×2 IMPLANT
KIT BASIN OR (CUSTOM PROCEDURE TRAY) ×2 IMPLANT
KIT POSITION SURG JACKSON T1 (MISCELLANEOUS) ×2 IMPLANT
KIT TURNOVER KIT B (KITS) ×2 IMPLANT
MARKER SKIN DUAL TIP RULER LAB (MISCELLANEOUS) ×4 IMPLANT
NDL 18GX1X1/2 (RX/OR ONLY) (NEEDLE) ×1 IMPLANT
NDL HYPO 25GX1X1/2 BEV (NEEDLE) ×1 IMPLANT
NDL SPNL 18GX3.5 QUINCKE PK (NEEDLE) ×2 IMPLANT
NEEDLE 18GX1X1/2 (RX/OR ONLY) (NEEDLE) ×2 IMPLANT
NEEDLE 22X1 1/2 (OR ONLY) (NEEDLE) ×4 IMPLANT
NEEDLE HYPO 25GX1X1/2 BEV (NEEDLE) ×2 IMPLANT
NEEDLE SPNL 18GX3.5 QUINCKE PK (NEEDLE) ×4 IMPLANT
NS IRRIG 1000ML POUR BTL (IV SOLUTION) ×2 IMPLANT
OIL CARTRIDGE MAESTRO DRILL (MISCELLANEOUS) ×2
PACK LAMINECTOMY ORTHO (CUSTOM PROCEDURE TRAY) ×2 IMPLANT
PACK UNIVERSAL I (CUSTOM PROCEDURE TRAY) ×2 IMPLANT
PAD ARMBOARD 7.5X6 YLW CONV (MISCELLANEOUS) ×4 IMPLANT
PATTIES SURGICAL .5 X1 (DISPOSABLE) ×2 IMPLANT
PATTIES SURGICAL .5X1.5 (GAUZE/BANDAGES/DRESSINGS) ×2 IMPLANT
ROD EXPEDIUM PER BENT 65MM (Rod) ×2 IMPLANT
SCREW SET SINGLE INNER (Screw) ×6 IMPLANT
SCREW VIPER 7MMX30MM CORTICAL (Screw) ×1 IMPLANT
SCREW VIPER CORT FIX 6.00X30 (Screw) ×6 IMPLANT
SPONGE INTESTINAL PEANUT (DISPOSABLE) ×2 IMPLANT
SPONGE SURGIFOAM ABS GEL 100 (HEMOSTASIS) ×2 IMPLANT
STRIP CLOSURE SKIN 1/2X4 (GAUZE/BANDAGES/DRESSINGS) ×4 IMPLANT
SUT ETHILON 2 0 FS 18 (SUTURE) ×1 IMPLANT
SUT MNCRL AB 4-0 PS2 18 (SUTURE) ×2 IMPLANT
SUT VIC AB 0 CT1 18XCR BRD 8 (SUTURE) ×1 IMPLANT
SUT VIC AB 0 CT1 8-18 (SUTURE) ×2
SUT VIC AB 1 CT1 18XCR BRD 8 (SUTURE) ×1 IMPLANT
SUT VIC AB 1 CT1 8-18 (SUTURE) ×2
SUT VIC AB 2-0 CT2 18 VCP726D (SUTURE) ×2 IMPLANT
SYR 20ML LL LF (SYRINGE) ×4 IMPLANT
SYR BULB IRRIG 60ML STRL (SYRINGE) ×2 IMPLANT
SYR CONTROL 10ML LL (SYRINGE) ×4 IMPLANT
SYR TB 1ML LUER SLIP (SYRINGE) ×2 IMPLANT
TAP EXPEDIUM DL 4.35 (INSTRUMENTS) ×1 IMPLANT
TAP EXPEDIUM DL 5.0 (INSTRUMENTS) ×1 IMPLANT
TAP EXPEDIUM DL 6.0 (INSTRUMENTS) ×1 IMPLANT
TAPE CLOTH SURG 4X10 WHT LF (GAUZE/BANDAGES/DRESSINGS) ×1 IMPLANT
TRAY FOLEY MTR SLVR 16FR STAT (SET/KITS/TRAYS/PACK) ×2 IMPLANT
WATER STERILE IRR 1000ML POUR (IV SOLUTION) ×2 IMPLANT
YANKAUER SUCT BULB TIP NO VENT (SUCTIONS) ×2 IMPLANT

## 2020-11-06 NOTE — Progress Notes (Addendum)
Pharmacy Antibiotic Note  [name] is a 70 y.o. female admitted on 11/06/2020 with severe spinal stenosis L3-4, L4-5. Pt is S/P lumbar decompression surgery this afternoon.  Pharmacy has been consulted for vancomycin dosing; pt has drain in place post op, per surgeon note.  WBC 6.7, afebrile; Scr 0.91, CrCl 71.7 ml/min  Pt rec'd vancomycin 1500 mg IV X 1 pre op at 0640 AM today  Plan: Vancomycin 1250 mg IV Q 24 hrs starting now (~1800) (estimated vancomycin AUC on this regimen, using Scr 0.91, is 481.4; goal vancomycin AUC is 400-550) Monitor WBC, temp, renal function  Temp (24hrs), Avg:98 F (36.7 C), Min:98 F (36.7 C), Max:98.1 F (36.7 C)  Recent Labs  Lab 11/04/20 1056  WBC 6.7  CREATININE 0.91    Estimated Creatinine Clearance: 71.7 mL/min (by C-G formula based on SCr of 0.91 mg/dL).    Allergies  Allergen Reactions  . Amoxicillin     REACTION: Hives  . Biaxin [Clarithromycin]     REACTION: Hives  . Crestor [Rosuvastatin Calcium]     ARM PAIN  . Doxycycline     REACTION: Hives  . Penicillins     REACTION: Hives  . Pravachol     ARM PAIN  . Topamax [Topiramate] Anxiety    Antimicrobials this admission: 4/27 vancomycin >>  Microbiology results: 4/25 MRSA PCR: negative 4/25 COVID: negative  Thank you for allowing pharmacy to be a part of this patient's care.  Gillermina Hu, PharmD, BCPS, [facility] Clinical Pharmacist 11/06/2020 5:14 PM

## 2020-11-06 NOTE — Anesthesia Postprocedure Evaluation (Signed)
Anesthesia Post Note  Patient: [name]  Procedure(s) Performed: RIGHT-SIDED LUMBAR 3-4, LUMBAR 4-5 TRANSFORAMINAL LUMBAR INTERBODY FUSION WITH INSTRUMENTATION AND ALLOGRAFT (Right )     Patient location during evaluation: PACU Anesthesia Type: General Level of consciousness: awake and alert Pain management: pain level controlled Vital Signs Assessment: post-procedure vital signs reviewed and stable Respiratory status: spontaneous breathing, nonlabored ventilation, respiratory function stable and patient connected to nasal cannula oxygen Cardiovascular status: blood pressure returned to baseline and stable Postop Assessment: no apparent nausea or vomiting Anesthetic complications: no   No complications documented.  Last Vitals:  Vitals:   11/06/20 1624 11/06/20 1656  BP: (!) 101/50 106/65  Pulse:  77  Resp:  16  Temp:  36.7 C  SpO2:  96%    Last Pain:  Vitals:   11/06/20 1656  TempSrc: Oral  PainSc:                  [name]

## 2020-11-06 NOTE — Anesthesia Procedure Notes (Signed)
Procedure Name: Intubation Date/Time: 11/06/2020 7:47 AM Performed by: Michele Rockers, CRNA Pre-anesthesia Checklist: Patient identified, Patient being monitored, Timeout performed, Emergency Drugs available and Suction available Patient Re-evaluated:Patient Re-evaluated prior to induction Oxygen Delivery Method: Circle System Utilized Preoxygenation: Pre-oxygenation with 100% oxygen Induction Type: IV induction Ventilation: Mask ventilation without difficulty Laryngoscope Size: Miller and 2 Grade View: Grade I Tube type: Oral Tube size: 7.5 mm Number of attempts: 1 Airway Equipment and Method: Stylet Placement Confirmation: ETT inserted through vocal cords under direct vision,  positive ETCO2 and breath sounds checked- equal and bilateral Secured at: 21 cm Tube secured with: Tape Dental Injury: Teeth and Oropharynx as per pre-operative assessment

## 2020-11-06 NOTE — H&P (Signed)
PREOPERATIVE H&P  Chief Complaint: Right leg pain  HPI: [name] is a 70 y.o. female who presents with ongoing pain in the right leg  MRI reveals spinal stenosis and instability at L3/4 and L4/5  Patient has failed multiple forms of conservative care and continues to have pain (see office notes for additional details regarding the patient's full course of treatment)  Past Medical History:  Diagnosis Date  . Anxiety   . Arthritis   . Depression   . DM (diabetes mellitus) ([place])    Diet controlled  . Dysrhythmia    SVT  . Goiter    neg biopsy  . Hyperlipidemia   . Hypertension   . Migraine headache   . PONV (postoperative nausea and vomiting)   . SVT (supraventricular tachycardia) ([place])   . Ulcerative colitis [facility])    Past Surgical History:  Procedure Laterality Date  . ABDOMINAL HYSTERECTOMY    . ACHILLES TENDON REPAIR    . APPENDECTOMY    . CARDIOVASCULAR STRESS TEST  11/05/2003   EF 68%  . CHOLECYSTECTOMY    . DOPPLER ECHOCARDIOGRAPHY  02/11/2000   EF 60-65%  . FOOT SURGERY    . JOINT REPLACEMENT    . NOSE SURGERY    . TONSILLECTOMY    . TOTAL KNEE ARTHROPLASTY     right knee   Social History   Socioeconomic History  . Marital status: Married    Spouse name: Not on file  . Number of children: 3  . Years of education: Not on file  . Highest education level: Not on file  Occupational History  . Occupation: Aeronautical engineer: [employer]  Tobacco Use  . Smoking status: Never Smoker  . Smokeless tobacco: Never Used  Vaping Use  . Vaping Use: Never used  Substance and Sexual Activity  . Alcohol use: Yes    Comment: Rare  . Drug use: No  . Sexual activity: Not on file  Other Topics Concern  . Not on file  Social History Narrative  . Not on file   Social Determinants of Health   Financial Resource Strain: Not on file  Food Insecurity: Not on file  Transportation Needs: Not on file  Physical Activity: Not on file   Stress: Not on file  Social Connections: Not on file   Family History  Problem Relation Age of Onset  . Myocarditis Mother   . Aneurysm Father   . Breast cancer Paternal Aunt    Allergies  Allergen Reactions  . Amoxicillin     REACTION: Hives  . Biaxin [Clarithromycin]     REACTION: Hives  . Crestor [Rosuvastatin Calcium]     ARM PAIN  . Doxycycline     REACTION: Hives  . Penicillins     REACTION: Hives  . Pravachol     ARM PAIN  . Topamax [Topiramate] Anxiety   Prior to Admission medications   Medication Sig Start Date End Date Taking? Authorizing Provider  ALPRAZolam Duanne Moron) 0.5 MG tablet Take 0.5 mg by mouth at bedtime as needed for anxiety or sleep.   Yes [provider]  atorvastatin (LIPITOR) 20 MG tablet Take 20 mg by mouth daily.   Yes [provider]  colestipol (COLESTID) 1 g tablet Take 2 g by mouth 2 (two) times daily.   Yes [provider]  diphenhydrAMINE (BENADRYL) 25 MG tablet Take 25 mg by mouth daily.   Yes [provider]  metoprolol (TOPROL-XL) 100 MG 24 hr tablet Take 50 mg by mouth at bedtime.   Yes [provider]  Multiple Vitamins-Minerals (MULTIVITAMIN WITH MINERALS) tablet Take 1 tablet by mouth daily.   Yes [provider]  Probiotic Product (PRO-BIOTIC BLEND PO) Take 1 capsule by mouth daily.    Yes [provider]  propranolol (INDERAL) 10 MG tablet Take 10 mg by mouth daily as needed. For svt episodes 03/27/11  Yes Nahser, Wonda Cheng, MD  rizatriptan (MAXALT) 10 MG tablet Take 10 mg by mouth daily as needed. May repeat in 2 hours if needed. For migraines   Yes [provider]  sertraline (ZOLOFT) 100 MG tablet Take 200 mg by mouth daily.   Yes [provider]  traMADol (ULTRAM) 50 MG tablet Take 50 mg by mouth 2 (two) times daily as needed for severe pain. 01/25/18  Yes [provider]  zolpidem (AMBIEN) 10 MG tablet Take 10 mg by mouth at bedtime.   Yes  [provider]     All other systems have been reviewed and were otherwise negative with the exception of those mentioned in the HPI and as above.  Physical Exam: There were no vitals filed for this visit.  There is no height or weight on file to calculate BMI.  General: Alert, no acute distress Cardiovascular: No pedal edema Respiratory: No cyanosis, no use of accessory musculature Skin: No lesions in the area of chief complaint Neurologic: Sensation intact distally Psychiatric: Patient is competent for consent with normal mood and affect Lymphatic: No axillary or cervical lymphadenopathy   Assessment/Plan: LUMBAR RADICULOPATHY AND SPINAL STENOSIS AND INSTABILITY AT L3/4 AND L4/5 Plan for Procedure(s): RIGHT-SIDED LUMBAR3- LUMBAR 4, LUMBAR 4- LUMBAR 5 TRANSFORAMINAL LUMBAR INTERBODY FUSION WITH INSTRUMENTATION AND ALLOGRAFT   Norva Karvonen, MD 11/06/2020 6:46 AM

## 2020-11-06 NOTE — Op Note (Signed)
PATIENT NAME: [name]   MEDICAL RECORD NO.:   [id number]    DATE OF BIRTH: Nov 14, 1950    DATE OF PROCEDURE: 11/06/2020                                 OPERATIVE REPORT     PREOPERATIVE DIAGNOSES: 1. Severe spinal stenosis L3-4, L4-5 2. Right-sided lumbar radiculopathy 3. L3-4, L4-5 spondylolisthesis   POSTOPERATIVE DIAGNOSES: 1. Severe spinal stenosis L3-4, L4-5 2. Right-sided lumbar radiculopathy 3. L3-4, L4-5 spondylolisthesis   PROCEDURES: 1. Lumbar decompression, L3-4, L4-5, including bilateral partial facetectomy, and bilateral lumbar decompression 2. Right-sided L3-4, L4-5 transforaminal lumbar interbody fusion. 3. Left-sided L3-4, L4-5 posterolateral fusion. 4. Insertion of interbody device x 2 (Concorde intervertebral spacers). 5. Placement of segmental posterior instrumentation L3, L4, L5, bilaterally. 6. Use of local autograft. 7. Use of morselized allograft - ViviGen. 8. Intraoperative use of fluoroscopy.   SURGEON:  Phylliss Bob, MD.   ASSISTANTPricilla Holm, PA-C.   ANESTHESIA:  General endotracheal anesthesia.   COMPLICATIONS:  None.   DISPOSITION:  Stable.   ESTIMATED BLOOD LOSS:  500cc, with 200cc retransfused via Cell Saver   INDICATIONS FOR SURGERY:  Briefly, [name] is a pleasant 70 y.o. -year-old patient who did present to me with severe and ongoing pain in the right leg. I did feel that the symptoms were secondary to the findings noted above. The patient failed conservative care and did wish to proceed with the procedure  noted above.    OPERATIVE DETAILS:  On 11/06/2020, the patient was brought to surgery and general endotracheal anesthesia was administered.  The patient was placed prone on a well-padded flat Jackson bed with a spinal frame.  Antibiotics were given and a time-out procedure was performed. The back was prepped and draped in the usual fashion.  A midline incision was made overlying the L3-4 and L4-5  intervertebral spaces.  The fascia was incised at the midline.  The paraspinal musculature was bluntly swept laterally.  Anatomic landmarks for the pedicles were exposed. Using fluoroscopy, I did cannulate the L3, L4, and L5 pedicles bilaterally, using a medial to lateral cortical trajectory technique.  On the left side, the posterolateral gutter and facet joints at L3-4 and L4-5 were decorticated and 6 mm screws of the appropriate length were placed at L3, L4, and L5 pedicles and a 65-mm rod was placed and distraction was applied across the rod at each intervertebral level.   Of note, the patient's bone quality was suboptimal, which was evident throughout tapping each of the pedicles. On the right side, the cannulated pedicle holes were filled with bone wax.  I then proceeded with the decompressive aspect of the procedure.     Starting at L4-5, I did perform a bilateral laminotomy and a full facetectomy on the right.  I was able to thoroughly and entirely decompress the L4-5 intervertebral space bilaterally, removing facet hypertrophy and ligamentum flavum hypertrophy on the right and left sides.  At this point, with an assistant holding medial retraction of the traversing right L5 nerve, I did perform a thorough and complete L4-5 intervertebral discectomy.  The intervertebral space was then liberally packed with autograft from the decompression, as well as allograft in the form of ViviGen, as was the appropriately sized intervertebral spacer (41mm, lordotic).  Distraction was then released on the contralateral left side.  I then turned my attention to the L3-4 level.  Once again, it was clearly evident that there was stenosis at the L3-4 level.  The stenosis was thoroughly and adequately decompressed bilaterally by performing a bilateral partial facetectomy. At this point, with an assistant holding medial retraction of the traversing right L4 nerve, I did perform an annulotomy at the posterolateral aspect  of the L3-4 intervertebral space.  I then used a series of curettes and pituitary rongeurs to perform a thorough and complete intervertebral diskectomy.  The intervertebral space was then liberally packed with autograft as well as allograft in the form of ViviGen, as was the appropriate-sized intervertebral spacer (61mm, parallel).  The spacer was then tamped into position in the usual fashion.  I was very pleased with the press-fit of the spacer.  I then placed 6 mm screws on the right at L3, L4, and L5.  A 65-mm rod was then placed and caps were placed. The distraction was then released on the contralateral left side.  All 6 caps were then locked.  The wound was copiously irrigated with a total of approximately 3 L prior to placing the bone graft.  Additional autograft and allograft were then packed into the posterolateral gutter on the right side to help aid in the success of the fusion.  The wound was  explored for any undue bleeding and there was no substantial bleeding encountered.   I did however elect to place a #15 Blake drain deep to the fascia. Gel-Foam was placed over the laminectomy site.  The wound was then closed in layers using #1 Vicryl followed by 2-0 Vicryl, followed by 4-0 Monocryl.  Benzoin and Steri-Strips were applied followed by sterile dressing.      Of note, Pricilla Holm was my assistant throughout surgery, and did aid in retraction, placement of the hardware, suctioning, and closure.       Phylliss Bob, MD

## 2020-11-06 NOTE — Plan of Care (Signed)
  Problem: Clinical Measurements: Goal: Ability to maintain clinical measurements within normal limits will improve Outcome: Progressing   

## 2020-11-06 NOTE — Transfer of Care (Signed)
Immediate Anesthesia Transfer of Care Note  Patient: [name]  Procedure(s) Performed: RIGHT-SIDED LUMBAR 3-4, LUMBAR 4-5 TRANSFORAMINAL LUMBAR INTERBODY FUSION WITH INSTRUMENTATION AND ALLOGRAFT (Right )  Patient Location: PACU  Anesthesia Type:General  Level of Consciousness: drowsy, patient cooperative and responds to stimulation  Airway & Oxygen Therapy: Patient Spontanous Breathing and Patient connected to face mask oxygen  Post-op Assessment: Report given to RN, Post -op Vital signs reviewed and stable and Patient moving all extremities X 4  Post vital signs: Reviewed and stable  Last Vitals:  Vitals Value Taken Time  BP 130/68 11/06/20 1425  Temp 36.7 C 11/06/20 1425  Pulse 86 11/06/20 1427  Resp 19 11/06/20 1427  SpO2 100 % 11/06/20 1427  Vitals shown include unvalidated device data.  Last Pain: There were no vitals filed for this visit.       Complications: No complications documented.

## 2020-11-07 ENCOUNTER — Encounter (HOSPITAL_COMMUNITY): Payer: Self-pay | Admitting: Orthopedic Surgery

## 2020-11-07 DIAGNOSIS — M48061 Spinal stenosis, lumbar region without neurogenic claudication: Secondary | ICD-10-CM | POA: Diagnosis not present

## 2020-11-07 DIAGNOSIS — Z96651 Presence of right artificial knee joint: Secondary | ICD-10-CM | POA: Diagnosis not present

## 2020-11-07 DIAGNOSIS — I10 Essential (primary) hypertension: Secondary | ICD-10-CM | POA: Diagnosis not present

## 2020-11-07 DIAGNOSIS — M4316 Spondylolisthesis, lumbar region: Secondary | ICD-10-CM | POA: Diagnosis not present

## 2020-11-07 DIAGNOSIS — E119 Type 2 diabetes mellitus without complications: Secondary | ICD-10-CM | POA: Diagnosis not present

## 2020-11-07 DIAGNOSIS — M5416 Radiculopathy, lumbar region: Secondary | ICD-10-CM | POA: Diagnosis not present

## 2020-11-07 LAB — GLUCOSE, CAPILLARY
Glucose-Capillary: 125 mg/dL — ABNORMAL HIGH (ref 70–99)
Glucose-Capillary: 112 mg/dL — ABNORMAL HIGH (ref 70–99)

## 2020-11-07 MED ORDER — HYDROCODONE-ACETAMINOPHEN 5-325 MG PO TABS: 1.0000 | ORAL_TABLET | ORAL | 0 refills | Status: DC | PRN

## 2020-11-07 MED ORDER — METHOCARBAMOL 500 MG PO TABS: 500.0000 mg | ORAL_TABLET | Freq: Four times a day (QID) | ORAL | 1 refills | Status: DC | PRN

## 2020-11-07 MED FILL — Sodium Chloride IV Soln 0.9%: INTRAVENOUS | Qty: 1000 | Status: AC

## 2020-11-07 MED FILL — Heparin Sodium (Porcine) Inj 1000 Unit/ML: INTRAMUSCULAR | Qty: 30 | Status: AC

## 2020-11-07 MED FILL — Thrombin (Recombinant) For Soln 20000 Unit: CUTANEOUS | Qty: 1 | Status: AC

## 2020-11-07 MED FILL — Sodium Chloride Irrigation Soln 0.9%: Qty: 3000 | Status: AC

## 2020-11-07 NOTE — Care Management CC44 (Signed)
Condition Code 44 Documentation Completed  Patient Details  Name: JAIDAN STACHNIK MRN: [id number] Date of Birth: 10/24/1950   Condition Code 44 given:  Yes Patient signature on Condition Code 44 notice:  Yes Documentation of 2 MD's agreement:  Yes Code 44 added to claim:  Yes    Angelita Ingles, RN 11/07/2020, 8:52 AM

## 2020-11-07 NOTE — Addendum Note (Signed)
Addendum  created 11/07/20 1056 by Roderic Palau, MD   Clinical Note Signed

## 2020-11-07 NOTE — Evaluation (Signed)
Physical Therapy Evaluation Patient Details Name: [name] MRN: [id number] DOB: 02-18-1951 Today's Date: 11/07/2020   History of Present Illness  Pt is a 70 y/o female who presents s/p L3-L5 TLIF on 11/06/2020. PMH significant for anxiety, arthritis, depression, DM, HTN, and R TKA.  Clinical Impression  Pt admitted with above diagnosis. At the time of PT eval, pt was able to demonstrate transfers and ambulation with gross min guard assist. Pt transitioned to RW due to pain control and was able to tolerate gait training fairly well without complaints of increased pain. She was motivated for distance. Pt was educated on precautions, brace application/wearing schedule, appropriate activity progression, and car transfer. Pt currently with functional limitations due to the deficits listed below (see PT Problem List). Pt will benefit from skilled PT to increase their independence and safety with mobility to allow discharge to the venue listed below.      Follow Up Recommendations No PT follow up;Supervision for mobility/OOB    Equipment Recommendations  Rolling walker with 5" wheels    Recommendations for Other Services       Precautions / Restrictions Precautions Precautions: Back Precaution Booklet Issued: Yes (comment) Precaution Comments: Providing education on back precautions and compensatory techniques for ADLs Restrictions Weight Bearing Restrictions: No      Mobility  Bed Mobility Overal bed mobility: Needs Assistance Bed Mobility: Rolling;Sit to Sidelying Rolling: Min guard       Sit to sidelying: Min guard General bed mobility comments: HOB slightly elevated for comfort but rails lowered to simulate home environment. Increased time due to pain and pt was able to transition to sitting without assistance.    Transfers Overall transfer level: Needs assistance Equipment used: None Transfers: Sit to/from Stand Sit to Stand: Supervision         General transfer  comment: Supervision for safety. VC's for hand placement on seated surface for safety as for wide BOS.  Ambulation/Gait Ambulation/Gait assistance: Min guard;Supervision Gait Distance (Feet): 200 Feet Assistive device: Rolling walker (2 wheeled);None Gait Pattern/deviations: Step-through pattern;Decreased stride length;Trunk flexed Gait velocity: Decreased Gait velocity interpretation: 1.31 - 2.62 ft/sec, indicative of limited community ambulator General Gait Details: VC's for improved posture. Pt with increased pain without UE support and demonstrating short step/stride length.  Stairs            Wheelchair Mobility    Modified Rankin (Stroke Patients Only)       Balance Overall balance assessment: No apparent balance deficits (not formally assessed)                                           Pertinent Vitals/Pain Pain Assessment: Faces Faces Pain Scale: Hurts little more Pain Location: back Pain Descriptors / Indicators: Discomfort;Grimacing;Constant    Home Living Family/patient expects to be discharged to:: Private residence Living Arrangements: Spouse/significant other Available Help at Discharge: Family;Friend(s);Available 24 hours/day Type of Home: House Home Access: Ramped entrance     Home Layout: One level Home Equipment: Walker - 2 wheels;Shower seat - built in;Toilet riser;Grab bars - tub/shower;Hand held shower head;Adaptive equipment      Prior Function Level of Independence: Independent               Hand Dominance        Extremity/Trunk Assessment   Upper Extremity Assessment Upper Extremity Assessment: Defer to OT evaluation    Lower  Extremity Assessment Lower Extremity Assessment: Generalized weakness (Consistent with pre-op diagnosis)    Cervical / Trunk Assessment Cervical / Trunk Assessment: Other exceptions Cervical / Trunk Exceptions: s/p back sx  Communication   Communication: No difficulties   Cognition Arousal/Alertness: Awake/alert Behavior During Therapy: WFL for tasks assessed/performed Overall Cognitive Status: Within Functional Limits for tasks assessed                                 General Comments: Sleepy and reports she didnt sleep well due to pain      General Comments      Exercises     Assessment/Plan    PT Assessment Patient needs continued PT services  PT Problem List Decreased strength;Decreased activity tolerance;Decreased balance;Decreased mobility;Decreased knowledge of use of DME;Decreased safety awareness;Decreased knowledge of precautions;Pain       PT Treatment Interventions DME instruction;Functional mobility training;Gait training;Therapeutic activities;Therapeutic exercise;Neuromuscular re-education;Patient/family education    PT Goals (Current goals can be found in the Care Plan section)  Acute Rehab PT Goals Patient Stated Goal: Go home PT Goal Formulation: With patient/family Time For Goal Achievement: 11/14/20 Potential to Achieve Goals: Good    Frequency Min 5X/week   Barriers to discharge        Co-evaluation               AM-PAC PT "6 Clicks" Mobility  Outcome Measure Help needed turning from your back to your side while in a flat bed without using bedrails?: None Help needed moving from lying on your back to sitting on the side of a flat bed without using bedrails?: None Help needed moving to and from a bed to a chair (including a wheelchair)?: A Little Help needed standing up from a chair using your arms (e.g., wheelchair or bedside chair)?: A Little Help needed to walk in hospital room?: A Little Help needed climbing 3-5 steps with a railing? : A Little 6 Click Score: 20    End of Session Equipment Utilized During Treatment: Gait belt;Back brace Activity Tolerance: Patient limited by pain Patient left: in bed;with call bell/phone within reach;with family/visitor present Nurse Communication:  Mobility status PT Visit Diagnosis: Unsteadiness on feet (R26.81);Pain Pain - part of body:  (back)    Time: 2376-2831 PT Time Calculation (min) (ACUTE ONLY): 38 min   Charges:   PT Evaluation $PT Eval Low Complexity: 1 Low PT Treatments $Gait Training: 23-37 mins        Rolinda Roan, PT, DPT Acute Rehabilitation Services Pager: [phone] Office: [phone]   Thelma Comp 11/07/2020, 2:01 PM

## 2020-11-07 NOTE — Progress Notes (Signed)
Patient is discharged from room 3C08 at this time. Alert and in stable condition. IV site d/c'd and instructions read to patient and spouse with understanding verbalized and all questions answered. Left unit via wheelchair with all belongings at side.

## 2020-11-07 NOTE — Evaluation (Signed)
Occupational Therapy Evaluation Patient Details Name: [name] MRN: [id number] DOB: December 13, 1950 Today's Date: 11/07/2020    History of Present Illness 70 yo female with ongoing RLE pain. MRI reveals spinal stenosis and instability at L3/4 and L4/5. s/p L 3-4 fusion. PMH including anxiety, arthritis, depression, DM, HTN, and R TKA.   Clinical Impression   PTA, pt was living with her husband and was independent. Currently, pt requires Supervision for ADLs and functional mobility using RW. Provided education and handout on back precautions, bed mobility, brace management, grooming, LB ADLs, toileting, and shower transfer; pt demonstrated understanding. Answered all pt questions. Recommend dc home once medically stable per physician. All acute OT needs met and will sign off. Thank you.    Follow Up Recommendations  No OT follow up;Supervision - Intermittent    Equipment Recommendations  3 in 1 bedside commode    Recommendations for Other Services       Precautions / Restrictions Precautions Precautions: Back Precaution Booklet Issued: Yes (comment) Precaution Comments: Providing education on back precautions and compensatory techniques for ADLs Restrictions Weight Bearing Restrictions: No      Mobility Bed Mobility Overal bed mobility: Needs Assistance Bed Mobility: Rolling;Sit to Sidelying Rolling: Min guard       Sit to sidelying: Min guard General bed mobility comments: Min Guard A for safety    Transfers Overall transfer level: Needs assistance Equipment used: None Transfers: Sit to/from Stand Sit to Stand: Supervision         General transfer comment: Supervision for safety    Balance Overall balance assessment: No apparent balance deficits (not formally assessed)                                         ADL either performed or assessed with clinical judgement   ADL Overall ADL's : Needs assistance/impaired Eating/Feeding: Set  up;Sitting   Grooming: Supervision/safety;Set up;Standing Grooming Details (indicate cue type and reason): educating pt on compensatory techniques for grooming Upper Body Bathing: Supervision/ safety;Set up;Sitting   Lower Body Bathing: Supervison/ safety   Upper Body Dressing : Supervision/safety;Sitting Upper Body Dressing Details (indicate cue type and reason): pt donning shirt and brace with supervision. CUes for positioning of brace Lower Body Dressing: Supervision/safety;Sit to/from stand Lower Body Dressing Details (indicate cue type and reason): providing education on compensatory techniques for LB ADLs using figure four method and reacher Toilet Transfer: Supervision/safety;Ambulation;Regular Toilet;Grab bars       Tub/ Shower Transfer: Walk-in Public librarian Details (indicate cue type and reason): discussed using 3N1 instead of build in seat due to shower seat height Functional mobility during ADLs: Supervision/safety General ADL Comments: Pt performing Supervision level. Providing education on back precautions, bed mobility, brace management, LB ADLs, grooming, toileting, and shower transfer.      Vision Baseline Vision/History: Wears glasses Patient Visual Report: No change from baseline       Perception     Praxis      Pertinent Vitals/Pain Pain Assessment: Faces Faces Pain Scale: Hurts little more Pain Location: back Pain Descriptors / Indicators: Discomfort;Grimacing;Constant     Hand Dominance     Extremity/Trunk Assessment Upper Extremity Assessment Upper Extremity Assessment: Overall WFL for tasks assessed   Lower Extremity Assessment Lower Extremity Assessment: Defer to PT evaluation   Cervical / Trunk Assessment Cervical / Trunk Assessment: Other exceptions Cervical / Trunk Exceptions: s/p back sx  Communication Communication Communication: No difficulties   Cognition Arousal/Alertness: Awake/alert Behavior  During Therapy: WFL for tasks assessed/performed Overall Cognitive Status: Within Functional Limits for tasks assessed                                 General Comments: Sleepy and reports she didnt sleep well due to pain   General Comments       Exercises     Shoulder Instructions      Home Living Family/patient expects to be discharged to:: Private residence Living Arrangements: Spouse/significant other Available Help at Discharge: Family;Friend(s);Available 24 hours/day (Renting baseline to friends) Type of Home: House Home Access: Ramped entrance     Home Layout: One level     Bathroom Shower/Tub: Walk-in shower;Tub/shower unit   Bathroom Toilet: Standard     Home Equipment: Environmental consultant - 2 wheels;Shower seat - built in;Toilet riser;Grab bars - tub/shower;Hand held shower head;Adaptive equipment Adaptive Equipment: Reacher        Prior Functioning/Environment Level of Independence: Independent                 OT Problem List: Decreased activity tolerance;Decreased knowledge of use of DME or AE;Decreased knowledge of precautions;Pain      OT Treatment/Interventions:      OT Goals(Current goals can be found in the care plan section) Acute Rehab OT Goals Patient Stated Goal: Go home OT Goal Formulation: All assessment and education complete, DC therapy  OT Frequency:     Barriers to D/C:            Co-evaluation              AM-PAC OT "6 Clicks" Daily Activity     Outcome Measure Help from another person eating meals?: None Help from another person taking care of personal grooming?: None Help from another person toileting, which includes using toliet, bedpan, or urinal?: A Little Help from another person bathing (including washing, rinsing, drying)?: A Little Help from another person to put on and taking off regular upper body clothing?: None Help from another person to put on and taking off regular lower body clothing?: A Little 6  Click Score: 21   End of Session Equipment Utilized During Treatment: Back brace Nurse Communication: Mobility status  Activity Tolerance: Patient tolerated treatment well Patient left: in bed;with call bell/phone within reach;with family/visitor present  OT Visit Diagnosis: Unsteadiness on feet (R26.81);Other abnormalities of gait and mobility (R26.89);Muscle weakness (generalized) (M62.81);Pain Pain - part of body:  (Back)                Time: 8412-8208 OT Time Calculation (min): 28 min Charges:  OT General Charges $OT Visit: 1 Visit OT Evaluation $OT Eval Low Complexity: 1 Low OT Treatments $Self Care/Home Management : 8-22 mins  Leverett Camplin MSOT, OTR/L Acute Rehab Pager: [phone] Office: [place] 11/07/2020, 11:29 AM

## 2020-11-07 NOTE — Discharge Instructions (Signed)

## 2020-11-07 NOTE — Care Management Obs Status (Signed)
MEDICARE OBSERVATION STATUS NOTIFICATION   Patient Details  Name: [name] MRN: [id number] Date of Birth: [birth year]   Medicare Observation Status Notification Given:  Yes    Angelita Ingles, RN 11/07/2020, 8:51 AM

## 2020-11-07 NOTE — Progress Notes (Signed)
    Patient doing well, denies leg pain Has been ambulating   Physical Exam: Vitals:   11/06/20 2325 11/07/20 0333  BP: (!) 106/50 (!) 122/53  Pulse: 82 88  Resp: 20 20  Temp: 98.3 F (36.8 C) 98.1 F (36.7 C)  SpO2: 95% 94%    Dressing in place NVI  Drain output: 40cc/2 hours  POD #1 s/p L3-L5 decompression and fusion, doing well  - up with PT/OT, encourage ambulation - Norco for pain, Robaxin for muscle spasms - likely d/c home today with f/u in 2 weeks - will need to evaluate drain output prior to discharge

## 2020-11-07 NOTE — Progress Notes (Signed)
Called to see patient due to "anesthesia complication". When I saw the patient she had a noticeably swollen lower lip. There were no lacerations noted and her teeth were intact. I described the process of intubation and prone positioning and how those processes put the lips and teeth at risk of injury. I told her that her lip swelling should resolve in the next couple of days and she was satisfied with that.

## 2020-11-08 NOTE — Addendum Note (Signed)
Addendum  created 11/08/20 0657 by [name], MD   Intraprocedure Staff edited

## 2020-11-14 DIAGNOSIS — N3010 Interstitial cystitis (chronic) without hematuria: Secondary | ICD-10-CM | POA: Diagnosis not present

## 2020-11-14 DIAGNOSIS — N3945 Continuous leakage: Secondary | ICD-10-CM | POA: Diagnosis not present

## 2020-11-20 NOTE — Discharge Summary (Signed)
Patient ID: [name] MRN: [id number] DOB/AGE: May 07, 1951 70 y.o.  Admit date: 11/06/2020 Discharge date: 11/07/2020  Admission Diagnoses:  Active Problems:   Radiculopathy   Discharge Diagnoses:  Same  Past Medical History:  Diagnosis Date  . Anxiety   . Arthritis   . Depression   . DM (diabetes mellitus) ([place])    Diet controlled  . Dysrhythmia    SVT  . Goiter    neg biopsy  . Hyperlipidemia   . Hypertension   . Migraine headache   . PONV (postoperative nausea and vomiting)   . SVT (supraventricular tachycardia) ([place])   . Ulcerative colitis ([place])     Surgeries: Procedure(s): RIGHT-SIDED LUMBAR 3-4, LUMBAR 4-5 TRANSFORAMINAL LUMBAR INTERBODY FUSION WITH INSTRUMENTATION AND ALLOGRAFT on 11/06/2020   Consultants: none  Discharged Condition: Improved  Hospital Course: [name] is an 70 y.o. female who was admitted 11/06/2020 for operative treatment of radiculopathy. Patient has severe unremitting pain that affects sleep, daily activities, and work/hobbies. After pre-op clearance the patient was taken to the operating room on 11/06/2020 and underwent  Procedure(s): RIGHT-SIDED LUMBAR 3-4, LUMBAR 4-5 TRANSFORAMINAL LUMBAR INTERBODY FUSION WITH INSTRUMENTATION AND ALLOGRAFT.    Patient was given perioperative antibiotics:  Anti-infectives (From admission, onward)   Start     Dose/Rate Route Frequency Ordered Stop   11/06/20 1800  vancomycin (VANCOREADY) IVPB 1250 mg/250 mL  Status:  Discontinued        1,250 mg 166.7 mL/hr over 90 Minutes Intravenous Every 24 hours 11/06/20 1729 11/07/20 1948   11/06/20 0600  vancomycin (VANCOREADY) IVPB 1500 mg/300 mL        1,500 mg 150 mL/hr over 120 Minutes Intravenous On call to O.R. 11/05/20 1300 11/06/20 0840       Patient was given sequential compression devices, early ambulation to prevent DVT.  Patient benefited maximally from hospital stay and there were no complications.    Recent vital signs: BP  121/82 (BP Location: Right Arm)   Pulse 84   Temp 98.2 F (36.8 C) (Oral)   Resp 18   SpO2 96%    Discharge Medications:   Allergies as of 11/07/2020      Reactions   Amoxicillin    REACTION: Hives   Biaxin [clarithromycin]    REACTION: Hives   Crestor [rosuvastatin Calcium]    ARM PAIN   Doxycycline    REACTION: Hives   Penicillins    REACTION: Hives   Pravachol    ARM PAIN   Topamax [topiramate] Anxiety      Medication List    TAKE these medications   ALPRAZolam 0.5 MG tablet Commonly known as: XANAX Take 0.5 mg by mouth at bedtime as needed for anxiety or sleep.   atorvastatin 20 MG tablet Commonly known as: LIPITOR Take 20 mg by mouth daily.   colestipol 1 g tablet Commonly known as: COLESTID Take 2 g by mouth 2 (two) times daily.   diphenhydrAMINE 25 MG tablet Commonly known as: BENADRYL Take 25 mg by mouth daily.   HYDROcodone-acetaminophen 5-325 MG tablet Commonly known as: NORCO/VICODIN Take 1 tablet by mouth every 4 (four) hours as needed for moderate pain ((score 4 to 6)).   methocarbamol 500 MG tablet Commonly known as: ROBAXIN Take 1 tablet (500 mg total) by mouth every 6 (six) hours as needed for muscle spasms.   metoprolol succinate 100 MG 24 hr tablet Commonly known as: TOPROL-XL Take 50 mg by mouth at bedtime.   multivitamin  with minerals tablet Take 1 tablet by mouth daily.   PRO-BIOTIC BLEND PO Take 1 capsule by mouth daily.   propranolol 10 MG tablet Commonly known as: INDERAL Take 10 mg by mouth daily as needed. For svt episodes   rizatriptan 10 MG tablet Commonly known as: MAXALT Take 10 mg by mouth daily as needed. May repeat in 2 hours if needed. For migraines   sertraline 100 MG tablet Commonly known as: ZOLOFT Take 200 mg by mouth daily.   traMADol 50 MG tablet Commonly known as: ULTRAM Take 50 mg by mouth 2 (two) times daily as needed for severe pain.   zolpidem 10 MG tablet Commonly known as: AMBIEN Take 10 mg  by mouth at bedtime.       Diagnostic Studies: DG Lumbar Spine 2-3 Views  Result Date: 11/06/2020 CLINICAL DATA:  Elective surgery. Additional history provided: L3-5 PLIF. Provided fluoroscopy time 1 minutes, 33 seconds (89.34 mGy). EXAM: LUMBAR SPINE - 2-3 VIEW; DG C-ARM 1-60 MIN COMPARISON:  Lumbar spine MRI 06/15/2020. FINDINGS: AP and lateral view intraoperative fluoroscopic images of the lumbar spine are submitted, 4 images total. The lowest well-formed intervertebral disc space is designated L5-S1. On the provided images, a posterior spinal fusion construct is present at L3-L5 (bilateral pedicle screws and vertical interconnecting rods). Interbody devices are also now present at L1-L2 and L2-L3. On the provided AP fluoroscopic images, a rectangular opacity projects over the sacrum. IMPRESSION: Four intraoperative fluoroscopic images from L3-L5 fusion, as described. On the provided AP fluoroscopic images, a rectangular opacity projects over the sacrum. This may overlie the patient. However, correlation with the operative history is recommended. Electronically Signed   By: Kellie Simmering DO   On: 11/06/2020 13:52   DG Lumbar Spine 1 View  Result Date: 11/06/2020 CLINICAL DATA:  Intraoperative localization for surgery EXAM: LUMBAR SPINE - 1 VIEW COMPARISON:  September 03, 2020 FINDINGS: Cross-table lateral lumbar spine labeled image #1 submitted. On this image, there is a metallic probe with tip posterior to the inferior L4 level. A second metallic probe tip is posterior to the inferior aspect of S2. No evident fracture. There is 2 mm of anterolisthesis of L4 on L5. There is moderate disc space narrowing at L3-4 and L5-S1. IMPRESSION: Metallic probes with tips posterior to the inferior aspect of the L4 vertebral body in the inferior aspect of the S2 vertebral body respectively. No fracture. Disc space narrowing at L3-4 and L5-S1. 2 mm of anterolisthesis of L4 on L5 noted. Electronically Signed   By:  Lowella Grip III M.D.   On: 11/06/2020 15:20   DG C-Arm 1-60 Min  Result Date: 11/06/2020 CLINICAL DATA:  Elective surgery. Additional history provided: L3-5 PLIF. Provided fluoroscopy time 1 minutes, 33 seconds (89.34 mGy). EXAM: LUMBAR SPINE - 2-3 VIEW; DG C-ARM 1-60 MIN COMPARISON:  Lumbar spine MRI 06/15/2020. FINDINGS: AP and lateral view intraoperative fluoroscopic images of the lumbar spine are submitted, 4 images total. The lowest well-formed intervertebral disc space is designated L5-S1. On the provided images, a posterior spinal fusion construct is present at L3-L5 (bilateral pedicle screws and vertical interconnecting rods). Interbody devices are also now present at L1-L2 and L2-L3. On the provided AP fluoroscopic images, a rectangular opacity projects over the sacrum. IMPRESSION: Four intraoperative fluoroscopic images from L3-L5 fusion, as described. On the provided AP fluoroscopic images, a rectangular opacity projects over the sacrum. This may overlie the patient. However, correlation with the operative history is recommended. Electronically Signed  By: Kellie Simmering DO   On: 11/06/2020 13:52    Disposition: Discharge disposition: 01-Home or Self Care        POD #1 s/p L3-L5 decompression and fusion, doing well  - up with PT/OT, encourage ambulation - Norco for pain, Robaxin for muscle spasms -Scripts for pain sent to pharmacy electronically  -D/C instructions sheet printed and in chart -D/C today  -F/U in office 2 weeks   Signed: Lennie Muckle Littie Chiem 11/20/2020, 11:47 AM

## 2020-12-12 DIAGNOSIS — Z780 Asymptomatic menopausal state: Secondary | ICD-10-CM | POA: Diagnosis not present

## 2020-12-20 DIAGNOSIS — M48061 Spinal stenosis, lumbar region without neurogenic claudication: Secondary | ICD-10-CM | POA: Diagnosis not present

## 2020-12-20 DIAGNOSIS — Z9889 Other specified postprocedural states: Secondary | ICD-10-CM | POA: Diagnosis not present

## 2020-12-20 DIAGNOSIS — M4316 Spondylolisthesis, lumbar region: Secondary | ICD-10-CM | POA: Diagnosis not present

## 2021-01-02 DIAGNOSIS — R350 Frequency of micturition: Secondary | ICD-10-CM | POA: Diagnosis not present

## 2021-01-02 DIAGNOSIS — N3946 Mixed incontinence: Secondary | ICD-10-CM | POA: Diagnosis not present

## 2021-01-02 DIAGNOSIS — N3010 Interstitial cystitis (chronic) without hematuria: Secondary | ICD-10-CM | POA: Diagnosis not present

## 2021-01-30 DIAGNOSIS — Z20822 Contact with and (suspected) exposure to covid-19: Secondary | ICD-10-CM | POA: Diagnosis not present

## 2021-01-30 DIAGNOSIS — U071 COVID-19: Secondary | ICD-10-CM | POA: Diagnosis not present

## 2021-02-05 DIAGNOSIS — Z20822 Contact with and (suspected) exposure to covid-19: Secondary | ICD-10-CM | POA: Diagnosis not present

## 2021-02-09 DIAGNOSIS — Z20822 Contact with and (suspected) exposure to covid-19: Secondary | ICD-10-CM | POA: Diagnosis not present

## 2021-02-12 DIAGNOSIS — Z20822 Contact with and (suspected) exposure to covid-19: Secondary | ICD-10-CM | POA: Diagnosis not present

## 2021-02-18 DIAGNOSIS — M5416 Radiculopathy, lumbar region: Secondary | ICD-10-CM | POA: Diagnosis not present

## 2021-03-04 DIAGNOSIS — M4326 Fusion of spine, lumbar region: Secondary | ICD-10-CM | POA: Diagnosis not present

## 2021-03-07 DIAGNOSIS — M4326 Fusion of spine, lumbar region: Secondary | ICD-10-CM | POA: Diagnosis not present

## 2021-03-10 DIAGNOSIS — M4326 Fusion of spine, lumbar region: Secondary | ICD-10-CM | POA: Diagnosis not present

## 2021-03-12 DIAGNOSIS — M4326 Fusion of spine, lumbar region: Secondary | ICD-10-CM | POA: Diagnosis not present

## 2021-03-14 DIAGNOSIS — F419 Anxiety disorder, unspecified: Secondary | ICD-10-CM | POA: Diagnosis not present

## 2021-03-14 DIAGNOSIS — Z23 Encounter for immunization: Secondary | ICD-10-CM | POA: Diagnosis not present

## 2021-03-14 DIAGNOSIS — F5101 Primary insomnia: Secondary | ICD-10-CM | POA: Diagnosis not present

## 2021-03-14 DIAGNOSIS — E119 Type 2 diabetes mellitus without complications: Secondary | ICD-10-CM | POA: Diagnosis not present

## 2021-03-14 DIAGNOSIS — F320 Major depressive disorder, single episode, mild: Secondary | ICD-10-CM | POA: Diagnosis not present

## 2021-03-14 DIAGNOSIS — K580 Irritable bowel syndrome with diarrhea: Secondary | ICD-10-CM | POA: Diagnosis not present

## 2021-03-14 DIAGNOSIS — I10 Essential (primary) hypertension: Secondary | ICD-10-CM | POA: Diagnosis not present

## 2021-03-14 DIAGNOSIS — E7800 Pure hypercholesterolemia, unspecified: Secondary | ICD-10-CM | POA: Diagnosis not present

## 2021-03-14 DIAGNOSIS — Z0000 Encounter for general adult medical examination without abnormal findings: Secondary | ICD-10-CM | POA: Diagnosis not present

## 2021-03-14 DIAGNOSIS — G250 Essential tremor: Secondary | ICD-10-CM | POA: Diagnosis not present

## 2021-03-14 DIAGNOSIS — G8929 Other chronic pain: Secondary | ICD-10-CM | POA: Diagnosis not present

## 2021-03-19 DIAGNOSIS — M4326 Fusion of spine, lumbar region: Secondary | ICD-10-CM | POA: Diagnosis not present

## 2021-03-21 DIAGNOSIS — M4326 Fusion of spine, lumbar region: Secondary | ICD-10-CM | POA: Diagnosis not present

## 2021-03-24 DIAGNOSIS — M4326 Fusion of spine, lumbar region: Secondary | ICD-10-CM | POA: Diagnosis not present

## 2021-04-05 DIAGNOSIS — Z20822 Contact with and (suspected) exposure to covid-19: Secondary | ICD-10-CM | POA: Diagnosis not present

## 2021-04-16 DIAGNOSIS — H2513 Age-related nuclear cataract, bilateral: Secondary | ICD-10-CM | POA: Diagnosis not present

## 2021-04-16 DIAGNOSIS — H524 Presbyopia: Secondary | ICD-10-CM | POA: Diagnosis not present

## 2021-04-16 DIAGNOSIS — H43813 Vitreous degeneration, bilateral: Secondary | ICD-10-CM | POA: Diagnosis not present

## 2021-04-16 DIAGNOSIS — R7303 Prediabetes: Secondary | ICD-10-CM | POA: Diagnosis not present

## 2021-05-05 ENCOUNTER — Other Ambulatory Visit: Payer: Self-pay | Admitting: Family Medicine

## 2021-05-05 DIAGNOSIS — S8391XA Sprain of unspecified site of right knee, initial encounter: Secondary | ICD-10-CM | POA: Diagnosis not present

## 2021-05-05 DIAGNOSIS — M25551 Pain in right hip: Secondary | ICD-10-CM | POA: Diagnosis not present

## 2021-05-05 DIAGNOSIS — R102 Pelvic and perineal pain: Secondary | ICD-10-CM

## 2021-05-06 ENCOUNTER — Ambulatory Visit
Admission: RE | Admit: 2021-05-06 | Discharge: 2021-05-06 | Disposition: A | Discharge status: Home / Self Care | Payer: Medicare Other | Source: Ambulatory Visit | Attending: Family Medicine | Admitting: Family Medicine

## 2021-05-06 ENCOUNTER — Other Ambulatory Visit: Payer: Self-pay

## 2021-05-06 DIAGNOSIS — R102 Pelvic and perineal pain: Secondary | ICD-10-CM | POA: Diagnosis not present

## 2021-05-12 DIAGNOSIS — M25551 Pain in right hip: Secondary | ICD-10-CM | POA: Diagnosis not present

## 2021-05-21 DIAGNOSIS — M6281 Muscle weakness (generalized): Secondary | ICD-10-CM | POA: Diagnosis not present

## 2021-05-21 DIAGNOSIS — R2681 Unsteadiness on feet: Secondary | ICD-10-CM | POA: Diagnosis not present

## 2021-05-21 DIAGNOSIS — M7061 Trochanteric bursitis, right hip: Secondary | ICD-10-CM | POA: Diagnosis not present

## 2021-05-26 DIAGNOSIS — M6281 Muscle weakness (generalized): Secondary | ICD-10-CM | POA: Diagnosis not present

## 2021-05-26 DIAGNOSIS — R2681 Unsteadiness on feet: Secondary | ICD-10-CM | POA: Diagnosis not present

## 2021-05-26 DIAGNOSIS — M7061 Trochanteric bursitis, right hip: Secondary | ICD-10-CM | POA: Diagnosis not present

## 2021-05-28 DIAGNOSIS — M7061 Trochanteric bursitis, right hip: Secondary | ICD-10-CM | POA: Diagnosis not present

## 2021-05-28 DIAGNOSIS — R2681 Unsteadiness on feet: Secondary | ICD-10-CM | POA: Diagnosis not present

## 2021-05-28 DIAGNOSIS — M6281 Muscle weakness (generalized): Secondary | ICD-10-CM | POA: Diagnosis not present

## 2021-06-03 DIAGNOSIS — R2681 Unsteadiness on feet: Secondary | ICD-10-CM | POA: Diagnosis not present

## 2021-06-03 DIAGNOSIS — M6281 Muscle weakness (generalized): Secondary | ICD-10-CM | POA: Diagnosis not present

## 2021-06-03 DIAGNOSIS — M7061 Trochanteric bursitis, right hip: Secondary | ICD-10-CM | POA: Diagnosis not present

## 2021-06-09 DIAGNOSIS — M7061 Trochanteric bursitis, right hip: Secondary | ICD-10-CM | POA: Diagnosis not present

## 2021-06-09 DIAGNOSIS — M6281 Muscle weakness (generalized): Secondary | ICD-10-CM | POA: Diagnosis not present

## 2021-06-09 DIAGNOSIS — R2681 Unsteadiness on feet: Secondary | ICD-10-CM | POA: Diagnosis not present

## 2021-06-11 DIAGNOSIS — R2681 Unsteadiness on feet: Secondary | ICD-10-CM | POA: Diagnosis not present

## 2021-06-11 DIAGNOSIS — M7061 Trochanteric bursitis, right hip: Secondary | ICD-10-CM | POA: Diagnosis not present

## 2021-06-11 DIAGNOSIS — M6281 Muscle weakness (generalized): Secondary | ICD-10-CM | POA: Diagnosis not present

## 2021-06-16 DIAGNOSIS — R2681 Unsteadiness on feet: Secondary | ICD-10-CM | POA: Diagnosis not present

## 2021-06-16 DIAGNOSIS — M7061 Trochanteric bursitis, right hip: Secondary | ICD-10-CM | POA: Diagnosis not present

## 2021-06-16 DIAGNOSIS — M6281 Muscle weakness (generalized): Secondary | ICD-10-CM | POA: Diagnosis not present

## 2021-06-18 DIAGNOSIS — M7061 Trochanteric bursitis, right hip: Secondary | ICD-10-CM | POA: Diagnosis not present

## 2021-06-18 DIAGNOSIS — R2681 Unsteadiness on feet: Secondary | ICD-10-CM | POA: Diagnosis not present

## 2021-06-18 DIAGNOSIS — M6281 Muscle weakness (generalized): Secondary | ICD-10-CM | POA: Diagnosis not present

## 2021-07-01 DIAGNOSIS — M5441 Lumbago with sciatica, right side: Secondary | ICD-10-CM | POA: Diagnosis not present

## 2021-07-09 DIAGNOSIS — M545 Low back pain, unspecified: Secondary | ICD-10-CM | POA: Diagnosis not present

## 2021-07-18 DIAGNOSIS — M545 Low back pain, unspecified: Secondary | ICD-10-CM | POA: Diagnosis not present

## 2021-07-24 DIAGNOSIS — Z1231 Encounter for screening mammogram for malignant neoplasm of breast: Secondary | ICD-10-CM | POA: Diagnosis not present

## 2021-08-18 DIAGNOSIS — M47816 Spondylosis without myelopathy or radiculopathy, lumbar region: Secondary | ICD-10-CM | POA: Diagnosis not present

## 2021-08-18 DIAGNOSIS — M461 Sacroiliitis, not elsewhere classified: Secondary | ICD-10-CM | POA: Diagnosis not present

## 2021-08-22 DIAGNOSIS — M461 Sacroiliitis, not elsewhere classified: Secondary | ICD-10-CM | POA: Diagnosis not present

## 2021-09-09 DIAGNOSIS — M461 Sacroiliitis, not elsewhere classified: Secondary | ICD-10-CM | POA: Diagnosis not present

## 2021-09-09 DIAGNOSIS — M47816 Spondylosis without myelopathy or radiculopathy, lumbar region: Secondary | ICD-10-CM | POA: Diagnosis not present

## 2021-11-02 DIAGNOSIS — U071 COVID-19: Secondary | ICD-10-CM | POA: Diagnosis not present

## 2021-12-10 DIAGNOSIS — M461 Sacroiliitis, not elsewhere classified: Secondary | ICD-10-CM | POA: Diagnosis not present

## 2021-12-13 ENCOUNTER — Emergency Department (HOSPITAL_BASED_OUTPATIENT_CLINIC_OR_DEPARTMENT_OTHER): Payer: Medicare Other

## 2021-12-13 ENCOUNTER — Other Ambulatory Visit: Payer: Self-pay

## 2021-12-13 ENCOUNTER — Encounter (HOSPITAL_BASED_OUTPATIENT_CLINIC_OR_DEPARTMENT_OTHER): Payer: Self-pay

## 2021-12-13 ENCOUNTER — Observation Stay (HOSPITAL_BASED_OUTPATIENT_CLINIC_OR_DEPARTMENT_OTHER)
Admission: EM | Admit: 2021-12-13 | Discharge: 2021-12-14 | Disposition: A | Discharge status: Home / Self Care | Payer: Medicare Other | Attending: Internal Medicine | Admitting: Internal Medicine

## 2021-12-13 DIAGNOSIS — Z96651 Presence of right artificial knee joint: Secondary | ICD-10-CM | POA: Diagnosis not present

## 2021-12-13 DIAGNOSIS — R072 Precordial pain: Principal | ICD-10-CM | POA: Insufficient documentation

## 2021-12-13 DIAGNOSIS — I208 Other forms of angina pectoris: Secondary | ICD-10-CM | POA: Insufficient documentation

## 2021-12-13 DIAGNOSIS — Z79899 Other long term (current) drug therapy: Secondary | ICD-10-CM | POA: Diagnosis not present

## 2021-12-13 DIAGNOSIS — I10 Essential (primary) hypertension: Secondary | ICD-10-CM | POA: Insufficient documentation

## 2021-12-13 DIAGNOSIS — R0789 Other chest pain: Secondary | ICD-10-CM | POA: Diagnosis not present

## 2021-12-13 DIAGNOSIS — E1165 Type 2 diabetes mellitus with hyperglycemia: Secondary | ICD-10-CM | POA: Insufficient documentation

## 2021-12-13 DIAGNOSIS — R079 Chest pain, unspecified: Secondary | ICD-10-CM | POA: Diagnosis not present

## 2021-12-13 DIAGNOSIS — I209 Angina pectoris, unspecified: Secondary | ICD-10-CM | POA: Diagnosis not present

## 2021-12-13 LAB — BASIC METABOLIC PANEL
Anion gap: 11 (ref 5–15)
BUN: 25 mg/dL — ABNORMAL HIGH (ref 8–23)
CO2: 23 mmol/L (ref 22–32)
Calcium: 10.1 mg/dL (ref 8.9–10.3)
Chloride: 103 mmol/L (ref 98–111)
Creatinine, Ser: 0.99 mg/dL (ref 0.44–1.00)
GFR, Estimated: >60 mL/min (ref >60)
Glucose, Bld: 164 mg/dL — ABNORMAL HIGH (ref 70–99)
Potassium: 3.8 mmol/L (ref 3.5–5.1)
Sodium: 137 mmol/L (ref 135–145)

## 2021-12-13 LAB — PROTIME-INR
INR: 1.1 (ref 0.8–1.2)
Prothrombin Time: 13.7 seconds (ref 11.4–15.2)

## 2021-12-13 LAB — CBC
HCT: 45.7 % (ref 36.0–46.0)
Hemoglobin: 15.7 g/dL — ABNORMAL HIGH (ref 12.0–15.0)
MCH: 31.2 pg (ref 26.0–34.0)
MCHC: 34.4 g/dL (ref 30.0–36.0)
MCV: 90.7 fL (ref 80.0–100.0)
Platelets: 287 10*3/uL (ref 150–400)
RBC: 5.04 MIL/uL (ref 3.87–5.11)
RDW: 13 % (ref 11.5–15.5)
WBC: 11.2 10*3/uL — ABNORMAL HIGH (ref 4.0–10.5)
nRBC: 0 % (ref 0.0–0.2)

## 2021-12-13 LAB — TROPONIN I (HIGH SENSITIVITY)
Troponin I (High Sensitivity): 6 ng/L (ref <18)
Troponin I (High Sensitivity): 7 ng/L (ref <18)

## 2021-12-13 LAB — GLUCOSE, CAPILLARY: Glucose-Capillary: 179 mg/dL — ABNORMAL HIGH (ref 70–99)

## 2021-12-13 MED ORDER — NITROGLYCERIN 0.4 MG SL SUBL
0.4000 mg | SUBLINGUAL_TABLET | SUBLINGUAL | Status: DC | PRN
  Administered 2021-12-13: 0.4 mg via SUBLINGUAL
  Filled 2021-12-13: qty 1

## 2021-12-13 MED ORDER — ACETAMINOPHEN 325 MG PO TABS: 650.0000 mg | ORAL_TABLET | Freq: Four times a day (QID) | ORAL | Status: DC | PRN

## 2021-12-13 MED ORDER — MELATONIN 5 MG PO TABS
5.0000 mg | ORAL_TABLET | Freq: Every evening | ORAL | Status: DC | PRN
  Administered 2021-12-13: 5 mg via ORAL
  Filled 2021-12-13: qty 1

## 2021-12-13 MED ORDER — HYDROMORPHONE HCL 1 MG/ML IJ SOLN: 0.5000 mg | INTRAMUSCULAR | Status: DC | PRN

## 2021-12-13 MED ORDER — HEPARIN BOLUS VIA INFUSION
4000.0000 [IU] | Freq: Once | INTRAVENOUS | Status: AC
  Administered 2021-12-13: 4000 [IU] via INTRAVENOUS

## 2021-12-13 MED ORDER — POLYETHYLENE GLYCOL 3350 17 G PO PACK: 17.0000 g | PACK | Freq: Every day | ORAL | Status: DC | PRN

## 2021-12-13 MED ORDER — ASPIRIN 81 MG PO CHEW
324.0000 mg | CHEWABLE_TABLET | Freq: Once | ORAL | Status: AC
Start: 2021-12-13 — End: 2021-12-13
  Administered 2021-12-13: 324 mg via ORAL
  Filled 2021-12-13: qty 4

## 2021-12-13 MED ORDER — HEPARIN (PORCINE) 25000 UT/250ML-% IV SOLN
1100.0000 [IU]/h | INTRAVENOUS | Status: DC
  Administered 2021-12-13: 1100 [IU]/h via INTRAVENOUS
  Filled 2021-12-13: qty 250

## 2021-12-13 MED ORDER — ALUM & MAG HYDROXIDE-SIMETH 200-200-20 MG/5ML PO SUSP
30.0000 mL | Freq: Once | ORAL | Status: AC
  Administered 2021-12-13: 30 mL via ORAL
  Filled 2021-12-13: qty 30

## 2021-12-13 MED ORDER — INSULIN ASPART 100 UNIT/ML IJ SOLN
0.0000 [IU] | Freq: Three times a day (TID) | INTRAMUSCULAR | Status: DC
  Administered 2021-12-14: 3 [IU] via SUBCUTANEOUS

## 2021-12-13 MED ORDER — OXYCODONE HCL 5 MG PO TABS
5.0000 mg | ORAL_TABLET | Freq: Four times a day (QID) | ORAL | Status: DC | PRN
  Administered 2021-12-13: 5 mg via ORAL
  Filled 2021-12-13: qty 1

## 2021-12-13 MED ORDER — ATORVASTATIN CALCIUM 40 MG PO TABS
40.0000 mg | ORAL_TABLET | Freq: Every day | ORAL | Status: DC
  Administered 2021-12-14: 40 mg via ORAL
  Filled 2021-12-13: qty 1

## 2021-12-13 MED ORDER — ONDANSETRON HCL 4 MG/2ML IJ SOLN: 4.0000 mg | Freq: Four times a day (QID) | INTRAMUSCULAR | Status: DC | PRN

## 2021-12-13 MED ORDER — ASPIRIN 81 MG PO TBEC
81.0000 mg | DELAYED_RELEASE_TABLET | Freq: Every day | ORAL | Status: DC
  Administered 2021-12-14: 81 mg via ORAL
  Filled 2021-12-13: qty 1

## 2021-12-13 MED ORDER — INSULIN ASPART 100 UNIT/ML IJ SOLN: 0.0000 [IU] | Freq: Every day | INTRAMUSCULAR | Status: DC

## 2021-12-13 NOTE — ED Provider Notes (Signed)
[facility] EMERGENCY DEPT Provider Note   CSN: [id number] Arrival date & time: 12/13/21  1302     History  Chief Complaint  Patient presents with   Chest Pain    [name] is a 71 y.o. female.  HPI  HPI: A 71 year old patient with a history of treated diabetes, hypercholesterolemia and obesity presents for evaluation of chest pain. Initial onset of pain was approximately 1-3 hours ago. The patient's chest pain is described as heaviness/pressure/tightness and is not worse with exertion. The patient complains of nausea and reports some diaphoresis. The patient's chest pain is middle- or left-sided, is not well-localized, is not sharp and does radiate to the arms/jaw/neck. The patient has no history of stroke, has no history of peripheral artery disease, has not smoked in the past 90 days, has no relevant family history of coronary artery disease (first degree relative at less than age 13) and is not hypertensive.   Home Medications Prior to Admission medications   Medication Sig Start Date End Date Taking? Authorizing Provider  ALPRAZolam Duanne Moron) 0.5 MG tablet Take 0.5 mg by mouth at bedtime as needed for anxiety or sleep.    [provider]  atorvastatin (LIPITOR) 20 MG tablet Take 20 mg by mouth daily.    [provider]  colestipol (COLESTID) 1 g tablet Take 2 g by mouth 2 (two) times daily.    [provider]  diphenhydrAMINE (BENADRYL) 25 MG tablet Take 25 mg by mouth daily.    [provider]  HYDROcodone-acetaminophen (NORCO/VICODIN) 5-325 MG tablet Take 1 tablet by mouth every 4 (four) hours as needed for moderate pain ((score 4 to 6)). 11/07/20   Phylliss Bob, MD  methocarbamol (ROBAXIN) 500 MG tablet Take 1 tablet (500 mg total) by mouth every 6 (six) hours as needed for muscle spasms. 11/07/20   Phylliss Bob, MD  metoprolol (TOPROL-XL) 100 MG 24 hr tablet Take 50 mg by mouth at bedtime.    [provider]   Multiple Vitamins-Minerals (MULTIVITAMIN WITH MINERALS) tablet Take 1 tablet by mouth daily.    [provider]  Probiotic Product (PRO-BIOTIC BLEND PO) Take 1 capsule by mouth daily.     [provider]  propranolol (INDERAL) 10 MG tablet Take 10 mg by mouth daily as needed. For svt episodes 03/27/11   Nahser, Wonda Cheng, MD  rizatriptan (MAXALT) 10 MG tablet Take 10 mg by mouth daily as needed. May repeat in 2 hours if needed. For migraines    [provider]  sertraline (ZOLOFT) 100 MG tablet Take 200 mg by mouth daily.    [provider]  traMADol (ULTRAM) 50 MG tablet Take 50 mg by mouth 2 (two) times daily as needed for severe pain. 01/25/18   [provider]  zolpidem (AMBIEN) 10 MG tablet Take 10 mg by mouth at bedtime.    [provider]      Allergies    Amitriptyline, Amoxicillin, Biaxin [clarithromycin], Codeine, Crestor [rosuvastatin calcium], Doxycycline, Escitalopram, Gabapentin, Pantoprazole, Penicillins, Pravachol, Ranitidine hcl, Spironolactone, and Topamax [topiramate]    Review of Systems   Review of Systems  All other systems reviewed and are negative.  Physical Exam Updated Vital Signs BP (!) 143/75   Pulse 76   Temp 98.8 F (37.1 C)   Resp 16   Ht '5\' 6"'$  (1.676 m)   Wt 104.3 kg   SpO2 97%   BMI 37.12 kg/m  Physical Exam Vitals and nursing note reviewed.  Constitutional:  Appearance: She is well-developed.  HENT:     Head: Atraumatic.  Cardiovascular:     Rate and Rhythm: Normal rate.  Pulmonary:     Effort: Pulmonary effort is normal.  Musculoskeletal:     Cervical back: Normal range of motion and neck supple.  Skin:    General: Skin is warm and dry.  Neurological:     Mental Status: She is alert and oriented to person, place, and time.    ED Results / Procedures / Treatments   Labs (all labs ordered are listed, but only abnormal results are displayed) Labs Reviewed  BASIC METABOLIC PANEL -  Abnormal; Notable for the following components:      Result Value   Glucose, Bld 164 (*)    BUN 25 (*)    All other components within normal limits  CBC - Abnormal; Notable for the following components:   WBC 11.2 (*)    Hemoglobin 15.7 (*)    All other components within normal limits  TROPONIN I (HIGH SENSITIVITY)  TROPONIN I (HIGH SENSITIVITY)    EKG EKG Interpretation  Date/Time:  Saturday December 13 2021 13:08:59 EDT Ventricular Rate:  57 PR Interval:  162 QRS Duration: 128 QT Interval:  440 QTC Calculation: 428 R Axis:   -8 Text Interpretation: Sinus bradycardia Right bundle branch block Abnormal ECG When compared with ECG of 10-Mar-2016 14:13, Right bundle branch block is now Present rbbb is new Confirmed by Varney Biles [phone]) on 12/13/2021 2:07:51 PM  Radiology DG Chest Port 1 View  Result Date: 12/13/2021 CLINICAL DATA:  Chest pain EXAM: PORTABLE CHEST 1 VIEW COMPARISON:  03/10/2016 FINDINGS: The heart size and mediastinal contours are within normal limits. No focal airspace consolidation, pleural effusion, or pneumothorax. The visualized skeletal structures are unremarkable. IMPRESSION: No active disease. Electronically Signed   By: Davina Poke D.O.   On: 12/13/2021 14:02    Procedures .Critical Care Performed by: Varney Biles, MD Authorized by: Varney Biles, MD   Critical care provider statement:    Critical care time (minutes):  30   Critical care was necessary to treat or prevent imminent or life-threatening deterioration of the following conditions:  Circulatory failure   Critical care was time spent personally by me on the following activities:  Development of treatment plan with patient or surrogate, discussions with consultants, evaluation of patient's response to treatment, examination of patient, ordering and review of laboratory studies, ordering and review of radiographic studies, ordering and performing treatments and interventions, pulse oximetry,  re-evaluation of patient's condition and review of old charts    Medications Ordered in ED Medications  nitroGLYCERIN (NITROSTAT) SL tablet 0.4 mg (0.4 mg Sublingual Given 12/13/21 1446)  alum & mag hydroxide-simeth (MAALOX/MYLANTA) 200-200-20 MG/5ML suspension 30 mL (30 mLs Oral Given 12/13/21 1445)  aspirin chewable tablet 324 mg (324 mg Oral Given 12/13/21 1445)    ED Course/ Medical Decision Making/ A&P Clinical Course as of 12/13/21 1536  Sat Dec 13, 2021  1523 Patient reassessed.  Continues to have chest pain, going through to her back.  Initial troponin, initial EKG did not show any signs of ischemia.  Pain is atypical, but there is no musculoskeletal component, does not appear to be GERD/GI.  Her hear score is 7.  We will request admission to the hospital. [AN]  1536 Will initiate heparin. [AN]    Clinical Course User Index [AN] Varney Biles, MD   HEAR Score: 7  Medical Decision Making Amount and/or Complexity of Data Reviewed Labs: ordered. Radiology: ordered.  Risk OTC drugs. Prescription drug management. Decision regarding hospitalization.   This patient presents to the ED with chief complaint(s) of chest pain radiating to her back and some abdominal discomfort with pertinent past medical history of hypertension, hyperlipidemia, BMI over 30, GERD which further complicates the presenting complaint. The complaint involves an extensive differential diagnosis and also carries with it a high risk of complications and morbidity.    The differential diagnosis includes  ACS syndrome Aortic dissection CHF exacerbation Valvular disorder Myocarditis Pericarditis Endocarditis Pericardial effusion / tamponade Pneumonia Pleural effusion / Pulmonary edema PE Pneumothorax Musculoskeletal pain PUD / Gastritis / Esophagitis Esophageal spasm   Symptoms are not typical for ACS, but there is no direct association of this pain with GERD/esophageal spasm,  musculoskeletal etiology or PE.  The initial plan is to order basic blood work-up including troponin, EKG, chest x-ray and reassess.   Additional history obtained: Records reviewed Primary Care Documents and also reviewed outpatient cardiology visit.  Independent labs interpretation:  The following labs were independently interpreted: Initial high-sensitivity troponin is below institutional cutoff for myocardial injury. Since white count and hemoglobin are normal.  Independent visualization of imaging: - I independently visualized the following imaging with scope of interpretation limited to determining acute life threatening conditions related to emergency care: X-ray of the chest, which revealed no evidence of pneumothorax.  Treatment and Reassessment: Patient was given GI cocktail and nitroglycerin.  She was reassessed.  She indicates that there is no change in her pain.  Patient's hear score is 7.  We will not be able to secure a close follow-up with cardiology on Saturday.  We will proceed with request for admission for ACS rule out.  We will start heparin drip for questionable unstable angina.  Final Clinical Impression(s) / ED Diagnoses Final diagnoses:  Precordial chest pain  Angina at rest [facility])    Rx / DC Orders ED Discharge Orders     None         Varney Biles, MD 12/13/21 1536

## 2021-12-13 NOTE — Consult Note (Signed)
Cardiology Consultation:   Patient ID: [name] MRN: [id number]; DOB: 07-Jul-1951  Admit date: 12/13/2021 Date of Consult: 12/13/2021  Primary Care Provider: Shirline Frees, MD [facility] Cardiologist: Rachael Moores, MD  [place] Electrophysiologist:  None   Patient Profile:   [name] is a 71 y.o. female with HTN, SVT, HLD, MDD, DM2 diet controlled, and UC who is being seen today for the evaluation of chest pain at the request of [name].  History of Present Illness:   [name] reports that she was feeling fine yesterday (12/13/21) until she sat down to eat lunch around 1130-12 PM and started have a mild amount of chest discomfort over her left chest with some radiation to her back.  She had a few bites of food which was followed by cute onset of abdominal discomfort and nausea.  She had associated diaphoresis and chills which scared her.  She reports her's chest discomfort was around 8/10 severity and lasted for several hours until she arrived to the emergency department.  Neither GI cocktail or sublingual nitroglycerin affected and symptoms at all.  Her chest discomfort eventually slowly resolved on its own after several additional hours of evaluation.  Mild amount of chest pressure persisted and eventually went away and was replaced with "chest soreness."   She does not have known CAD and is followed by cardiology for SVT management.  She does have risk factors however for CAD including hypertension, hyperlipidemia, diabetes and ulcerative colitis which is well controlled.  She has never had similar symptoms like this before.  VS during my evaluation: P 75, BP 159/84, RR 18, 93%/RA  She was conversant and was not in significant pain during our discussion.  She denied any significant family history of premature CAD although her mother passed away presumed myocarditis at age 54.  Past Medical History:  Diagnosis Date   Anxiety    Arthritis    Depression     DM (diabetes mellitus) ([place])    Diet controlled   Dysrhythmia    SVT   Goiter    neg biopsy   Hyperlipidemia    Hypertension    Migraine headache    PONV (postoperative nausea and vomiting)    SVT (supraventricular tachycardia) (H[facility])    Ulcerative colitis ([place])    Past Surgical History:  Procedure Laterality Date   ABDOMINAL HYSTERECTOMY     ACHILLES TENDON REPAIR     APPENDECTOMY     CARDIOVASCULAR STRESS TEST  11/05/2003   EF 68%   CHOLECYSTECTOMY     DOPPLER ECHOCARDIOGRAPHY  02/11/2000   EF 60-65%   FOOT SURGERY     JOINT REPLACEMENT     NOSE SURGERY     TONSILLECTOMY     TOTAL KNEE ARTHROPLASTY     right knee   TRANSFORAMINAL LUMBAR INTERBODY FUSION (TLIF) WITH PEDICLE SCREW FIXATION 2 LEVEL Right 11/06/2020   Procedure: RIGHT-SIDED LUMBAR 3-4, LUMBAR 4-5 TRANSFORAMINAL LUMBAR INTERBODY FUSION WITH INSTRUMENTATION AND ALLOGRAFT;  Surgeon: Phylliss Bob, MD;  Location: [facility];  Service: Orthopedics;  Laterality: Right;    Home Medications:  Prior to Admission medications   Medication Sig Start Date End Date Taking? Authorizing Provider  ALPRAZolam Duanne Moron) 0.5 MG tablet Take 0.5 mg by mouth at bedtime as needed for anxiety or sleep.   Yes [provider]  atorvastatin (LIPITOR) 20 MG tablet Take 20 mg by mouth daily.   Yes [provider]  colestipol (COLESTID) 1 g tablet Take 2 g  by mouth 2 (two) times daily.   Yes [provider]  diphenhydrAMINE (BENADRYL) 25 MG tablet Take 25 mg by mouth every evening.   Yes [provider]  metoprolol (TOPROL-XL) 100 MG 24 hr tablet Take 50 mg by mouth at bedtime.   Yes [provider]  Multiple Vitamins-Minerals (MULTIVITAMIN WITH MINERALS) tablet Take 1 tablet by mouth daily.   Yes [provider]  Probiotic Product (PRO-BIOTIC BLEND PO) Take 1 capsule by mouth daily.    Yes [provider]  propranolol (INDERAL) 10 MG tablet Take 10 mg by mouth daily as needed (For SVT  episodes). 03/27/11  Yes Nahser, Wonda Cheng, MD  rizatriptan (MAXALT) 10 MG tablet Take 10 mg by mouth daily as needed for migraine. May repeat in 2 hours if needed. For migraines   Yes [provider]  sertraline (ZOLOFT) 100 MG tablet Take 200 mg by mouth daily.   Yes [provider]  traMADol (ULTRAM) 50 MG tablet Take 50 mg by mouth 2 (two) times daily as needed for severe pain. 01/25/18  Yes [provider]  zolpidem (AMBIEN) 10 MG tablet Take 10 mg by mouth at bedtime.   Yes [provider]  HYDROcodone-acetaminophen (NORCO/VICODIN) 5-325 MG tablet Take 1 tablet by mouth every 4 (four) hours as needed for moderate pain ((score 4 to 6)). Patient not taking: Reported on 12/13/2021 11/07/20   Phylliss Bob, MD  methocarbamol (ROBAXIN) 500 MG tablet Take 1 tablet (500 mg total) by mouth every 6 (six) hours as needed for muscle spasms. Patient not taking: Reported on 12/13/2021 11/07/20   Phylliss Bob, MD   Inpatient Medications: Scheduled Meds:  [START ON 12/14/2021] aspirin EC  81 mg Oral Daily   [START ON 12/14/2021] atorvastatin  40 mg Oral Daily   [START ON 12/14/2021] insulin aspart  0-20 Units Subcutaneous TID WC   insulin aspart  0-5 Units Subcutaneous QHS   Continuous Infusions:  heparin 1,100 Units/hr (12/13/21 1639)   PRN Meds: acetaminophen, HYDROmorphone (DILAUDID) injection, melatonin, nitroGLYCERIN, ondansetron (ZOFRAN) IV, oxyCODONE, polyethylene glycol  Allergies:    Allergies  Allergen Reactions   Amitriptyline     Just makes me feel sick    Amoxicillin     REACTION: Hives   Biaxin [Clarithromycin]     REACTION: Hives   Codeine     Feel sick    Crestor [Rosuvastatin Calcium]     ARM PAIN   Doxycycline     REACTION: Hives   Escitalopram     unknown   Gabapentin Diarrhea   Pantoprazole     unknown   Penicillins     REACTION: Hives   Pravachol     ARM PAIN   Ranitidine Hcl     unknown   Spironolactone     unknown   Topamax  [Topiramate] Anxiety    Social History:   Social History   Socioeconomic History   Marital status: Married    Spouse name: Not on file   Number of children: 3   Years of education: Not on file   Highest education level: Not on file  Occupational History   Occupation: Aeronautical engineer: [employer]  Tobacco Use   Smoking status: Never   Smokeless tobacco: Never  Vaping Use   Vaping Use: Never used  Substance and Sexual Activity   Alcohol use: Yes    Comment: Rare   Drug use: No   Sexual activity: Not on file  Other Topics Concern   Not on file  Social History Narrative   Not on file   Social Determinants of Health   Financial Resource Strain: Not on file  Food Insecurity: Not on file  Transportation Needs: Not on file  Physical Activity: Not on file  Stress: Not on file  Social Connections: Not on file  Intimate Partner Violence: Not on file    Family History:    Family History  Problem Relation Age of Onset   Myocarditis Mother    Aneurysm Father    Breast cancer Paternal Aunt     ROS:  Review of Systems: [y] = yes, '[ ]'$  = no      General: Weight gain '[ ]'$ ; Weight loss '[ ]'$ ; Anorexia '[ ]'$ ; Fatigue '[ ]'$ ; Fever '[ ]'$ ; Chills '[ ]'$ ; Weakness '[ ]'$    Cardiac: Chest pain/pressure [y]; Resting SOB '[ ]'$ ; Exertional SOB '[ ]'$ ; Orthopnea '[ ]'$ ; Pedal Edema '[ ]'$ ; Palpitations '[ ]'$ ; Syncope '[ ]'$ ; Presyncope '[ ]'$ ; Paroxysmal nocturnal dyspnea '[ ]'$    Pulmonary: Cough '[ ]'$ ; Wheezing '[ ]'$ ; Hemoptysis '[ ]'$ ; Sputum '[ ]'$ ; Snoring '[ ]'$    GI: Vomiting '[ ]'$ ; Dysphagia '[ ]'$ ; Melena '[ ]'$ ; Hematochezia '[ ]'$ ; Heartburn '[ ]'$ ; Abdominal pain '[ ]'$ ; Constipation '[ ]'$ ; Diarrhea '[ ]'$ ; BRBPR '[ ]'$    GU: Hematuria '[ ]'$ ; Dysuria '[ ]'$ ; Nocturia '[ ]'$  Vascular: Pain in legs with walking '[ ]'$ ; Pain in feet with lying flat '[ ]'$ ; Non-healing sores '[ ]'$ ; Stroke '[ ]'$ ; TIA '[ ]'$ ; Slurred speech '[ ]'$ ;   Neuro: Headaches '[ ]'$ ; Vertigo '[ ]'$ ; Seizures '[ ]'$ ; Paresthesias '[ ]'$ ;Blurred vision '[ ]'$ ; Diplopia '[ ]'$ ; Vision changes '[ ]'$    Ortho/Skin:  Arthritis '[ ]'$ ; Joint pain '[ ]'$ ; Muscle pain '[ ]'$ ; Joint swelling '[ ]'$ ; Back Pain '[ ]'$ ; Rash '[ ]'$    Psych: Depression '[ ]'$ ; Anxiety '[ ]'$    Heme: Bleeding problems '[ ]'$ ; Clotting disorders '[ ]'$ ; Anemia '[ ]'$    Endocrine: Diabetes '[ ]'$ ; Thyroid dysfunction '[ ]'$    Physical Exam/Data:   Vitals:   12/13/21 1730 12/13/21 1800 12/13/21 1830 12/13/21 1948  BP: 101/74 121/86 129/90 (!) 154/85  Pulse: 64 77 76 71  Resp: 16 (!) 21 (!) 21 (!) 23  Temp:    98.3 F (36.8 C)  TempSrc:    Oral  SpO2: 96% 96% 97% 99%  Weight:    105.2 kg  Height:    '5\' 6"'$  (1.676 m)    Intake/Output Summary (Last 24 hours) at 12/13/2021 2320 Last data filed at 12/13/2021 2200 Gross per 24 hour  Intake 97.42 ml  Output 300 ml  Net -202.58 ml      12/13/2021    7:48 PM 12/13/2021    1:07 PM 11/04/2020   10:17 AM  Last 3 Weights  Weight (lbs) 231 lb 14.4 oz 230 lb 227 lb 5 oz  Weight (kg) 105.189 kg 104.327 kg 103.108 kg     Body mass index is 37.43 kg/m.  General:  Well nourished, well developed, in no acute distress HEENT: normal Lymph: no adenopathy Neck: no JVD Endocrine:  No thryomegaly Vascular: No carotid bruits; FA pulses 2+ bilaterally without bruits  Cardiac:  normal S1, S2; RRR; no murmur  Lungs:  clear to auscultation bilaterally, no wheezing, rhonchi or rales  Abd: soft, nontender, no hepatomegaly  Ext: no edema Musculoskeletal:  No deformities, BUE and BLE strength normal and equal Skin: warm and dry  Neuro:  CNs 2-12 intact, no focal abnormalities noted Psych:  Normal affect   EKG:  The EKG was personally reviewed (12/13/21, 13:08:59) and demonstrates: sinus bradycardia (ventricular rate 57 bpm), PR 162 ms, QRS 128 ms, Qtc 428 ms, RBBB without ischemic changes  Telemetry:  Telemetry was personally reviewed and demonstrates: NSR  Relevant CV Studies: None recent   Laboratory Data:  High Sensitivity Troponin:   Recent Labs  Lab 12/13/21 1326 12/13/21 1604  TROPONINIHS 6 7     Chemistry Recent  Labs  Lab 12/13/21 1326  NA 137  K 3.8  CL 103  CO2 23  GLUCOSE 164*  BUN 25*  CREATININE 0.99  CALCIUM 10.1  GFRNONAA >60  ANIONGAP 11    No results for input(s): PROT, ALBUMIN, AST, ALT, ALKPHOS, BILITOT in the last 168 hours. Hematology Recent Labs  Lab 12/13/21 1326  WBC 11.2*  RBC 5.04  HGB 15.7*  HCT 45.7  MCV 90.7  MCH 31.2  MCHC 34.4  RDW 13.0  PLT 287   BNPNo results for input(s): BNP, PROBNP in the last 168 hours.  DDimer No results for input(s): DDIMER in the last 168 hours.  Radiology/Studies:  DG Chest Port 1 View  Result Date: 12/13/2021 CLINICAL DATA:  Chest pain EXAM: PORTABLE CHEST 1 VIEW COMPARISON:  03/10/2016 FINDINGS: The heart size and mediastinal contours are within normal limits. No focal airspace consolidation, pleural effusion, or pneumothorax. The visualized skeletal structures are unremarkable. IMPRESSION: No active disease. Electronically Signed   By: Davina Poke D.O.   On: 12/13/2021 14:02    HEAR Score (for undifferentiated chest pain): 7  Assessment and Plan:   Chest pain HTN HLD UC Patient presents with symptoms of possible cardiac chest pain.  Her symptoms were not affected with exertion and relieved by nitroglycerin however she does have significant risk factors as above including relatively well-controlled hypertension and ulcerative colitis in remission.  LDL cholesterol and total cholesterol are not well controlled.  She is in sinus rhythm with heart rate in the 70s prior to her Toprol-XL dose (50 mg qhs however missed 06/03 PM dose so ordered additional dose for this morning in anticipation of possible cCT.  I am not sure whether this is true unstable angina without any preceding history leading up to this event or whether this is more consistent with noncardiac chest pain.  Either way she is oriented and started on ACS protocol so I would favor coronary CT to rule out significant obstructive disease is recommended for keeping  her over the weekend for coronary angiography on Monday.  Increased atorvastatin to 40 mg daily from 20 mg daily.   For questions or updates, please contact [place] Please consult [email or web address] for contact info under   Signed, Dion Body, MD  12/13/2021 11:20 PM

## 2021-12-13 NOTE — ED Triage Notes (Signed)
Pt reports sudden onset of tight chest pain that radiates to the back. Pt report she has associated abd pain and chills.

## 2021-12-13 NOTE — ED Notes (Signed)
Report to [facility] and Surveyor, [employer].

## 2021-12-13 NOTE — H&P (Addendum)
History and Physical  [name] NUU:[id number] DOB: 19-Oct-1950 DOA: 12/13/2021  Referring physician: Accepted by Dr. Lorin Mercy [facility], hospitalist service. PCP: Shirline Frees, MD  Outpatient Specialists: Cardiology, neurosurgery. Patient coming from: Home.  Chief Complaint: Chest pain.  HPI: [name] is a 71 y.o. female with medical history significant for obesity, hypertension, supraventricular tachycardia, hyperlipidemia, chronic back pain status post back surgery a year ago, who initially presented to [facility] ED with complaints of constant centrally located chest pain, with onset 1 to 3 hours prior to presentation, during lunchtime, while she was making a sandwich.  Described as heaviness pressure-like radiating to her back.  Not worsened by exertion.  Improved with leaning forward.  Associated with nausea and diaphoresis.    Upon presentation to the ED, vital signs are stable.  First set of high-sensitivity troponin is negative.  In the ED she received a GI cocktail x1 and nitroglycerin x1 without improvement of her symptomatology.  With a heart score of 7, EDP requested admission.  The patient was accepted by Dr. Lorin Mercy, [facility], hospitalist service as a direct transfer to [facility] telemetry cardiac unit, as observation status.  In the ED, prior to transfer, the patient was started on heparin drip for suspected unstable angina.  At the time of the visit, although the patient's chest pain is improved, down to 3-4/10, it persists while on heparin drip.  ED Course: Tmax 98.8.  BP 129/90, pulse 76, respiratory 21, oxygen saturation 97% on room air.  Lab studies remarkable for serum glucose 164.  BUN 25, creatinine 0.99.WBC 11.2.  Hemoglobin 15.7.  High-sensitivity troponin negative x2.  Review of Systems: Review of systems as noted in the HPI. All other systems reviewed and are negative.   Past Medical History:  Diagnosis Date   Anxiety    Arthritis    Depression    DM (diabetes  mellitus) ([place])    Diet controlled   Dysrhythmia    SVT   Goiter    neg biopsy   Hyperlipidemia    Hypertension    Migraine headache    PONV (postoperative nausea and vomiting)    SVT (supraventricular tachycardia) (HCC)    Ulcerative colitis ([place])    Past Surgical History:  Procedure Laterality Date   ABDOMINAL HYSTERECTOMY     ACHILLES TENDON REPAIR     APPENDECTOMY     CARDIOVASCULAR STRESS TEST  11/05/2003   EF 68%   CHOLECYSTECTOMY     DOPPLER ECHOCARDIOGRAPHY  02/11/2000   EF 60-65%   FOOT SURGERY     JOINT REPLACEMENT     NOSE SURGERY     TONSILLECTOMY     TOTAL KNEE ARTHROPLASTY     right knee   TRANSFORAMINAL LUMBAR INTERBODY FUSION (TLIF) WITH PEDICLE SCREW FIXATION 2 LEVEL Right 11/06/2020   Procedure: RIGHT-SIDED LUMBAR 3-4, LUMBAR 4-5 TRANSFORAMINAL LUMBAR INTERBODY FUSION WITH INSTRUMENTATION AND ALLOGRAFT;  Surgeon: Phylliss Bob, MD;  Location: [place];  Service: Orthopedics;  Laterality: Right;    Social History:  reports that she has never smoked. She has never used smokeless tobacco. She reports current alcohol use. She reports that she does not use drugs.   Allergies  Allergen Reactions   Amitriptyline    Amoxicillin     REACTION: Hives   Biaxin [Clarithromycin]     REACTION: Hives   Codeine    Crestor [Rosuvastatin Calcium]     ARM PAIN   Doxycycline     REACTION: Hives   Escitalopram  Gabapentin Diarrhea   Pantoprazole    Penicillins     REACTION: Hives   Pravachol     ARM PAIN   Ranitidine Hcl    Spironolactone    Topamax [Topiramate] Anxiety    Family History  Problem Relation Age of Onset   Myocarditis Mother    Aneurysm Father    Breast cancer Paternal Aunt       Prior to Admission medications   Medication Sig Start Date End Date Taking? Authorizing Provider  ALPRAZolam Duanne Moron) 0.5 MG tablet Take 0.5 mg by mouth at bedtime as needed for anxiety or sleep.    [provider]  atorvastatin (LIPITOR) 20 MG tablet Take  20 mg by mouth daily.    [provider]  colestipol (COLESTID) 1 g tablet Take 2 g by mouth 2 (two) times daily.    [provider]  diphenhydrAMINE (BENADRYL) 25 MG tablet Take 25 mg by mouth daily.    [provider]  HYDROcodone-acetaminophen (NORCO/VICODIN) 5-325 MG tablet Take 1 tablet by mouth every 4 (four) hours as needed for moderate pain ((score 4 to 6)). 11/07/20   Phylliss Bob, MD  methocarbamol (ROBAXIN) 500 MG tablet Take 1 tablet (500 mg total) by mouth every 6 (six) hours as needed for muscle spasms. 11/07/20   Phylliss Bob, MD  metoprolol (TOPROL-XL) 100 MG 24 hr tablet Take 50 mg by mouth at bedtime.    [provider]  Multiple Vitamins-Minerals (MULTIVITAMIN WITH MINERALS) tablet Take 1 tablet by mouth daily.    [provider]  Probiotic Product (PRO-BIOTIC BLEND PO) Take 1 capsule by mouth daily.     [provider]  propranolol (INDERAL) 10 MG tablet Take 10 mg by mouth daily as needed. For svt episodes 03/27/11   Nahser, Wonda Cheng, MD  rizatriptan (MAXALT) 10 MG tablet Take 10 mg by mouth daily as needed. May repeat in 2 hours if needed. For migraines    [provider]  sertraline (ZOLOFT) 100 MG tablet Take 200 mg by mouth daily.    [provider]  traMADol (ULTRAM) 50 MG tablet Take 50 mg by mouth 2 (two) times daily as needed for severe pain. 01/25/18   [provider]  zolpidem (AMBIEN) 10 MG tablet Take 10 mg by mouth at bedtime.    [provider]    Physical Exam: BP 129/90   Pulse 76   Temp 98.8 F (37.1 C)   Resp (!) 21   Ht '5\' 6"'$  (1.676 m)   Wt 104.3 kg   SpO2 97%   BMI 37.12 kg/m   General: 71 y.o. year-old female well developed well nourished in no acute distress.  Alert and oriented x3. Cardiovascular: Regular rate and rhythm with no rubs or gallops.  No thyromegaly or JVD noted.  No lower extremity edema. 2/4 pulses in all 4 extremities. Respiratory: Clear to  auscultation with no wheezes or rales. Good inspiratory effort. Abdomen: Soft nontender nondistended with normal bowel sounds x4 quadrants. Muskuloskeletal: No cyanosis, clubbing or edema noted bilaterally Neuro: CN II-XII intact, strength, sensation, reflexes Skin: No ulcerative lesions noted or rashes Psychiatry: Judgement and insight appear normal. Mood is appropriate for condition and setting          Labs on Admission:  Basic Metabolic Panel: Recent Labs  Lab 12/13/21 1326  NA 137  K 3.8  CL 103  CO2 23  GLUCOSE 164*  BUN 25*  CREATININE 0.99  CALCIUM 10.1  Liver Function Tests: No results for input(s): AST, ALT, ALKPHOS, BILITOT, PROT, ALBUMIN in the last 168 hours. No results for input(s): LIPASE, AMYLASE in the last 168 hours. No results for input(s): AMMONIA in the last 168 hours. CBC: Recent Labs  Lab 12/13/21 1326  WBC 11.2*  HGB 15.7*  HCT 45.7  MCV 90.7  PLT 287   Cardiac Enzymes: No results for input(s): CKTOTAL, CKMB, CKMBINDEX, TROPONINI in the last 168 hours.  BNP (last 3 results) No results for input(s): BNP in the last 8760 hours.  ProBNP (last 3 results) No results for input(s): PROBNP in the last 8760 hours.  CBG: Recent Labs  Lab 12/13/21 1946  GLUCAP 179*    Radiological Exams on Admission: DG Chest Port 1 View  Result Date: 12/13/2021 CLINICAL DATA:  Chest pain EXAM: PORTABLE CHEST 1 VIEW COMPARISON:  03/10/2016 FINDINGS: The heart size and mediastinal contours are within normal limits. No focal airspace consolidation, pleural effusion, or pneumothorax. The visualized skeletal structures are unremarkable. IMPRESSION: No active disease. Electronically Signed   By: Davina Poke D.O.   On: 12/13/2021 14:02    EKG: I independently viewed the EKG done and my findings are as followed: Normal sinus rhythm rate of 67.  Nonspecific ST-T changes.  QTc 458.  Assessment/Plan Present on Admission:  Chest pain of uncertain  etiology  Principal Problem:   Chest pain of uncertain etiology  Atypical chest pain, rule out ACS Centrally located chest, heaviness pressure like, lasting hours not worsened with activity.  Improved with leaning forward.  No evidence of acute pericarditis on 12 lead EKG, personally reviewed. First 2 sets of high-sensitivity troponin negative. She was started on heparin drip in the ED prior to transfer to [facility] Obtain fasting lipid panel and A1c in the morning for risk stratification Obtain 2D echo Repeat 12-lead EKG Start low-dose aspirin 81 mg daily and Lipitor 40 mg daily, with previous history of hyperlipidemia. Cardiology consulted, Dr. Renella Cunas. Analgesics as needed with bowel regimen as needed  Hyperglycemia No prior history of diabetes Serum glucose 164. Obtain hemoglobin A1c Start insulin sliding scale. Start heart healthy/carb modified diet, n.p.o. after midnight until seen by cardiology  Hyperlipidemia Lipitor, home dose increased to 40 mg daily  History of supraventricular tachycardia Resume home regimen  Chronic anxiety/depression Resume home regimen  Obesity BMI 37 Recommend weight loss outpatient with regular physical activity and healthy dieting.   DVT prophylaxis: Heparin drip  Code Status: Full code  Family Communication: None at bedside  Disposition Plan: Admitted to telemetry cardiac unit by Dr. Lorin Mercy.  Consults called: Cardiology  Admission status: Observation status.   Status is: Observation    Kayleen Memos MD [facility] Pager [phone]  If 7PM-7AM, please contact night-coverage [email or web address] Password [facility]  12/13/2021, 7:54 PM

## 2021-12-13 NOTE — Progress Notes (Signed)
ANTICOAGULATION CONSULT NOTE - Initial Consult  Pharmacy Consult for Heparin Indication: chest pain/ACS  Allergies  Allergen Reactions   Amitriptyline    Amoxicillin     REACTION: Hives   Biaxin [Clarithromycin]     REACTION: Hives   Codeine    Crestor [Rosuvastatin Calcium]     ARM PAIN   Doxycycline     REACTION: Hives   Escitalopram    Gabapentin Diarrhea   Pantoprazole    Penicillins     REACTION: Hives   Pravachol     ARM PAIN   Ranitidine Hcl    Spironolactone    Topamax [Topiramate] Anxiety    Patient Measurements: Height: '5\' 6"'$  (167.6 cm) Weight: 104.3 kg (230 lb) IBW/kg (Calculated) : 59.3 Heparin Dosing Weight: 83.2 kg  Vital Signs: Temp: 98.8 F (37.1 C) (06/03 1311) BP: 143/75 (06/03 1500) Pulse Rate: 76 (06/03 1500)  Labs: Recent Labs    12/13/21 1326  HGB 15.7*  HCT 45.7  PLT 287  CREATININE 0.99  TROPONINIHS 6    Estimated Creatinine Clearance: 65.4 mL/min (by C-G formula based on SCr of 0.99 mg/dL).   Medical History: Past Medical History:  Diagnosis Date   Anxiety    Arthritis    Depression    DM (diabetes mellitus) ([place])    Diet controlled   Dysrhythmia    SVT   Goiter    neg biopsy   Hyperlipidemia    Hypertension    Migraine headache    PONV (postoperative nausea and vomiting)    SVT (supraventricular tachycardia) (HCC)    Ulcerative colitis (HCC)     Medications:  (Not in a hospital admission)  Scheduled:  Infusions:  PRN: nitroGLYCERIN  Assessment: 34 yof with a history of DM, HTN, HLD, OSA, class 2 obesity, and UC. Patient is presenting with chest pain. Heparin per pharmacy consult placed for chest pain/ACS.  Patient is not on anticoagulation prior to arrival.  Hgb 15.7; plt 287  Goal of Therapy:  Heparin level 0.3-0.7 units/ml Monitor platelets by anticoagulation protocol: Yes   Plan:  Give 4000 units bolus x 1 Start heparin infusion at 1100 units/hr Check anti-Xa level at 2300 and daily while on  heparin Continue to monitor H&H and platelets  Lorelei Pont, PharmD, BCPS 12/13/2021 3:39 PM ED Clinical Pharmacist -  [phone]

## 2021-12-13 NOTE — Progress Notes (Signed)
Plan of Care Note for accepted transfer   Patient: [name] MRN: [id number]   [place]: 12/13/2021  Facility requesting transfer: [facility] Requesting Provider: Kathrynn Humble Reason for transfer: chest pain Facility course: Patient with h/o DM, HTN, HLD, OSA, class 2 obesity, and UC presenting with chest pain.  Mid-sternal pain, radiating to back.  Pain not improved with GI cocktail or morphine.  Still with 4/10 pain.  EKG with new RBBB.  HEAR score is 7.  Troponin 6.  Appears to need to be monitored overnight for CP obs.  Started on heparin drip - can stop if second troponin is still negative.   Plan of care: The patient is accepted for admission to Telemetry unit, at [facility].   Author: Karmen Bongo, MD 12/13/2021  Check [email or web address] for on-call coverage.  Nursing staff, Please call Regino Ramirez number on Amion as soon as patient's arrival, so appropriate admitting provider can evaluate the pt.

## 2021-12-14 ENCOUNTER — Observation Stay (HOSPITAL_BASED_OUTPATIENT_CLINIC_OR_DEPARTMENT_OTHER): Payer: Medicare Other

## 2021-12-14 ENCOUNTER — Other Ambulatory Visit: Payer: Self-pay | Admitting: Student

## 2021-12-14 DIAGNOSIS — E785 Hyperlipidemia, unspecified: Secondary | ICD-10-CM | POA: Diagnosis not present

## 2021-12-14 DIAGNOSIS — R079 Chest pain, unspecified: Secondary | ICD-10-CM | POA: Diagnosis not present

## 2021-12-14 DIAGNOSIS — I208 Other forms of angina pectoris: Secondary | ICD-10-CM | POA: Diagnosis not present

## 2021-12-14 DIAGNOSIS — I10 Essential (primary) hypertension: Secondary | ICD-10-CM | POA: Diagnosis not present

## 2021-12-14 DIAGNOSIS — R072 Precordial pain: Secondary | ICD-10-CM

## 2021-12-14 DIAGNOSIS — E1165 Type 2 diabetes mellitus with hyperglycemia: Secondary | ICD-10-CM | POA: Diagnosis not present

## 2021-12-14 DIAGNOSIS — Z96651 Presence of right artificial knee joint: Secondary | ICD-10-CM | POA: Diagnosis not present

## 2021-12-14 DIAGNOSIS — Z79899 Other long term (current) drug therapy: Secondary | ICD-10-CM | POA: Diagnosis not present

## 2021-12-14 LAB — COMPREHENSIVE METABOLIC PANEL
ALT: 25 U/L (ref 0–44)
AST: 27 U/L (ref 15–41)
Albumin: 3.7 g/dL (ref 3.5–5.0)
Alkaline Phosphatase: 62 U/L (ref 38–126)
Anion gap: 8 (ref 5–15)
BUN: 19 mg/dL (ref 8–23)
CO2: 27 mmol/L (ref 22–32)
Calcium: 9.2 mg/dL (ref 8.9–10.3)
Chloride: 104 mmol/L (ref 98–111)
Creatinine, Ser: 0.93 mg/dL (ref 0.44–1.00)
GFR, Estimated: >60 mL/min (ref >60)
Glucose, Bld: 136 mg/dL — ABNORMAL HIGH (ref 70–99)
Potassium: 4 mmol/L (ref 3.5–5.1)
Sodium: 139 mmol/L (ref 135–145)
Total Bilirubin: 0.6 mg/dL (ref 0.3–1.2)
Total Protein: 6.9 g/dL (ref 6.5–8.1)

## 2021-12-14 LAB — CBC WITH DIFFERENTIAL/PLATELET
Abs Immature Granulocytes: 0.04 10*3/uL (ref 0.00–0.07)
Basophils Absolute: 0.1 10*3/uL (ref 0.0–0.1)
Basophils Relative: 1 %
Eosinophils Absolute: 0.1 10*3/uL (ref 0.0–0.5)
Eosinophils Relative: 1 %
HCT: 43.2 % (ref 36.0–46.0)
Hemoglobin: 14.9 g/dL (ref 12.0–15.0)
Immature Granulocytes: 0 %
Lymphocytes Relative: 21 %
Lymphs Abs: 2.3 10*3/uL (ref 0.7–4.0)
MCH: 31.6 pg (ref 26.0–34.0)
MCHC: 34.5 g/dL (ref 30.0–36.0)
MCV: 91.7 fL (ref 80.0–100.0)
Monocytes Absolute: 0.9 10*3/uL (ref 0.1–1.0)
Monocytes Relative: 9 %
Neutro Abs: 7.4 10*3/uL (ref 1.7–7.7)
Neutrophils Relative %: 68 %
Platelets: 227 10*3/uL (ref 150–400)
RBC: 4.71 MIL/uL (ref 3.87–5.11)
RDW: 12.9 % (ref 11.5–15.5)
WBC: 10.8 10*3/uL — ABNORMAL HIGH (ref 4.0–10.5)
nRBC: 0 % (ref 0.0–0.2)

## 2021-12-14 LAB — GLUCOSE, CAPILLARY
Glucose-Capillary: 141 mg/dL — ABNORMAL HIGH (ref 70–99)
Glucose-Capillary: 137 mg/dL — ABNORMAL HIGH (ref 70–99)

## 2021-12-14 LAB — MAGNESIUM: Magnesium: 2 mg/dL (ref 1.7–2.4)

## 2021-12-14 LAB — ECHOCARDIOGRAM COMPLETE
Area-P 1/2: 3.77 cm2
Height: 66 in
S' Lateral: 2.7 cm
Weight: 3710.78 oz

## 2021-12-14 LAB — LIPID PANEL
Cholesterol: 147 mg/dL (ref 0–200)
HDL: 61 mg/dL (ref >40)
LDL Cholesterol: 70 mg/dL (ref 0–99)
Total CHOL/HDL Ratio: 2.4 RATIO
Triglycerides: 78 mg/dL (ref <150)
VLDL: 16 mg/dL (ref 0–40)

## 2021-12-14 LAB — PHOSPHORUS: Phosphorus: 4.4 mg/dL (ref 2.5–4.6)

## 2021-12-14 LAB — HEMOGLOBIN A1C
Hgb A1c MFr Bld: 6.4 % — ABNORMAL HIGH (ref 4.8–5.6)
Mean Plasma Glucose: 136.98 mg/dL

## 2021-12-14 LAB — HIV ANTIBODY (ROUTINE TESTING W REFLEX): HIV Screen 4th Generation wRfx: NONREACTIVE

## 2021-12-14 LAB — HEPARIN LEVEL (UNFRACTIONATED)
Heparin Unfractionated: 0.34 IU/mL (ref 0.30–0.70)
Heparin Unfractionated: 0.37 IU/mL (ref 0.30–0.70)

## 2021-12-14 MED ORDER — METOPROLOL SUCCINATE ER 100 MG PO TB24: 100.0000 mg | ORAL_TABLET | Freq: Every day | ORAL | Status: DC

## 2021-12-14 MED ORDER — DIPHENHYDRAMINE HCL 25 MG PO CAPS
25.0000 mg | ORAL_CAPSULE | Freq: Every day | ORAL | Status: DC
Start: 2021-12-14 — End: 2021-12-14

## 2021-12-14 MED ORDER — METOPROLOL TARTRATE 50 MG PO TABS: 50.0000 mg | ORAL_TABLET | Freq: Once | ORAL | 0 refills | Status: DC

## 2021-12-14 MED ORDER — ASPIRIN 81 MG PO TBEC: 81.0000 mg | DELAYED_RELEASE_TABLET | Freq: Every day | ORAL | 12 refills | Status: AC

## 2021-12-14 MED ORDER — POLYVINYL ALCOHOL 1.4 % OP SOLN
1.0000 [drp] | OPHTHALMIC | Status: DC | PRN
  Administered 2021-12-14: 1 [drp] via OPHTHALMIC
  Filled 2021-12-14: qty 15

## 2021-12-14 MED ORDER — ZOLPIDEM TARTRATE 5 MG PO TABS
5.0000 mg | ORAL_TABLET | Freq: Every day | ORAL | Status: DC
Start: 2021-12-14 — End: 2021-12-14

## 2021-12-14 MED ORDER — ESOMEPRAZOLE MAGNESIUM 20 MG PO CPDR: 20.0000 mg | DELAYED_RELEASE_CAPSULE | Freq: Two times a day (BID) | ORAL | 0 refills | Status: DC

## 2021-12-14 MED ORDER — TRAMADOL HCL 50 MG PO TABS: 50.0000 mg | ORAL_TABLET | Freq: Every day | ORAL | Status: DC

## 2021-12-14 MED ORDER — METOPROLOL SUCCINATE ER 50 MG PO TB24
50.0000 mg | ORAL_TABLET | Freq: Every day | ORAL | Status: DC
  Administered 2021-12-14: 50 mg via ORAL
  Filled 2021-12-14: qty 1

## 2021-12-14 MED ORDER — METOPROLOL SUCCINATE ER 50 MG PO TB24: 50.0000 mg | ORAL_TABLET | Freq: Every day | ORAL | Status: DC

## 2021-12-14 NOTE — Progress Notes (Signed)
[name] for Heparin Indication: chest pain/ACS  Allergies  Allergen Reactions   Amitriptyline     Just makes me feel sick    Amoxicillin     REACTION: Hives   Biaxin [Clarithromycin]     REACTION: Hives   Codeine     Feel sick    Crestor [Rosuvastatin Calcium]     ARM PAIN   Doxycycline     REACTION: Hives   Escitalopram     unknown   Gabapentin Diarrhea   Pantoprazole     unknown   Penicillins     REACTION: Hives   Pravachol     ARM PAIN   Ranitidine Hcl     unknown   Spironolactone     unknown   Topamax [Topiramate] Anxiety    Patient Measurements: Height: '5\' 6"'$  (167.6 cm) Weight: 105.2 kg (231 lb 14.4 oz) IBW/kg (Calculated) : 59.3 Heparin Dosing Weight: 83.2 kg  Vital Signs: Temp: 98.2 F (36.8 C) (06/04 0007) Temp Source: Oral (06/04 0007) BP: 120/82 (06/04 0007) Pulse Rate: 59 (06/04 0007)  Labs: Recent Labs    12/13/21 1326 12/13/21 1604 12/13/21 1627 12/13/21 2224  HGB 15.7*  --   --   --   HCT 45.7  --   --   --   PLT 287  --   --   --   LABPROT  --   --  13.7  --   INR  --   --  1.1  --   HEPARINUNFRC  --   --   --  0.34  CREATININE 0.99  --   --   --   TROPONINIHS 6 7  --   --      Estimated Creatinine Clearance: 65.8 mL/min (by C-G formula based on SCr of 0.99 mg/dL).   Medical History: Past Medical History:  Diagnosis Date   Anxiety    Arthritis    Depression    DM (diabetes mellitus) ([place])    Diet controlled   Dysrhythmia    SVT   Goiter    neg biopsy   Hyperlipidemia    Hypertension    Migraine headache    PONV (postoperative nausea and vomiting)    SVT (supraventricular tachycardia) (HCC)    Ulcerative colitis (HCC)     Medications:  Medications Prior to Admission  Medication Sig Dispense Refill Last Dose   ALPRAZolam (XANAX) 0.5 MG tablet Take 0.5 mg by mouth at bedtime as needed for anxiety or sleep.   Past Week   atorvastatin (LIPITOR) 20 MG tablet Take 20 mg by  mouth daily.   12/13/2021   colestipol (COLESTID) 1 g tablet Take 2 g by mouth 2 (two) times daily.   12/13/2021   diphenhydrAMINE (BENADRYL) 25 MG tablet Take 25 mg by mouth every evening.   12/12/2021   metoprolol (TOPROL-XL) 100 MG 24 hr tablet Take 50 mg by mouth at bedtime.   12/12/2021 at 2130   Multiple Vitamins-Minerals (MULTIVITAMIN WITH MINERALS) tablet Take 1 tablet by mouth daily.   12/13/2021   Probiotic Product (PRO-BIOTIC BLEND PO) Take 1 capsule by mouth daily.    12/12/2021   propranolol (INDERAL) 10 MG tablet Take 10 mg by mouth daily as needed (For SVT episodes).   unknown at 0800   rizatriptan (MAXALT) 10 MG tablet Take 10 mg by mouth daily as needed for migraine. May repeat in 2 hours if needed. For migraines   Past Month  sertraline (ZOLOFT) 100 MG tablet Take 200 mg by mouth daily.   12/13/2021   traMADol (ULTRAM) 50 MG tablet Take 50 mg by mouth 2 (two) times daily as needed for severe pain.  5 12/12/2021   zolpidem (AMBIEN) 10 MG tablet Take 10 mg by mouth at bedtime.   12/12/2021   HYDROcodone-acetaminophen (NORCO/VICODIN) 5-325 MG tablet Take 1 tablet by mouth every 4 (four) hours as needed for moderate pain ((score 4 to 6)). (Patient not taking: Reported on 12/13/2021) 30 tablet 0 Not Taking   methocarbamol (ROBAXIN) 500 MG tablet Take 1 tablet (500 mg total) by mouth every 6 (six) hours as needed for muscle spasms. (Patient not taking: Reported on 12/13/2021) 30 tablet 1 Not Taking    Scheduled:   aspirin EC  81 mg Oral Daily   atorvastatin  40 mg Oral Daily   insulin aspart  0-20 Units Subcutaneous TID WC   insulin aspart  0-5 Units Subcutaneous QHS   Infusions:   heparin 1,100 Units/hr (12/13/21 1639)   PRN: acetaminophen, HYDROmorphone (DILAUDID) injection, melatonin, nitroGLYCERIN, ondansetron (ZOFRAN) IV, oxyCODONE, polyethylene glycol  Assessment: 73 yof with a history of DM, HTN, HLD, OSA, class 2 obesity, and UC. Patient is presenting with chest pain. Heparin per pharmacy  consult placed for chest pain/ACS.  Patient is not on anticoagulation prior to arrival.  Hgb 15.7; plt 287  6/4 AM update:  Heparin level therapeutic   Goal of Therapy:  Heparin level 0.3-0.7 units/ml Monitor platelets by anticoagulation protocol: Yes   Plan:  Cont heparin 1100 units/hr Heparin level with AM labs  Narda Bonds, PharmD, [place] Pharmacist Phone: [phone]

## 2021-12-14 NOTE — Discharge Summary (Signed)
Physician Discharge Summary  [name] HUT:[id number] DOB: 09-24-1950 DOA: 12/13/2021  PCP: Shirline Frees, MD  Admit date: 12/13/2021 Discharge date: 12/14/2021  Admitted From: Home Disposition: Home  Recommendations for Outpatient Follow-up:  Follow up with PCP in 1-2 weeks Follow-up with cardiology as scheduled  Home Health: None Equipment/Devices: None  Discharge Condition: Stable CODE STATUS: Full Diet recommendation: Low-salt low-fat gluten-free diet as discussed  Brief/Interim Summary: [name] is a 71 y.o. female with medical history significant for obesity, hypertension, supraventricular tachycardia, hyperlipidemia, chronic back pain status post back surgery a year ago, who initially presented to [facility] ED with complaints of constant centrally located chest pain, with onset 1 to 3 hours prior to presentation, during lunchtime, while she was making a sandwich.  Described as heaviness pressure-like radiating to her back.  Not worsened by exertion. Improved with leaning forward.  Associated with nausea and diaphoresis   Discharge Diagnoses:  Principal Problem:   Chest pain of uncertain etiology  Exertional chest pain/pressure, rule out ACS -Symptoms resolved with supportive care -Cardiology following, appreciate insight recommendations -ACS ruled out -negative EKG/troponin -A1c 6.4, lipid panel within normal limits -Cardiology recommending outpatient CT calcium scoring -further recommendations pending that imaging -Echocardiogram unremarkable per discussion with cardiology   Hyperglycemia, A1c 6.4 consistent with prediabetes -Lengthy discussion at bedside about need for dietary improvement, patient has ulcerative colitis, attempts to eat gluten-free which should limit most of her glucose intake   Hyperlipidemia Continue statin   History of supraventricular tachycardia Continue home meds, no changes   Chronic anxiety/depression Resume home regimen    Obesity BMI 37 Recommend weight loss outpatient with regular physical activity and healthy dieting.  Discharge Instructions  Discharge Instructions     Discharge patient   Complete by: As directed    Discharge disposition: 01-Home or Self Care   Discharge patient date: 12/14/2021      Allergies as of 12/14/2021       Reactions   Amitriptyline    Just makes me feel sick    Amoxicillin    REACTION: Hives   Biaxin [clarithromycin]    REACTION: Hives   Codeine    Feel sick    Crestor [rosuvastatin Calcium]    ARM PAIN   Doxycycline    REACTION: Hives   Escitalopram    unknown   Gabapentin Diarrhea   Pantoprazole    unknown   Penicillins    REACTION: Hives   Pravachol    ARM PAIN   Ranitidine Hcl    unknown   Spironolactone    unknown   Topamax [topiramate] Anxiety        Medication List     STOP taking these medications    HYDROcodone-acetaminophen 5-325 MG tablet Commonly known as: NORCO/VICODIN   methocarbamol 500 MG tablet Commonly known as: ROBAXIN       TAKE these medications    ALPRAZolam 0.5 MG tablet Commonly known as: XANAX Take 0.5 mg by mouth at bedtime as needed for anxiety or sleep.   aspirin EC 81 MG tablet Take 1 tablet (81 mg total) by mouth daily. Swallow whole. Start taking on: December 15, 2021   atorvastatin 20 MG tablet Commonly known as: LIPITOR Take 20 mg by mouth daily.   colestipol 1 g tablet Commonly known as: COLESTID Take 2 g by mouth 2 (two) times daily.   diphenhydrAMINE 25 MG tablet Commonly known as: BENADRYL Take 25 mg by mouth every evening.   esomeprazole 20 MG capsule  Commonly known as: NexIUM Take 1 capsule (20 mg total) by mouth 2 (two) times daily before a meal.   metoprolol succinate 100 MG 24 hr tablet Commonly known as: TOPROL-XL Take 50 mg by mouth at bedtime.   metoprolol tartrate 50 MG tablet Commonly known as: Lopressor Take 1 tablet (50 mg total) by mouth once for 1 dose. Take 1.5 to 2  hours prior to coronary CTA.   multivitamin with minerals tablet Take 1 tablet by mouth daily.   PRO-BIOTIC BLEND PO Take 1 capsule by mouth daily.   propranolol 10 MG tablet Commonly known as: INDERAL Take 10 mg by mouth daily as needed (For SVT episodes).   rizatriptan 10 MG tablet Commonly known as: MAXALT Take 10 mg by mouth daily as needed for migraine. May repeat in 2 hours if needed. For migraines   sertraline 100 MG tablet Commonly known as: ZOLOFT Take 200 mg by mouth daily.   traMADol 50 MG tablet Commonly known as: ULTRAM Take 50 mg by mouth 2 (two) times daily as needed for severe pain.   zolpidem 10 MG tablet Commonly known as: AMBIEN Take 10 mg by mouth at bedtime.        Follow-up Information     Nahser, Wonda Cheng, MD Follow up.   Specialty: Cardiology Why: Hospital follow-up with Cardiologys scheduled for 01/05/2022 at 10:20am. If this date/time does not work for you, please call our office to reschedule. Contact information: [address] [place] [address] [phone]                Allergies  Allergen Reactions   Amitriptyline     Just makes me feel sick    Amoxicillin     REACTION: Hives   Biaxin [Clarithromycin]     REACTION: Hives   Codeine     Feel sick    Crestor [Rosuvastatin Calcium]     ARM PAIN   Doxycycline     REACTION: Hives   Escitalopram     unknown   Gabapentin Diarrhea   Pantoprazole     unknown   Penicillins     REACTION: Hives   Pravachol     ARM PAIN   Ranitidine Hcl     unknown   Spironolactone     unknown   Topamax [Topiramate] Anxiety    Consultations: Cardiology  Procedures/Studies: DG Chest Port 1 View  Result Date: 12/13/2021 CLINICAL DATA:  Chest pain EXAM: PORTABLE CHEST 1 VIEW COMPARISON:  03/10/2016 FINDINGS: The heart size and mediastinal contours are within normal limits. No focal airspace consolidation, pleural effusion, or pneumothorax. The visualized skeletal  structures are unremarkable. IMPRESSION: No active disease. Electronically Signed   By: Davina Poke D.O.   On: 12/13/2021 14:02   ECHOCARDIOGRAM COMPLETE  Result Date: 12/14/2021    ECHOCARDIOGRAM REPORT   Patient Name:   [name] Date of Exam: 12/14/2021 Medical Rec #:  [id number]         Height:       66.0 in Accession #:    [id number]        Weight:       231.9 lb Date of Birth:  Jun 21, 1951         BSA:          2.130 m Patient Age:    71 years          BP:           168/78 mmHg Patient  Gender: F                 HR:           60 bpm. Exam Location:  Inpatient Procedure: 2D Echo Indications:    chest pain  History:        Patient has no prior history of Echocardiogram examinations.                 Risk Factors:Dyslipidemia.  Sonographer:    Johny Chess RDCS Referring Phys: [id number] [place]  1. Left ventricular ejection fraction, by estimation, is 60 to 65%. The left ventricle has normal function. The left ventricle has no regional wall motion abnormalities. There is mild left ventricular hypertrophy. Left ventricular diastolic parameters are indeterminate.  2. Right ventricular systolic function is normal. The right ventricular size is mildly enlarged. There is normal pulmonary artery systolic pressure. The estimated right ventricular systolic pressure is 65.6 mmHg.  3. The mitral valve is normal in structure. Trivial mitral valve regurgitation. No evidence of mitral stenosis.  4. The aortic valve is tricuspid. Aortic valve regurgitation is not visualized. No aortic stenosis is present.  5. The inferior vena cava is normal in size with greater than 50% respiratory variability, suggesting right atrial pressure of 3 mmHg. FINDINGS  Left Ventricle: Left ventricular ejection fraction, by estimation, is 60 to 65%. The left ventricle has normal function. The left ventricle has no regional wall motion abnormalities. The left ventricular internal cavity size was normal in size. There  is  mild left ventricular hypertrophy. Left ventricular diastolic parameters are indeterminate. Right Ventricle: The right ventricular size is mildly enlarged. Right vetricular wall thickness was not well visualized. Right ventricular systolic function is normal. There is normal pulmonary artery systolic pressure. The tricuspid regurgitant velocity  is 2.44 m/s, and with an assumed right atrial pressure of 3 mmHg, the estimated right ventricular systolic pressure is 81.2 mmHg. Left Atrium: Left atrial size was normal in size. Right Atrium: Right atrial size was normal in size. Pericardium: There is no evidence of pericardial effusion. Mitral Valve: The mitral valve is normal in structure. Trivial mitral valve regurgitation. No evidence of mitral valve stenosis. Tricuspid Valve: The tricuspid valve is normal in structure. Tricuspid valve regurgitation is trivial. Aortic Valve: The aortic valve is tricuspid. Aortic valve regurgitation is not visualized. No aortic stenosis is present. Pulmonic Valve: The pulmonic valve was not well visualized. Pulmonic valve regurgitation is not visualized. Aorta: The aortic root and ascending aorta are structurally normal, with no evidence of dilitation. Venous: The inferior vena cava is normal in size with greater than 50% respiratory variability, suggesting right atrial pressure of 3 mmHg. IAS/Shunts: The interatrial septum was not well visualized.  LEFT VENTRICLE PLAX 2D LVIDd:         4.60 cm   Diastology LVIDs:         2.70 cm   LV e' medial:    9.08 cm/s LV PW:         1.00 cm   LV E/e' medial:  11.2 LV IVS:        0.90 cm   LV e' lateral:   7.28 cm/s LVOT diam:     1.90 cm   LV E/e' lateral: 14.0 LV SV:         61 LV SV Index:   29 LVOT Area:     2.84 cm  RIGHT VENTRICLE  IVC RV S prime:     12.80 cm/s  IVC diam: 1.80 cm TAPSE (M-mode): 2.4 cm LEFT ATRIUM             Index        RIGHT ATRIUM           Index LA diam:        4.30 cm 2.02 cm/m   RA Area:     15.70  cm LA Vol (A2C):   38.1 ml 17.89 ml/m  RA Volume:   42.10 ml  19.77 ml/m LA Vol (A4C):   49.9 ml 23.43 ml/m LA Biplane Vol: 44.9 ml 21.08 ml/m  AORTIC VALVE LVOT Vmax:   91.70 cm/s LVOT Vmean:  58.700 cm/s LVOT VTI:    0.215 m  AORTA Ao Root diam: 3.00 cm Ao Asc diam:  3.40 cm MITRAL VALVE                TRICUSPID VALVE MV Area (PHT): 3.77 cm     TR Peak grad:   23.8 mmHg MV Decel Time: 201 msec     TR Vmax:        244.00 cm/s MV E velocity: 102.00 cm/s MV A velocity: 103.00 cm/s  SHUNTS MV E/A ratio:  0.99         Systemic VTI:  0.22 m                             Systemic Diam: 1.90 cm Oswaldo Milian MD Electronically signed by Oswaldo Milian MD Signature Date/Time: 12/14/2021/2:09:48 PM    Final      Subjective: No acute issues or events overnight denies nausea vomiting diarrhea constipation headache fevers chills chest pain shortness of breath  Discharge Exam: Vitals:   12/14/21 0833 12/14/21 1302  BP: (!) 168/78 (!) 157/87  Pulse: 62 63  Resp:  20  Temp:  98.1 F (36.7 C)  SpO2:  92%   Vitals:   12/14/21 0453 12/14/21 0751 12/14/21 0833 12/14/21 1302  BP: (!) 159/84 (!) 168/78 (!) 168/78 (!) 157/87  Pulse: (!) 59 60 62 63  Resp: '19 19  20  '$ Temp:  98.7 F (37.1 C)  98.1 F (36.7 C)  TempSrc:  Oral  Oral  SpO2: 95% 96%  92%  Weight:      Height:        General: Pt is alert, awake, not in acute distress Cardiovascular: RRR, S1/S2 +, no rubs, no gallops Respiratory: CTA bilaterally, no wheezing, no rhonchi Abdominal: Soft, NT, ND, bowel sounds + Extremities: no edema, no cyanosis    The results of significant diagnostics from this hospitalization (including imaging, microbiology, ancillary and laboratory) are listed below for reference.     Microbiology: No results found for this or any previous visit (from the past 240 hour(s)).   Labs: BNP (last 3 results) No results for input(s): BNP in the last 8760 hours. Basic Metabolic Panel: Recent Labs  Lab  12/13/21 1326 12/14/21 0300  NA 137 139  K 3.8 4.0  CL 103 104  CO2 23 27  GLUCOSE 164* 136*  BUN 25* 19  CREATININE 0.99 0.93  CALCIUM 10.1 9.2  MG  --  2.0  PHOS  --  4.4   Liver Function Tests: Recent Labs  Lab 12/14/21 0300  AST 27  ALT 25  ALKPHOS 62  BILITOT 0.6  PROT 6.9  ALBUMIN 3.7   No results for  input(s): LIPASE, AMYLASE in the last 168 hours. No results for input(s): AMMONIA in the last 168 hours. CBC: Recent Labs  Lab 12/13/21 1326 12/14/21 0300  WBC 11.2* 10.8*  NEUTROABS  --  7.4  HGB 15.7* 14.9  HCT 45.7 43.2  MCV 90.7 91.7  PLT 287 227   Cardiac Enzymes: No results for input(s): CKTOTAL, CKMB, CKMBINDEX, TROPONINI in the last 168 hours. BNP: Invalid input(s): POCBNP CBG: Recent Labs  Lab 12/13/21 1946 12/14/21 0749 12/14/21 1221  GLUCAP 179* 141* 137*   D-Dimer No results for input(s): DDIMER in the last 72 hours. Hgb A1c Recent Labs    12/14/21 0300  HGBA1C 6.4*   Lipid Profile Recent Labs    12/14/21 0300  CHOL 147  HDL 61  LDLCALC 70  TRIG 78  CHOLHDL 2.4   Thyroid function studies No results for input(s): TSH, T4TOTAL, T3FREE, THYROIDAB in the last 72 hours.  Invalid input(s): FREET3 Anemia work up No results for input(s): VITAMINB12, FOLATE, FERRITIN, TIBC, IRON, RETICCTPCT in the last 72 hours. Urinalysis    Component Value Date/Time   COLORURINE YELLOW 11/04/2020 1226   APPEARANCEUR HAZY (A) 11/04/2020 1226   LABSPEC 1.020 11/04/2020 1226   PHURINE 6.0 11/04/2020 1226   GLUCOSEU NEGATIVE 11/04/2020 1226   HGBUR NEGATIVE 11/04/2020 1226   BILIRUBINUR NEGATIVE 11/04/2020 1226   KETONESUR NEGATIVE 11/04/2020 1226   PROTEINUR NEGATIVE 11/04/2020 1226   UROBILINOGEN 0.2 03/17/2011 1400   NITRITE NEGATIVE 11/04/2020 1226   LEUKOCYTESUR NEGATIVE 11/04/2020 1226   Sepsis Labs Invalid input(s): PROCALCITONIN,  WBC,  LACTICIDVEN Microbiology No results found for this or any previous visit (from the past 240  hour(s)).   Time coordinating discharge: Over 30 minutes  SIGNED:   Little Ishikawa, DO [facility] 12/14/2021, 4:10 PM Pager   If 7PM-7AM, please contact night-coverage [email or web address]

## 2021-12-14 NOTE — Progress Notes (Signed)
  Echocardiogram 2D Echocardiogram has been performed.  [name] 12/14/2021, 10:40 AM

## 2021-12-14 NOTE — Progress Notes (Signed)
   Ordered outpatient coronary CTA for further evaluation of atypical chest pain. Per Dr. Harl Bowie, patient can take Lopressor '50mg'$  1.5 to 2 hours prior to CTA. Will prescribed this. Called and explained this to patient. Will put in additional information about this in discharge paperwork. Outpatient follow-up arranged for 01/05/2022 so we will try to get CTA done before this. Our office will call her to help arrange this. Please see Dr. Nelly Laurence rounding note from today for more information.  Darreld Mclean, PA-C 12/14/2021 11:31 AM

## 2021-12-14 NOTE — Discharge Instructions (Signed)
   Your cardiac CT will be scheduled at one of the below locations:   [facility] [address] [place], [place] [address] [phone]  If scheduled at [facility], please arrive at the [facility] and Children's Entrance (Entrance C2) of [facility] 30 minutes prior to test start time. You can use the FREE valet parking offered at entrance C (encouraged to control the heart rate for the test)  Proceed to the [facility] Radiology Department (first floor) to check-in and test prep.  All radiology patients and guests should use entrance C2 at [facility], accessed from [facility], even though the hospital's physical address listed is [address].     Please follow these instructions carefully (unless otherwise directed):  On the Night Before the Test: Be sure to Drink plenty of water. Do not consume any caffeinated/decaffeinated beverages or chocolate 12 hours prior to your test. Do not take any antihistamines 12 hours prior to your test.  On the Day of the Test: Drink plenty of water until 1 hour prior to the test. Do not eat any food 4 hours prior to the test. You may take your regular medications prior to the test.  Take the short-acting metoprolol (Lopressor) two hours prior to test. FEMALES- please wear underwire-free bra if available, avoid dresses & tight clothing      After the Test: Drink plenty of water. After receiving IV contrast, you may experience a mild flushed feeling. This is normal. On occasion, you may experience a mild rash up to 24 hours after the test. This is not dangerous. If this occurs, you can take Benadryl 25 mg and increase your fluid intake. If you experience trouble breathing, this can be serious. If it is severe call 911 IMMEDIATELY. If it is mild, please call our office. If you take any of these medications: Glipizide/Metformin, Avandament, Glucavance, please do not take 48 hours after  completing test unless otherwise instructed.  We will call to schedule your test 2-4 weeks out understanding that some insurance companies will need an authorization prior to the service being performed.   For non-scheduling related questions, please contact the cardiac imaging nurse navigator should you have any questions/concerns: Marchia Bond, Cardiac Imaging Nurse Navigator Gordy Clement, Cardiac Imaging Nurse Navigator [facility] Direct Office Dial: [phone]   For scheduling needs, including cancellations and rescheduling, please call [place], [phone].

## 2021-12-14 NOTE — Progress Notes (Signed)
Progress Note  Patient Name: [name] Date of Encounter: 12/14/2021  [facility] Cardiologist: Mertie Moores, MD   Subjective   Her symptoms have improved. She notes concern for anxiety. She is unsure. Her echo done today shows normal LV/RV function. No significant valve disease. Trops are negative.  Inpatient Medications    Scheduled Meds:  aspirin EC  81 mg Oral Daily   atorvastatin  40 mg Oral Daily   diphenhydrAMINE  25 mg Oral QHS   insulin aspart  0-20 Units Subcutaneous TID WC   insulin aspart  0-5 Units Subcutaneous QHS   metoprolol succinate  50 mg Oral Daily   traMADol  50 mg Oral QHS   zolpidem  5 mg Oral QHS   Continuous Infusions:  heparin 1,100 Units/hr (12/13/21 1639)   PRN Meds: acetaminophen, HYDROmorphone (DILAUDID) injection, nitroGLYCERIN, ondansetron (ZOFRAN) IV, oxyCODONE, polyethylene glycol, polyvinyl alcohol   Vital Signs    Vitals:   12/14/21 0432 12/14/21 0453 12/14/21 0751 12/14/21 0833  BP: (!) 169/88 (!) 159/84 (!) 168/78 (!) 168/78  Pulse: 60 (!) 59 60 62  Resp: '14 19 19   '$ Temp: 98.2 F (36.8 C)  98.7 F (37.1 C)   TempSrc: Oral  Oral   SpO2: 97% 95% 96%   Weight: 105.2 kg     Height:        Intake/Output Summary (Last 24 hours) at 12/14/2021 1102 Last data filed at 12/14/2021 0435 Gross per 24 hour  Intake 130.03 ml  Output 800 ml  Net -669.97 ml      12/14/2021    4:32 AM 12/13/2021    7:48 PM 12/13/2021    1:07 PM  Last 3 Weights  Weight (lbs) 231 lb 14.8 oz 231 lb 14.4 oz 230 lb  Weight (kg) 105.2 kg 105.189 kg 104.327 kg      Telemetry    NSR  - Personally Reviewed  ECG    NSR, RBBB - Personally Reviewed  Physical Exam   Vitals:   12/14/21 0751 12/14/21 0833  BP: (!) 168/78 (!) 168/78  Pulse: 60 62  Resp: 19   Temp: 98.7 F (37.1 C)   SpO2: 96%     GEN: No acute distress.   Neck: No JVD Cardiac: RRR, no murmurs, rubs, or gallops.  Respiratory: Clear to auscultation bilaterally. GI: Soft,  nontender, non-distended  MS: No edema; No deformity. Neuro:  Nonfocal  Psych: Normal affect   Labs    High Sensitivity Troponin:   Recent Labs  Lab 12/13/21 1326 12/13/21 1604  TROPONINIHS 6 7     Chemistry Recent Labs  Lab 12/13/21 1326 12/14/21 0300  NA 137 139  K 3.8 4.0  CL 103 104  CO2 23 27  GLUCOSE 164* 136*  BUN 25* 19  CREATININE 0.99 0.93  CALCIUM 10.1 9.2  MG  --  2.0  PROT  --  6.9  ALBUMIN  --  3.7  AST  --  27  ALT  --  25  ALKPHOS  --  62  BILITOT  --  0.6  GFRNONAA >60 >60  ANIONGAP 11 8    Lipids  Recent Labs  Lab 12/14/21 0300  CHOL 147  TRIG 78  HDL 61  LDLCALC 70  CHOLHDL 2.4    Hematology Recent Labs  Lab 12/13/21 1326 12/14/21 0300  WBC 11.2* 10.8*  RBC 5.04 4.71  HGB 15.7* 14.9  HCT 45.7 43.2  MCV 90.7 91.7  MCH 31.2 31.6  MCHC 34.4 34.5  RDW 13.0 12.9  PLT 287 227   Thyroid No results for input(s): TSH, FREET4 in the last 168 hours.  BNPNo results for input(s): BNP, PROBNP in the last 168 hours.  DDimer No results for input(s): DDIMER in the last 168 hours.   Radiology    DG Chest Port 1 View  Result Date: 12/13/2021 CLINICAL DATA:  Chest pain EXAM: PORTABLE CHEST 1 VIEW COMPARISON:  03/10/2016 FINDINGS: The heart size and mediastinal contours are within normal limits. No focal airspace consolidation, pleural effusion, or pneumothorax. The visualized skeletal structures are unremarkable. IMPRESSION: No active disease. Electronically Signed   By: Davina Poke D.O.   On: 12/13/2021 14:02    Cardiac Studies   Normal LV/RV function No significant valve dx No pulmonary HTN  Patient Profile     [name] is a 71 y.o. female with HTN, SVT, HLD, MDD, DM2 diet controlled, and UC who is being seen today for the evaluation of chest pain at the request of Dr. [place] Crane.  Assessment & Plan    Atypical CP: patient reported chest discomfort while making food. No progressive symptoms. EKG shows no ischemic changes.  Troponin is negative. Her echo is normal. Suspect this is anxiety related but, she does have risk including age and HTN. Can plan for an outpatient coronary CTA, we can schedule this with follow up. She can continue her home medications and will uptitrate BP meds as an outpatient if needed.  SVT- continue home metoprolol  HLD- continue home statin  For questions or updates, please contact [place] Please consult [email or web address] for contact info under        Signed, Janina Mayo, MD  12/14/2021, 11:02 AM

## 2021-12-14 NOTE — Progress Notes (Signed)
ANTICOAGULATION CONSULT NOTE   Pharmacy Consult for Heparin Indication: chest pain/ACS  Allergies  Allergen Reactions   Amitriptyline     Just makes me feel sick    Amoxicillin     REACTION: Hives   Biaxin [Clarithromycin]     REACTION: Hives   Codeine     Feel sick    Crestor [Rosuvastatin Calcium]     ARM PAIN   Doxycycline     REACTION: Hives   Escitalopram     unknown   Gabapentin Diarrhea   Pantoprazole     unknown   Penicillins     REACTION: Hives   Pravachol     ARM PAIN   Ranitidine Hcl     unknown   Spironolactone     unknown   Topamax [Topiramate] Anxiety    Patient Measurements: Height: '5\' 6"'$  (167.6 cm) Weight: 105.2 kg (231 lb 14.8 oz) IBW/kg (Calculated) : 59.3 Heparin Dosing Weight: 83.2 kg  Vital Signs: Temp: 98.7 F (37.1 C) (06/04 0751) Temp Source: Oral (06/04 0751) BP: 168/78 (06/04 0751) Pulse Rate: 60 (06/04 0751)  Labs: Recent Labs    12/13/21 1326 12/13/21 1604 12/13/21 1627 12/13/21 2224 12/14/21 0300  HGB 15.7*  --   --   --  14.9  HCT 45.7  --   --   --  43.2  PLT 287  --   --   --  227  LABPROT  --   --  13.7  --   --   INR  --   --  1.1  --   --   HEPARINUNFRC  --   --   --  0.34 0.37  CREATININE 0.99  --   --   --  0.93  TROPONINIHS 6 7  --   --   --      Estimated Creatinine Clearance: 70 mL/min (by C-G formula based on SCr of 0.93 mg/dL).   Medical History: Past Medical History:  Diagnosis Date   Anxiety    Arthritis    Depression    DM (diabetes mellitus) ([place])    Diet controlled   Dysrhythmia    SVT   Goiter    neg biopsy   Hyperlipidemia    Hypertension    Migraine headache    PONV (postoperative nausea and vomiting)    SVT (supraventricular tachycardia) (HCC)    Ulcerative colitis (HCC)     Medications:  Medications Prior to Admission  Medication Sig Dispense Refill Last Dose   ALPRAZolam (XANAX) 0.5 MG tablet Take 0.5 mg by mouth at bedtime as needed for anxiety or sleep.   Past Week    atorvastatin (LIPITOR) 20 MG tablet Take 20 mg by mouth daily.   12/13/2021   colestipol (COLESTID) 1 g tablet Take 2 g by mouth 2 (two) times daily.   12/13/2021   diphenhydrAMINE (BENADRYL) 25 MG tablet Take 25 mg by mouth every evening.   12/12/2021   metoprolol (TOPROL-XL) 100 MG 24 hr tablet Take 50 mg by mouth at bedtime.   12/12/2021 at 2130   Multiple Vitamins-Minerals (MULTIVITAMIN WITH MINERALS) tablet Take 1 tablet by mouth daily.   12/13/2021   Probiotic Product (PRO-BIOTIC BLEND PO) Take 1 capsule by mouth daily.    12/12/2021   propranolol (INDERAL) 10 MG tablet Take 10 mg by mouth daily as needed (For SVT episodes).   unknown at 0800   rizatriptan (MAXALT) 10 MG tablet Take 10 mg by mouth daily as needed  for migraine. May repeat in 2 hours if needed. For migraines   Past Month   sertraline (ZOLOFT) 100 MG tablet Take 200 mg by mouth daily.   12/13/2021   traMADol (ULTRAM) 50 MG tablet Take 50 mg by mouth 2 (two) times daily as needed for severe pain.  5 12/12/2021   zolpidem (AMBIEN) 10 MG tablet Take 10 mg by mouth at bedtime.   12/12/2021   HYDROcodone-acetaminophen (NORCO/VICODIN) 5-325 MG tablet Take 1 tablet by mouth every 4 (four) hours as needed for moderate pain ((score 4 to 6)). (Patient not taking: Reported on 12/13/2021) 30 tablet 0 Not Taking   methocarbamol (ROBAXIN) 500 MG tablet Take 1 tablet (500 mg total) by mouth every 6 (six) hours as needed for muscle spasms. (Patient not taking: Reported on 12/13/2021) 30 tablet 1 Not Taking    Scheduled:   aspirin EC  81 mg Oral Daily   atorvastatin  40 mg Oral Daily   diphenhydrAMINE  25 mg Oral QHS   insulin aspart  0-20 Units Subcutaneous TID WC   insulin aspart  0-5 Units Subcutaneous QHS   metoprolol succinate  50 mg Oral Daily   traMADol  50 mg Oral QHS   zolpidem  5 mg Oral QHS   Infusions:   heparin 1,100 Units/hr (12/13/21 1639)   PRN: acetaminophen, HYDROmorphone (DILAUDID) injection, nitroGLYCERIN, ondansetron (ZOFRAN) IV,  oxyCODONE, polyethylene glycol, polyvinyl alcohol  Assessment: 79 yof with a history of DM, HTN, HLD, OSA, class 2 obesity, and UC. Patient is presenting with chest pain. No PTA anticoagulation noted. Heparin per pharmacy consult placed for chest pain/ACS. Plan is for coronary CT and angiography on Monday.   Heparin level 0.37 this AM on 1100 units/h. Hgb and plt are within normal limits. No signs of bleeding noted.   Goal of Therapy:  Heparin level 0.3-0.7 units/ml Monitor platelets by anticoagulation protocol: Yes   Plan:  Continue IV heparin gtt at 1100 units/h Daily heparin level, CBC Monitor for signs and symptoms of bleeding  Thank you for involving pharmacy in this patient's care.  Elita Quick, PharmD PGY1 Ambulatory Care Pharmacy Resident 12/14/2021 8:14 AM  **Pharmacist phone directory can be found on [email or web address] listed under [place]**

## 2021-12-25 DIAGNOSIS — I498 Other specified cardiac arrhythmias: Secondary | ICD-10-CM | POA: Diagnosis not present

## 2021-12-25 DIAGNOSIS — R079 Chest pain, unspecified: Secondary | ICD-10-CM | POA: Diagnosis not present

## 2021-12-25 DIAGNOSIS — F419 Anxiety disorder, unspecified: Secondary | ICD-10-CM | POA: Diagnosis not present

## 2021-12-25 DIAGNOSIS — M2550 Pain in unspecified joint: Secondary | ICD-10-CM | POA: Diagnosis not present

## 2021-12-25 DIAGNOSIS — F5101 Primary insomnia: Secondary | ICD-10-CM | POA: Diagnosis not present

## 2021-12-25 DIAGNOSIS — Z09 Encounter for follow-up examination after completed treatment for conditions other than malignant neoplasm: Secondary | ICD-10-CM | POA: Diagnosis not present

## 2022-01-01 ENCOUNTER — Telehealth (HOSPITAL_COMMUNITY): Payer: Self-pay | Admitting: *Deleted

## 2022-01-01 NOTE — Telephone Encounter (Signed)
Attempted to call patient regarding upcoming cardiac CT appointment. °Left message on voicemail with name and callback number ° °Azaylia Fong RN Navigator Cardiac Imaging °[facility] °[phone] Office °[phone] Cell ° °

## 2022-01-02 ENCOUNTER — Ambulatory Visit (HOSPITAL_COMMUNITY)
Admission: RE | Admit: 2022-01-02 | Discharge: 2022-01-02 | Disposition: A | Discharge status: Home / Self Care | Payer: Medicare Other | Source: Ambulatory Visit | Attending: Student | Admitting: Student

## 2022-01-02 DIAGNOSIS — R079 Chest pain, unspecified: Secondary | ICD-10-CM | POA: Diagnosis not present

## 2022-01-02 DIAGNOSIS — R072 Precordial pain: Secondary | ICD-10-CM | POA: Diagnosis not present

## 2022-01-02 MED ORDER — IOHEXOL 350 MG/ML SOLN
100.0000 mL | Freq: Once | INTRAVENOUS | Status: AC | PRN
  Administered 2022-01-02: 100 mL via INTRAVENOUS

## 2022-01-02 MED ORDER — METOPROLOL TARTRATE 5 MG/5ML IV SOLN
INTRAVENOUS | Status: AC
  Administered 2022-01-02: 5 mg via INTRAVENOUS
  Filled 2022-01-02: qty 10

## 2022-01-02 MED ORDER — NITROGLYCERIN 0.4 MG SL SUBL: 0.8000 mg | SUBLINGUAL_TABLET | Freq: Once | SUBLINGUAL | Status: DC

## 2022-01-02 MED ORDER — NITROGLYCERIN 0.4 MG SL SUBL
SUBLINGUAL_TABLET | SUBLINGUAL | Status: AC
  Administered 2022-01-02: 0.8 mg via SUBLINGUAL
  Filled 2022-01-02: qty 2

## 2022-01-02 MED ORDER — METOPROLOL TARTRATE 5 MG/5ML IV SOLN
10.0000 mg | Freq: Once | INTRAVENOUS | Status: AC
Start: 2022-01-02 — End: 2022-01-02

## 2022-01-02 MED ORDER — NITROGLYCERIN 0.4 MG SL SUBL: 0.8000 mg | SUBLINGUAL_TABLET | Freq: Once | SUBLINGUAL | Status: AC

## 2022-01-04 ENCOUNTER — Encounter: Payer: Self-pay | Admitting: Cardiovascular Disease

## 2022-01-05 ENCOUNTER — Encounter: Payer: Self-pay | Admitting: Cardiovascular Disease

## 2022-01-05 ENCOUNTER — Ambulatory Visit (INDEPENDENT_AMBULATORY_CARE_PROVIDER_SITE_OTHER): Payer: Medicare Other | Admitting: Cardiovascular Disease

## 2022-01-05 VITALS — BP 120/84 | HR 75 | Ht 66.0 in | Wt 231.0 lb

## 2022-01-05 DIAGNOSIS — I471 Supraventricular tachycardia, unspecified: Secondary | ICD-10-CM

## 2022-01-05 DIAGNOSIS — E782 Mixed hyperlipidemia: Secondary | ICD-10-CM | POA: Diagnosis not present

## 2022-01-05 DIAGNOSIS — I208 Other forms of angina pectoris: Secondary | ICD-10-CM

## 2022-01-05 MED ORDER — ATORVASTATIN CALCIUM 40 MG PO TABS: 40.0000 mg | ORAL_TABLET | Freq: Every day | ORAL | 3 refills | Status: DC

## 2022-01-12 DIAGNOSIS — M47816 Spondylosis without myelopathy or radiculopathy, lumbar region: Secondary | ICD-10-CM | POA: Diagnosis not present

## 2022-01-23 DIAGNOSIS — M47816 Spondylosis without myelopathy or radiculopathy, lumbar region: Secondary | ICD-10-CM | POA: Diagnosis not present

## 2022-02-09 DIAGNOSIS — M47816 Spondylosis without myelopathy or radiculopathy, lumbar region: Secondary | ICD-10-CM | POA: Diagnosis not present

## 2022-03-06 DIAGNOSIS — M47816 Spondylosis without myelopathy or radiculopathy, lumbar region: Secondary | ICD-10-CM | POA: Diagnosis not present

## 2022-03-17 DIAGNOSIS — I10 Essential (primary) hypertension: Secondary | ICD-10-CM | POA: Diagnosis not present

## 2022-03-17 DIAGNOSIS — G8929 Other chronic pain: Secondary | ICD-10-CM | POA: Diagnosis not present

## 2022-03-17 DIAGNOSIS — F5101 Primary insomnia: Secondary | ICD-10-CM | POA: Diagnosis not present

## 2022-03-17 DIAGNOSIS — G43109 Migraine with aura, not intractable, without status migrainosus: Secondary | ICD-10-CM | POA: Diagnosis not present

## 2022-03-17 DIAGNOSIS — E119 Type 2 diabetes mellitus without complications: Secondary | ICD-10-CM | POA: Diagnosis not present

## 2022-03-17 DIAGNOSIS — K219 Gastro-esophageal reflux disease without esophagitis: Secondary | ICD-10-CM | POA: Diagnosis not present

## 2022-03-17 DIAGNOSIS — E7800 Pure hypercholesterolemia, unspecified: Secondary | ICD-10-CM | POA: Diagnosis not present

## 2022-03-17 DIAGNOSIS — Z0000 Encounter for general adult medical examination without abnormal findings: Secondary | ICD-10-CM | POA: Diagnosis not present

## 2022-03-17 DIAGNOSIS — K580 Irritable bowel syndrome with diarrhea: Secondary | ICD-10-CM | POA: Diagnosis not present

## 2022-03-17 DIAGNOSIS — F419 Anxiety disorder, unspecified: Secondary | ICD-10-CM | POA: Diagnosis not present

## 2022-03-17 DIAGNOSIS — G250 Essential tremor: Secondary | ICD-10-CM | POA: Diagnosis not present

## 2022-04-07 ENCOUNTER — Ambulatory Visit: Payer: Medicare Other | Attending: Cardiovascular Disease

## 2022-04-07 DIAGNOSIS — E782 Mixed hyperlipidemia: Secondary | ICD-10-CM | POA: Insufficient documentation

## 2022-04-07 DIAGNOSIS — I471 Supraventricular tachycardia: Secondary | ICD-10-CM | POA: Diagnosis not present

## 2022-04-07 LAB — LIPID PANEL
Chol/HDL Ratio: 2.8 ratio (ref 0.0–4.4)
Cholesterol, Total: 139 mg/dL (ref 100–199)
HDL: 49 mg/dL (ref >39)
LDL Chol Calc (NIH): 67 mg/dL (ref 0–99)
Triglycerides: 132 mg/dL (ref 0–149)
VLDL Cholesterol Cal: 23 mg/dL (ref 5–40)

## 2022-04-07 LAB — ALT: ALT: 33 IU/L — ABNORMAL HIGH (ref 0–32)

## 2022-04-14 DIAGNOSIS — M47816 Spondylosis without myelopathy or radiculopathy, lumbar region: Secondary | ICD-10-CM | POA: Diagnosis not present

## 2022-04-21 DIAGNOSIS — H25043 Posterior subcapsular polar age-related cataract, bilateral: Secondary | ICD-10-CM | POA: Diagnosis not present

## 2022-04-21 DIAGNOSIS — H52203 Unspecified astigmatism, bilateral: Secondary | ICD-10-CM | POA: Diagnosis not present

## 2022-04-21 DIAGNOSIS — H5213 Myopia, bilateral: Secondary | ICD-10-CM | POA: Diagnosis not present

## 2022-04-21 DIAGNOSIS — H04123 Dry eye syndrome of bilateral lacrimal glands: Secondary | ICD-10-CM | POA: Diagnosis not present

## 2022-04-21 DIAGNOSIS — R7303 Prediabetes: Secondary | ICD-10-CM | POA: Diagnosis not present

## 2022-04-21 DIAGNOSIS — H43813 Vitreous degeneration, bilateral: Secondary | ICD-10-CM | POA: Diagnosis not present

## 2022-04-21 DIAGNOSIS — H2513 Age-related nuclear cataract, bilateral: Secondary | ICD-10-CM | POA: Diagnosis not present

## 2022-05-14 DIAGNOSIS — M47816 Spondylosis without myelopathy or radiculopathy, lumbar region: Secondary | ICD-10-CM | POA: Diagnosis not present

## 2022-06-29 DIAGNOSIS — K580 Irritable bowel syndrome with diarrhea: Secondary | ICD-10-CM | POA: Diagnosis not present

## 2022-06-29 DIAGNOSIS — R1319 Other dysphagia: Secondary | ICD-10-CM | POA: Diagnosis not present

## 2022-06-29 DIAGNOSIS — Z86010 Personal history of colonic polyps: Secondary | ICD-10-CM | POA: Diagnosis not present

## 2022-07-09 DIAGNOSIS — M5416 Radiculopathy, lumbar region: Secondary | ICD-10-CM | POA: Diagnosis not present

## 2022-07-30 DIAGNOSIS — Z1231 Encounter for screening mammogram for malignant neoplasm of breast: Secondary | ICD-10-CM | POA: Diagnosis not present

## 2022-08-07 DIAGNOSIS — M5416 Radiculopathy, lumbar region: Secondary | ICD-10-CM | POA: Diagnosis not present

## 2022-08-27 DIAGNOSIS — M25551 Pain in right hip: Secondary | ICD-10-CM | POA: Diagnosis not present

## 2022-09-17 DIAGNOSIS — G43109 Migraine with aura, not intractable, without status migrainosus: Secondary | ICD-10-CM | POA: Diagnosis not present

## 2022-09-17 DIAGNOSIS — K219 Gastro-esophageal reflux disease without esophagitis: Secondary | ICD-10-CM | POA: Diagnosis not present

## 2022-09-17 DIAGNOSIS — G250 Essential tremor: Secondary | ICD-10-CM | POA: Diagnosis not present

## 2022-09-17 DIAGNOSIS — I10 Essential (primary) hypertension: Secondary | ICD-10-CM | POA: Diagnosis not present

## 2022-09-17 DIAGNOSIS — K580 Irritable bowel syndrome with diarrhea: Secondary | ICD-10-CM | POA: Diagnosis not present

## 2022-09-17 DIAGNOSIS — G8929 Other chronic pain: Secondary | ICD-10-CM | POA: Diagnosis not present

## 2022-09-17 DIAGNOSIS — E7800 Pure hypercholesterolemia, unspecified: Secondary | ICD-10-CM | POA: Diagnosis not present

## 2022-09-17 DIAGNOSIS — F419 Anxiety disorder, unspecified: Secondary | ICD-10-CM | POA: Diagnosis not present

## 2022-09-17 DIAGNOSIS — F5101 Primary insomnia: Secondary | ICD-10-CM | POA: Diagnosis not present

## 2022-09-17 DIAGNOSIS — E119 Type 2 diabetes mellitus without complications: Secondary | ICD-10-CM | POA: Diagnosis not present

## 2022-09-18 DIAGNOSIS — M25551 Pain in right hip: Secondary | ICD-10-CM | POA: Diagnosis not present

## 2022-10-08 DIAGNOSIS — M5416 Radiculopathy, lumbar region: Secondary | ICD-10-CM | POA: Diagnosis not present

## 2022-10-08 DIAGNOSIS — M47816 Spondylosis without myelopathy or radiculopathy, lumbar region: Secondary | ICD-10-CM | POA: Diagnosis not present

## 2022-10-21 DIAGNOSIS — M5451 Vertebrogenic low back pain: Secondary | ICD-10-CM | POA: Diagnosis not present

## 2022-11-05 DIAGNOSIS — M542 Cervicalgia: Secondary | ICD-10-CM | POA: Diagnosis not present

## 2022-11-05 DIAGNOSIS — M5451 Vertebrogenic low back pain: Secondary | ICD-10-CM | POA: Diagnosis not present

## 2022-11-10 DIAGNOSIS — M5451 Vertebrogenic low back pain: Secondary | ICD-10-CM | POA: Diagnosis not present

## 2022-11-24 DIAGNOSIS — M47816 Spondylosis without myelopathy or radiculopathy, lumbar region: Secondary | ICD-10-CM | POA: Diagnosis not present

## 2022-12-01 DIAGNOSIS — M545 Low back pain, unspecified: Secondary | ICD-10-CM | POA: Diagnosis not present

## 2022-12-08 DIAGNOSIS — M47816 Spondylosis without myelopathy or radiculopathy, lumbar region: Secondary | ICD-10-CM | POA: Diagnosis not present

## 2022-12-08 DIAGNOSIS — M5416 Radiculopathy, lumbar region: Secondary | ICD-10-CM | POA: Diagnosis not present

## 2022-12-16 DIAGNOSIS — M5416 Radiculopathy, lumbar region: Secondary | ICD-10-CM | POA: Diagnosis not present

## 2023-01-18 DIAGNOSIS — M47816 Spondylosis without myelopathy or radiculopathy, lumbar region: Secondary | ICD-10-CM | POA: Diagnosis not present

## 2023-01-18 DIAGNOSIS — M533 Sacrococcygeal disorders, not elsewhere classified: Secondary | ICD-10-CM | POA: Diagnosis not present

## 2023-01-25 DIAGNOSIS — M533 Sacrococcygeal disorders, not elsewhere classified: Secondary | ICD-10-CM | POA: Diagnosis not present

## 2023-02-02 ENCOUNTER — Other Ambulatory Visit: Payer: Self-pay | Admitting: Cardiovascular Disease

## 2023-02-02 DIAGNOSIS — E782 Mixed hyperlipidemia: Secondary | ICD-10-CM

## 2023-02-02 DIAGNOSIS — I471 Supraventricular tachycardia, unspecified: Secondary | ICD-10-CM

## 2023-02-03 DIAGNOSIS — R351 Nocturia: Secondary | ICD-10-CM | POA: Diagnosis not present

## 2023-02-03 DIAGNOSIS — N3946 Mixed incontinence: Secondary | ICD-10-CM | POA: Diagnosis not present

## 2023-02-03 DIAGNOSIS — N3010 Interstitial cystitis (chronic) without hematuria: Secondary | ICD-10-CM | POA: Diagnosis not present

## 2023-02-16 DIAGNOSIS — M47816 Spondylosis without myelopathy or radiculopathy, lumbar region: Secondary | ICD-10-CM | POA: Diagnosis not present

## 2023-02-16 DIAGNOSIS — M533 Sacrococcygeal disorders, not elsewhere classified: Secondary | ICD-10-CM | POA: Diagnosis not present

## 2023-02-25 DIAGNOSIS — M5416 Radiculopathy, lumbar region: Secondary | ICD-10-CM | POA: Diagnosis not present

## 2023-03-05 ENCOUNTER — Other Ambulatory Visit: Payer: Self-pay | Admitting: Cardiovascular Disease

## 2023-03-05 DIAGNOSIS — I471 Supraventricular tachycardia, unspecified: Secondary | ICD-10-CM

## 2023-03-05 DIAGNOSIS — E782 Mixed hyperlipidemia: Secondary | ICD-10-CM

## 2023-03-11 DIAGNOSIS — R350 Frequency of micturition: Secondary | ICD-10-CM | POA: Diagnosis not present

## 2023-03-11 DIAGNOSIS — N3946 Mixed incontinence: Secondary | ICD-10-CM | POA: Diagnosis not present

## 2023-03-11 DIAGNOSIS — N3010 Interstitial cystitis (chronic) without hematuria: Secondary | ICD-10-CM | POA: Diagnosis not present

## 2023-03-19 ENCOUNTER — Other Ambulatory Visit: Payer: Self-pay | Admitting: Cardiovascular Disease

## 2023-03-19 DIAGNOSIS — E782 Mixed hyperlipidemia: Secondary | ICD-10-CM

## 2023-03-19 DIAGNOSIS — I471 Supraventricular tachycardia, unspecified: Secondary | ICD-10-CM

## 2023-03-24 DIAGNOSIS — M79605 Pain in left leg: Secondary | ICD-10-CM | POA: Diagnosis not present

## 2023-03-30 DIAGNOSIS — F5101 Primary insomnia: Secondary | ICD-10-CM | POA: Diagnosis not present

## 2023-03-30 DIAGNOSIS — E119 Type 2 diabetes mellitus without complications: Secondary | ICD-10-CM | POA: Diagnosis not present

## 2023-03-30 DIAGNOSIS — G43109 Migraine with aura, not intractable, without status migrainosus: Secondary | ICD-10-CM | POA: Diagnosis not present

## 2023-03-30 DIAGNOSIS — E7800 Pure hypercholesterolemia, unspecified: Secondary | ICD-10-CM | POA: Diagnosis not present

## 2023-03-30 DIAGNOSIS — K219 Gastro-esophageal reflux disease without esophagitis: Secondary | ICD-10-CM | POA: Diagnosis not present

## 2023-03-30 DIAGNOSIS — K580 Irritable bowel syndrome with diarrhea: Secondary | ICD-10-CM | POA: Diagnosis not present

## 2023-03-30 DIAGNOSIS — F419 Anxiety disorder, unspecified: Secondary | ICD-10-CM | POA: Diagnosis not present

## 2023-03-30 DIAGNOSIS — I10 Essential (primary) hypertension: Secondary | ICD-10-CM | POA: Diagnosis not present

## 2023-03-30 DIAGNOSIS — G8929 Other chronic pain: Secondary | ICD-10-CM | POA: Diagnosis not present

## 2023-03-30 DIAGNOSIS — Z0000 Encounter for general adult medical examination without abnormal findings: Secondary | ICD-10-CM | POA: Diagnosis not present

## 2023-03-30 DIAGNOSIS — G250 Essential tremor: Secondary | ICD-10-CM | POA: Diagnosis not present

## 2023-03-30 DIAGNOSIS — Z23 Encounter for immunization: Secondary | ICD-10-CM | POA: Diagnosis not present

## 2023-04-02 DIAGNOSIS — N3946 Mixed incontinence: Secondary | ICD-10-CM | POA: Diagnosis not present

## 2023-04-05 DIAGNOSIS — M5416 Radiculopathy, lumbar region: Secondary | ICD-10-CM | POA: Diagnosis not present

## 2023-04-07 DIAGNOSIS — M5416 Radiculopathy, lumbar region: Secondary | ICD-10-CM | POA: Diagnosis not present

## 2023-04-08 DIAGNOSIS — N3946 Mixed incontinence: Secondary | ICD-10-CM | POA: Diagnosis not present

## 2023-04-08 DIAGNOSIS — R350 Frequency of micturition: Secondary | ICD-10-CM | POA: Diagnosis not present

## 2023-04-19 DIAGNOSIS — M5416 Radiculopathy, lumbar region: Secondary | ICD-10-CM | POA: Diagnosis not present

## 2023-04-20 ENCOUNTER — Other Ambulatory Visit: Payer: Self-pay | Admitting: Orthopedic Surgery

## 2023-04-20 DIAGNOSIS — M5440 Lumbago with sciatica, unspecified side: Secondary | ICD-10-CM

## 2023-04-23 ENCOUNTER — Telehealth: Payer: Self-pay

## 2023-04-23 ENCOUNTER — Ambulatory Visit
Admission: RE | Admit: 2023-04-23 | Discharge: 2023-04-23 | Disposition: A | Discharge status: Home / Self Care | Payer: Medicare Other | Source: Ambulatory Visit | Attending: Orthopedic Surgery | Admitting: Orthopedic Surgery

## 2023-04-23 ENCOUNTER — Other Ambulatory Visit: Payer: Self-pay | Admitting: Orthopedic Surgery

## 2023-04-23 DIAGNOSIS — M5440 Lumbago with sciatica, unspecified side: Secondary | ICD-10-CM

## 2023-04-23 DIAGNOSIS — Z981 Arthrodesis status: Secondary | ICD-10-CM | POA: Diagnosis not present

## 2023-04-23 DIAGNOSIS — M545 Low back pain, unspecified: Secondary | ICD-10-CM | POA: Diagnosis not present

## 2023-04-23 NOTE — Telephone Encounter (Signed)
   Name: [name]  DOB: [birth year]  MRN: [id number]  Primary Cardiologist: Kristeen Miss, MD   Preoperative team, please contact this patient and set up a phone call appointment for further preoperative risk assessment. Please obtain consent and complete medication review. Thank you for your help.  I confirm that guidance regarding antiplatelet and oral anticoagulation therapy has been completed and, if necessary, noted below.  Per office protocol, if patient is without any new symptoms or concerns at the time of their virtual visit, she may hold Aspirin for 5-7 days prior to procedure. Please resume Aspirin as soon as possible postprocedure, at the discretion of the surgeon.   I also confirmed the patient resides in the state of [place]. As per [facility] Medical Board telemedicine laws, the patient must reside in the state in which the provider is licensed.   Joylene Grapes, NP 04/23/2023, 4:33 PM  [facility]

## 2023-04-23 NOTE — Telephone Encounter (Signed)
1st attempt to reach pt to schedule tele visit. No answer, LVM. Approval from preop provider to use provider use slot and to have patient start holding ASA on 04/27/16

## 2023-04-23 NOTE — Telephone Encounter (Signed)
   Pre-operative Risk Assessment    Patient Name: [name]  DOB: Sep 09, 1950 MRN: [id number]     Request for Surgical Clearance    Procedure:  Left sided lumbar 5 sacrum, transformational lumbar interbody fusion and decompression with instrumentation   Date of Surgery:  Clearance 05/05/23                                 Surgeon:  Dr. Estill Bamberg Surgeon's Group or Practice Name:  [facility]  Phone number:  [phone] Fax number:  [phone]   Type of Clearance Requested:   - Pharmacy:  Hold Aspirin     Type of Anesthesia:  Not Indicated   Additional requests/questions:    SignedVernard Gambles   04/23/2023, 4:04 PM

## 2023-04-26 ENCOUNTER — Ambulatory Visit: Payer: Medicare Other

## 2023-04-26 ENCOUNTER — Telehealth: Payer: Self-pay | Admitting: *Deleted

## 2023-04-26 NOTE — Telephone Encounter (Signed)
Pt has been added on for tele pre op appt ok per Brunilda Payor NP and Eula Fried. NP due to procedure date and med hold.     Patient Consent for Virtual Visit        [name] has provided verbal consent on 04/26/2023 for a virtual visit (video or telephone).   CONSENT FOR VIRTUAL VISIT FOR:  [name]  By participating in this virtual visit I agree to the following:  I hereby voluntarily request, consent and authorize [facility] and its employed or contracted physicians, physician assistants, nurse practitioners or other licensed health care professionals (the Practitioner), to provide me with telemedicine health care services (the "Services") as deemed necessary by the treating Practitioner. I acknowledge and consent to receive the Services by the Practitioner via telemedicine. I understand that the telemedicine visit will involve communicating with the Practitioner through live audiovisual communication technology and the disclosure of certain medical information by electronic transmission. I acknowledge that I have been given the opportunity to request an in-person assessment or other available alternative prior to the telemedicine visit and am voluntarily participating in the telemedicine visit.  I understand that I have the right to withhold or withdraw my consent to the use of telemedicine in the course of my care at any time, without affecting my right to future care or treatment, and that the Practitioner or I may terminate the telemedicine visit at any time. I understand that I have the right to inspect all information obtained and/or recorded in the course of the telemedicine visit and may receive copies of available information for a reasonable fee.  I understand that some of the potential risks of receiving the Services via telemedicine include:  Delay or interruption in medical evaluation due to technological equipment failure or disruption; Information  transmitted may not be sufficient (e.g. poor resolution of images) to allow for appropriate medical decision making by the Practitioner; and/or  In rare instances, security protocols could fail, causing a breach of personal health information.  Furthermore, I acknowledge that it is my responsibility to provide information about my medical history, conditions and care that is complete and accurate to the best of my ability. I acknowledge that Practitioner's advice, recommendations, and/or decision may be based on factors not within their control, such as incomplete or inaccurate data provided by me or distortions of diagnostic images or specimens that may result from electronic transmissions. I understand that the practice of medicine is not an exact science and that Practitioner makes no warranties or guarantees regarding treatment outcomes. I acknowledge that a copy of this consent can be made available to me via my patient portal [facility] MyChart), or I can request a printed copy by calling the office of [facility].    I understand that my insurance will be billed for this visit.   I have read or had this consent read to me. I understand the contents of this consent, which adequately explains the benefits and risks of the Services being provided via telemedicine.  I have been provided ample opportunity to ask questions regarding this consent and the Services and have had my questions answered to my satisfaction. I give my informed consent for the services to be provided through the use of telemedicine in my medical care

## 2023-04-26 NOTE — Telephone Encounter (Signed)
Pt has been added on for tele pre op appt ok per Brunilda Payor NP and Eula Fried. NP due to procedure date and med hold.

## 2023-04-26 NOTE — Telephone Encounter (Signed)
Per the pre op APP today Rachael Bridegroom, NP stated the pt needs in office appt and to disregard that she could have tele appt. Pt last seen 12/2021.   I left a message for the surgery scheduler [name] pt will need in office appt. Our office will do our best to get the pt in an appt in time.   I s/w the pt and she is agreeable to in office appt 04/28/23 @ 3:10 with [name], NP. I will update all parties involved.

## 2023-04-26 NOTE — Telephone Encounter (Signed)
Follow Up:     Patient is returning a call from Friday.

## 2023-04-26 NOTE — Progress Notes (Signed)
Evaluation Performed:  Preoperative cardiovascular risk assessment _____________   Date:  04/26/2023   Patient ID:  [name], [name] 04-22-1951, MRN [id number] Patient Location:  Home Provider location:   Office  Primary Care Provider:  Noberto Retort, MD Primary Cardiologist:  Kristeen Miss, MD  Chief Complaint / Patient Profile   72 y.o. y/o female with a h/o  who is pending left sided lumbar 5 sacral 1, transforaminal lumbar interbody fusion and decompression with instrumentation and presents today for telephonic preoperative cardiovascular risk assessment.  History of Present Illness    [name] is a 72 y.o. female who presents via audio/video conferencing for a telehealth visit today.  Pt was last seen in cardiology clinic on  by .  At that time [name] was doing well .  The patient is now pending procedure as outlined above. Since her last visit, she   Past Medical History    Past Medical History:  Diagnosis Date   Anxiety    Arthritis    Depression    DM (diabetes mellitus) (HCC)    Diet controlled   Dysrhythmia    SVT   Goiter    neg biopsy   Hyperlipidemia    Hypertension    Migraine headache    PONV (postoperative nausea and vomiting)    SVT (supraventricular tachycardia) (HCC)    Ulcerative colitis (HCC)    Past Surgical History:  Procedure Laterality Date   ABDOMINAL HYSTERECTOMY     ACHILLES TENDON REPAIR     APPENDECTOMY     CARDIOVASCULAR STRESS TEST  11/05/2003   EF 68%   CHOLECYSTECTOMY     DOPPLER ECHOCARDIOGRAPHY  02/11/2000   EF 60-65%   FOOT SURGERY     JOINT REPLACEMENT     NOSE SURGERY     TONSILLECTOMY     TOTAL KNEE ARTHROPLASTY     right knee   TRANSFORAMINAL LUMBAR INTERBODY FUSION (TLIF) WITH PEDICLE SCREW FIXATION 2 LEVEL Right 11/06/2020   Procedure: RIGHT-SIDED LUMBAR 3-4, LUMBAR 4-5 TRANSFORAMINAL LUMBAR INTERBODY FUSION WITH INSTRUMENTATION AND ALLOGRAFT;  Surgeon: Estill Bamberg, MD;  Location: [facility]  OR;  Service: Orthopedics;  Laterality: Right;    Allergies  Allergies  Allergen Reactions   Topamax [Topiramate] Anxiety   Amitriptyline     Just makes me feel sick    Amoxicillin Hives   Biaxin [Clarithromycin] Hives   Codeine Nausea And Vomiting    Feel sick    Crestor [Rosuvastatin Calcium]     ARM PAIN   Doxycycline Hives   Gabapentin Diarrhea   Lexapro [Escitalopram]     Can not remember reaction    Penicillins Hives   Pravachol     ARM PAIN  *Pravastatin    Protonix [Pantoprazole]     Can not remember reaction    Ranitidine Hcl     Can not remember reaction   No longer on the market   Spironolactone     Can not remember reaction - No longer needed     Home Medications    Prior to Admission medications   Medication Sig Start Date End Date Taking? Authorizing Provider  ALPRAZolam Prudy Feeler) 0.5 MG tablet Take 0.5 mg by mouth at bedtime as needed for anxiety or sleep.    [provider]  aspirin EC 81 MG tablet Take 1 tablet (81 mg total) by mouth daily. Swallow whole. 12/15/21   Azucena Fallen, MD  atorvastatin (LIPITOR) 20 MG tablet Take 20  mg by mouth daily. 08/03/16   [provider]  colestipol (COLESTID) 1 g tablet Take 2 g by mouth 2 (two) times daily.    [provider]  diphenhydrAMINE (BENADRYL) 25 MG tablet Take 25 mg by mouth at bedtime.    [provider]  esomeprazole (NEXIUM) 20 MG capsule Take 20 mg by mouth daily as needed (heartburn).    [provider]  metoprolol (TOPROL-XL) 100 MG 24 hr tablet Take 50 mg by mouth at bedtime.    [provider]  mirabegron ER (MYRBETRIQ) 50 MG TB24 tablet Take 50 mg by mouth daily.    [provider]  Multiple Vitamins-Minerals (MULTIVITAMIN WITH MINERALS) tablet Take 1 tablet by mouth daily.    [provider]  Probiotic Product (PRO-BIOTIC BLEND PO) Take 1 capsule by mouth at bedtime.    [provider]  propranolol (INDERAL) 10  MG tablet Take 10 mg by mouth daily as needed (For SVT episodes). 03/27/11   Nahser, Deloris Ping, MD  rizatriptan (MAXALT) 10 MG tablet Take 10 mg by mouth daily as needed for migraine. May repeat in 2 hours if needed. For migraines    [provider]  sertraline (ZOLOFT) 100 MG tablet Take 200 mg by mouth daily.    [provider]  traMADol (ULTRAM) 50 MG tablet Take 50 mg by mouth at bedtime. May take an additional dose if needed throughout the day 01/25/18   [provider]  zolpidem (AMBIEN) 10 MG tablet Take 10 mg by mouth at bedtime.    [provider]    Physical Exam    Vital Signs:  [name] does not have vital signs available for review today.  Given telephonic nature of communication, physical exam is limited. AAOx3. NAD. Normal affect.  Speech and respirations are unlabored.  Accessory Clinical Findings    None  Assessment & Plan    1.  Preoperative Cardiovascular Risk Assessment:  The patient was advised that if she develops new symptoms prior to surgery to contact our office to arrange for a follow-up visit, and she verbalized understanding.  A copy of this note will be routed to requesting surgeon.  Time:   Today, I have spent  minutes with the patient with telehealth technology discussing medical history, symptoms, and management plan.     Levi Aland, NP  04/26/2023, 12:07 PM

## 2023-04-27 NOTE — Progress Notes (Unsigned)
Cardiology Office Note    Patient Name: [name] Date of Encounter: 04/27/2023  Primary Care Provider:  Noberto Retort, MD Primary Cardiologist:  Kristeen Miss, MD Primary Electrophysiologist: None   Past Medical History    Past Medical History:  Diagnosis Date   Anxiety    Arthritis    Depression    DM (diabetes mellitus) (HCC)    Diet controlled   Dysrhythmia    SVT   Goiter    neg biopsy   Hyperlipidemia    Hypertension    Migraine headache    PONV (postoperative nausea and vomiting)    SVT (supraventricular tachycardia) (HCC)    Ulcerative colitis (HCC)     History of Present Illness  [name] is a 72 y.o. female with a PMH of HTN, HLD, DM type II, SVT who presents today for preoperative clearance visit.  Ms. Abrigo was seen initially in 2012 by Dr. Elease Hashimoto for preoperative clearance.  She has a remote history of SVT which is managed with propranolol.  She was seen in the ED on 6//24 with complaint of chest soreness and pain.  KG was completed showing no acute changes and troponins were negative.  She underwent an outpatient coronary CTA that showed a calcium score of 80 with minimal nonobstructive CAD present.  She also completed a 2D echo that showed normal EF of 60 to 65% with no RWMA and mild LVH no significant valve abnormalities.  She was last seen by Dr. Elease Hashimoto on 01/05/2022 and was found to have well-controlled blood pressures and reported no additional chest pain episodes.  She is scheduled to undergo a lumbar fusion on 05/05/2023.  During today's visit the patient reports*** .  Patient denies chest pain, palpitations, dyspnea, PND, orthopnea, nausea, vomiting, dizziness, syncope, edema, weight gain, or early satiety.  ***Notes: Patient scheduled to undergo lumbar fusion procedure -Last ischemic evaluation: -Last echo: -Interim ED visits: Review of Systems  Please see the history of present illness.    All other systems reviewed and are  otherwise negative except as noted above.  Physical Exam    Wt Readings from Last 3 Encounters:  01/05/22 231 lb (104.8 kg)  12/14/21 231 lb 14.8 oz (105.2 kg)  11/04/20 227 lb 5 oz (103.1 kg)   ZO:XWRUE were no vitals filed for this visit.,There is no height or weight on file to calculate BMI. GEN: Well nourished, well developed in no acute distress Neck: No JVD; No carotid bruits Pulmonary: Clear to auscultation without rales, wheezing or rhonchi  Cardiovascular: Normal rate. Regular rhythm. Normal S1. Normal S2.   Murmurs: There is no murmur.  ABDOMEN: Soft, non-tender, non-distended EXTREMITIES:  No edema; No deformity   EKG/LABS/ Recent Cardiac Studies   ECG personally reviewed by me today - ***  Risk Assessment/Calculations:   {Does this patient have ATRIAL FIBRILLATION?[phone]}      Lab Results  Component Value Date   WBC 10.8 (H) 12/14/2021   HGB 14.9 12/14/2021   HCT 43.2 12/14/2021   MCV 91.7 12/14/2021   PLT 227 12/14/2021   Lab Results  Component Value Date   CREATININE 0.93 12/14/2021   BUN 19 12/14/2021   NA 139 12/14/2021   K 4.0 12/14/2021   CL 104 12/14/2021   CO2 27 12/14/2021   Lab Results  Component Value Date   CHOL 139 04/07/2022   HDL 49 04/07/2022   LDLCALC 67 04/07/2022   LDLDIRECT 162.2 09/06/2012   TRIG 132 04/07/2022  CHOLHDL 2.8 04/07/2022    Lab Results  Component Value Date   HGBA1C 6.4 (H) 12/14/2021   Assessment & Plan    1.  Preoperative clearance: -Patient's RCRI score is 0.9%  -Patient can hold ASA 81 mg 7 days prior to procedure and should restart postprocedure when surgically safe.  2.  History of SVT: -Patient reports*** -Continue***  3.  Hyperlipidemia: -Patient's last LDL cholesterol was***  4.  Essential hypertension: -Patient's blood pressure today was***      Disposition: Follow-up with Kristeen Miss, MD or APP in *** months {Are you ordering a CV Procedure (e.g. stress test, cath, DCCV, TEE,  etc)?   Press F2        :[id number]}   Signed, Napoleon Form, Leodis Rains, NP 04/27/2023, 3:25 PM [facility]

## 2023-04-28 ENCOUNTER — Ambulatory Visit: Payer: Medicare Other | Attending: Nurse Practitioner | Admitting: Nurse Practitioner

## 2023-04-28 ENCOUNTER — Encounter: Payer: Self-pay | Admitting: Nurse Practitioner

## 2023-04-28 VITALS — BP 112/74 | HR 72 | Ht 66.0 in | Wt 234.6 lb

## 2023-04-28 DIAGNOSIS — H25043 Posterior subcapsular polar age-related cataract, bilateral: Secondary | ICD-10-CM | POA: Diagnosis not present

## 2023-04-28 DIAGNOSIS — H43813 Vitreous degeneration, bilateral: Secondary | ICD-10-CM | POA: Diagnosis not present

## 2023-04-28 DIAGNOSIS — H04123 Dry eye syndrome of bilateral lacrimal glands: Secondary | ICD-10-CM | POA: Diagnosis not present

## 2023-04-28 DIAGNOSIS — H524 Presbyopia: Secondary | ICD-10-CM | POA: Diagnosis not present

## 2023-04-28 DIAGNOSIS — E119 Type 2 diabetes mellitus without complications: Secondary | ICD-10-CM | POA: Diagnosis not present

## 2023-04-28 DIAGNOSIS — I471 Supraventricular tachycardia, unspecified: Secondary | ICD-10-CM | POA: Diagnosis not present

## 2023-04-28 DIAGNOSIS — Z01810 Encounter for preprocedural cardiovascular examination: Secondary | ICD-10-CM | POA: Diagnosis not present

## 2023-04-28 DIAGNOSIS — E782 Mixed hyperlipidemia: Secondary | ICD-10-CM | POA: Diagnosis not present

## 2023-04-28 DIAGNOSIS — H52203 Unspecified astigmatism, bilateral: Secondary | ICD-10-CM | POA: Diagnosis not present

## 2023-04-28 DIAGNOSIS — H5213 Myopia, bilateral: Secondary | ICD-10-CM | POA: Diagnosis not present

## 2023-04-28 DIAGNOSIS — H2513 Age-related nuclear cataract, bilateral: Secondary | ICD-10-CM | POA: Diagnosis not present

## 2023-04-28 DIAGNOSIS — I10 Essential (primary) hypertension: Secondary | ICD-10-CM | POA: Diagnosis not present

## 2023-04-28 NOTE — Patient Instructions (Signed)
Medication Instructions:  HOLD ASPIRIN 7 DAYS PRIOR TO PROCEDURE *If you need a refill on your cardiac medications before your next appointment, please call your pharmacy*   Lab Work: NONE ORDERED   Testing/Procedures: NONE ORDERED   Follow-Up: At [facility], you and your health needs are our priority.  As part of our continuing mission to provide you with exceptional heart care, we have created designated Provider Care Teams.  These Care Teams include your primary Cardiologist (physician) and Advanced Practice Providers (APPs -  Physician Assistants and Nurse Practitioners) who all work together to provide you with the care you need, when you need it.  We recommend signing up for the patient portal called "MyChart".  Sign up information is provided on this After Visit Summary.  MyChart is used to connect with patients for Virtual Visits (Telemedicine).  Patients are able to view lab/test results, encounter notes, upcoming appointments, etc.  Non-urgent messages can be sent to your provider as well.   To learn more about what you can do with MyChart, go to [email or web address].    Your next appointment:   AS NEEDED   Provider:   Kristeen Miss, MD     Other Instructions

## 2023-04-29 NOTE — Pre-Procedure Instructions (Signed)
Surgical Instructions    Your procedure is scheduled on May 05, 2023.  Report to [facility] Main Entrance "A" at 5:30 A.M., then check in with the Admitting office.  Call this number if you have problems the morning of surgery:  [phone]   If you have any questions prior to your surgery date call [phone]: Open Monday-Friday 8am-4pm If you experience any cold or flu symptoms such as cough, fever, chills, shortness of breath, etc. between now and your scheduled surgery, please notify us at the above number     Remember:  Do not eat after midnight the night before your surgery  You may drink clear liquids until 4:30AM the morning of your surgery.   Clear liquids allowed are: Water, Non-Citrus Juices (without pulp), Carbonated Beverages, Clear Tea, Black Coffee ONLY (NO MILK, CREAM OR POWDERED CREAMER of any kind), and Gatorade    Take these medicines the morning of surgery with A SIP OF WATER:  atorvastatin (LIPITOR)  colestipol (COLESTID)  mirabegron ER (MYRBETRIQ)  sertraline (ZOLOFT)   If needed: ALPRAZolam (XANAX)  esomeprazole (NEXIUM)  propranolol (INDERAL)  traMADol (ULTRAM)   Follow your surgeon's instructions on stopping Aspirin. If no instructions were given, please call your surgeon's office.   As of today, STOP taking any Aleve, Naproxen, Ibuprofen, Motrin, Advil, Goody's, BC's, all herbal medications, fish oil, and all vitamins.           WHAT DO I DO ABOUT MY DIABETES MEDICATION?   Do not take oral diabetes medicines (pills) the morning of surgery.   The day of surgery, do not take other diabetes injectables, including Byetta (exenatide), Bydureon (exenatide ER), Victoza (liraglutide), or Trulicity (dulaglutide).  If your CBG is greater than 220 mg/dL, you may take  of your sliding scale (correction) dose of insulin.   HOW TO MANAGE YOUR DIABETES BEFORE AND AFTER SURGERY  Why is it important to control my blood sugar before and after  surgery? Improving blood sugar levels before and after surgery helps healing and can limit problems. A way of improving blood sugar control is eating a healthy diet by:  Eating less sugar and carbohydrates  Increasing activity/exercise  Talking with your doctor about reaching your blood sugar goals High blood sugars (greater than 180 mg/dL) can raise your risk of infections and slow your recovery, so you will need to focus on controlling your diabetes during the weeks before surgery. Make sure that the doctor who takes care of your diabetes knows about your planned surgery including the date and location.  How do I manage my blood sugar before surgery? Check your blood sugar at least 4 times a day, starting 2 days before surgery, to make sure that the level is not too high or low.  Check your blood sugar the morning of your surgery when you wake up and every 2 hours until you get to the Short Stay unit.  If your blood sugar is less than 70 mg/dL, you will need to treat for low blood sugar: Do not take insulin. Treat a low blood sugar (less than 70 mg/dL) with  cup of clear juice (cranberry or apple), 4 glucose tablets, OR glucose gel. Recheck blood sugar in 15 minutes after treatment (to make sure it is greater than 70 mg/dL). If your blood sugar is not greater than 70 mg/dL on recheck, call [phone] for further instructions. Report your blood sugar to the short stay nurse when you get to Short Stay.  If you are admitted  to the hospital after surgery: Your blood sugar will be checked by the staff and you will probably be given insulin after surgery (instead of oral diabetes medicines) to make sure you have good blood sugar levels. The goal for blood sugar control after surgery is 80-180 mg/dL.   [facility] is not responsible for any belongings or valuables.    Do NOT Smoke (Tobacco/Vaping)  24 hours prior to your procedure  If you use a CPAP at night, you may bring your mask for  your overnight stay.   Contacts, glasses, hearing aids, dentures or partials may not be worn into surgery, please bring cases for these belongings   For patients admitted to the hospital, discharge time will be determined by your treatment team.   Patients discharged the day of surgery will not be allowed to drive home, and someone needs to stay with them for 24 hours.   SURGICAL WAITING ROOM VISITATION Patients having surgery or a procedure may have no more than 2 support people in the waiting area - these visitors may rotate.   Children under the age of 79 must have an adult with them who is not the patient. If the patient needs to stay at the hospital during part of their recovery, the visitor guidelines for inpatient rooms apply. Pre-op nurse will coordinate an appropriate time for 1 support person to accompany patient in pre-op.  This support person may not rotate.   Please refer to [email or web address] for the visitor guidelines for Inpatients (after your surgery is over and you are in a regular room).    Special instructions:    Oral Hygiene is also important to reduce your risk of infection.  Remember - BRUSH YOUR TEETH THE MORNING OF SURGERY WITH YOUR REGULAR TOOTHPASTE     Pre-operative 5 CHG Bath Instructions   You can play a key role in reducing the risk of infection after surgery. Your skin needs to be as free of germs as possible. You can reduce the number of germs on your skin by washing with CHG (chlorhexidine gluconate) soap before surgery. CHG is an antiseptic soap that kills germs and continues to kill germs even after washing.   DO NOT use if you have an allergy to chlorhexidine/CHG or antibacterial soaps. If your skin becomes reddened or irritated, stop using the CHG and notify one of our RNs at [phone].   Please shower with the CHG soap starting 4 days before surgery using the following schedule:     Please  keep in mind the following:  DO NOT shave, including legs and underarms, starting the day of your first shower.   You may shave your face at any point before/day of surgery.  Place clean sheets on your bed the day you start using CHG soap. Use a clean washcloth (not used since being washed) for each shower. DO NOT sleep with pets once you start using the CHG.   CHG Shower Instructions:  If you choose to wash your hair and private area, wash first with your normal shampoo/soap.  After you use shampoo/soap, rinse your hair and body thoroughly to remove shampoo/soap residue.  Turn the water OFF and apply about 3 tablespoons (45 ml) of CHG soap to a CLEAN washcloth.  Apply CHG soap ONLY FROM YOUR NECK DOWN TO YOUR TOES (washing for 3-5 minutes)  DO NOT use CHG soap on face, private areas, open wounds, or sores.  Pay special attention to the area where your surgery is being  performed.  If you are having back surgery, having someone wash your back for you may be helpful. Wait 2 minutes after CHG soap is applied, then you may rinse off the CHG soap.  Pat dry with a clean towel  Put on clean clothes/pajamas   If you choose to wear lotion, please use ONLY the CHG-compatible lotions on the back of this paper.     Additional instructions for the day of surgery: DO NOT APPLY any lotions, deodorants, cologne, or perfumes.   Put on clean/comfortable clothes.  Brush your teeth.  Ask your nurse before applying any prescription medications to the skin. Do not wear jewelry or makeup. Do not bring valuables to the hospital. Do not wear nail polish, gel polish, artificial nails, or any other type of covering on natural nails (fingers and toes) If you have artificial nails or gel coating that need to be removed by a nail salon, please have this removed prior to surgery. Artificial nails or gel coating may interfere with anesthesia's ability to adequately monitor your vital signs.     CHG Compatible  Lotions   Aveeno Moisturizing lotion  Cetaphil Moisturizing Cream  Cetaphil Moisturizing Lotion  Clairol Herbal Essence Moisturizing Lotion, Dry Skin  Clairol Herbal Essence Moisturizing Lotion, Extra Dry Skin  Clairol Herbal Essence Moisturizing Lotion, Normal Skin  Curel Age Defying Therapeutic Moisturizing Lotion with Alpha Hydroxy  Curel Extreme Care Body Lotion  Curel Soothing Hands Moisturizing Hand Lotion  Curel Therapeutic Moisturizing Cream, Fragrance-Free  Curel Therapeutic Moisturizing Lotion, Fragrance-Free  Curel Therapeutic Moisturizing Lotion, Original Formula  Eucerin Daily Replenishing Lotion  Eucerin Dry Skin Therapy Plus Alpha Hydroxy Crme  Eucerin Dry Skin Therapy Plus Alpha Hydroxy Lotion  Eucerin Original Crme  Eucerin Original Lotion  Eucerin Plus Crme Eucerin Plus Lotion  Eucerin TriLipid Replenishing Lotion  Keri Anti-Bacterial Hand Lotion  Keri Deep Conditioning Original Lotion Dry Skin Formula Softly Scented  Keri Deep Conditioning Original Lotion, Fragrance Free Sensitive Skin Formula  Keri Lotion Fast Absorbing Fragrance Free Sensitive Skin Formula  Keri Lotion Fast Absorbing Softly Scented Dry Skin Formula  Keri Original Lotion  Keri Skin Renewal Lotion Keri Silky Smooth Lotion  Keri Silky Smooth Sensitive Skin Lotion  Nivea Body Creamy Conditioning Oil  Nivea Body Extra Enriched Lotion  Nivea Body Original Lotion  Nivea Body Sheer Moisturizing Lotion Nivea Crme  Nivea Skin Firming Lotion  NutraDerm 30 Skin Lotion  NutraDerm Skin Lotion  NutraDerm Therapeutic Skin Cream  NutraDerm Therapeutic Skin Lotion  ProShield Protective Hand Cream  Provon moisturizing lotion     If you received a COVID test during your pre-op visit, it is requested that you wear a mask when out in public, stay away from anyone that may not be feeling well, and notify your surgeon if you develop symptoms. If you have been in contact with anyone that has tested  positive in the last 10 days, please notify your surgeon.    Please read over the following fact sheets that you were given.

## 2023-04-30 ENCOUNTER — Encounter (HOSPITAL_COMMUNITY): Payer: Self-pay

## 2023-04-30 ENCOUNTER — Other Ambulatory Visit: Payer: Self-pay

## 2023-04-30 ENCOUNTER — Encounter (HOSPITAL_COMMUNITY)
Admission: RE | Admit: 2023-04-30 | Discharge: 2023-04-30 | Disposition: A | Discharge status: Home / Self Care | Payer: Medicare Other | Source: Ambulatory Visit | Attending: Orthopedic Surgery | Admitting: Orthopedic Surgery

## 2023-04-30 VITALS — BP 119/58 | HR 78 | Temp 98.2°F | Resp 18 | Ht 66.0 in | Wt 234.7 lb

## 2023-04-30 DIAGNOSIS — Z01812 Encounter for preprocedural laboratory examination: Secondary | ICD-10-CM | POA: Diagnosis not present

## 2023-04-30 DIAGNOSIS — M48061 Spinal stenosis, lumbar region without neurogenic claudication: Secondary | ICD-10-CM | POA: Insufficient documentation

## 2023-04-30 DIAGNOSIS — I2510 Atherosclerotic heart disease of native coronary artery without angina pectoris: Secondary | ICD-10-CM | POA: Insufficient documentation

## 2023-04-30 DIAGNOSIS — E669 Obesity, unspecified: Secondary | ICD-10-CM | POA: Insufficient documentation

## 2023-04-30 DIAGNOSIS — K5190 Ulcerative colitis, unspecified, without complications: Secondary | ICD-10-CM | POA: Diagnosis not present

## 2023-04-30 DIAGNOSIS — Z6837 Body mass index (BMI) 37.0-37.9, adult: Secondary | ICD-10-CM | POA: Diagnosis not present

## 2023-04-30 DIAGNOSIS — E119 Type 2 diabetes mellitus without complications: Secondary | ICD-10-CM | POA: Insufficient documentation

## 2023-04-30 DIAGNOSIS — I10 Essential (primary) hypertension: Secondary | ICD-10-CM | POA: Insufficient documentation

## 2023-04-30 DIAGNOSIS — E042 Nontoxic multinodular goiter: Secondary | ICD-10-CM | POA: Insufficient documentation

## 2023-04-30 DIAGNOSIS — Z01818 Encounter for other preprocedural examination: Secondary | ICD-10-CM

## 2023-04-30 DIAGNOSIS — E785 Hyperlipidemia, unspecified: Secondary | ICD-10-CM | POA: Insufficient documentation

## 2023-04-30 LAB — TYPE AND SCREEN
ABO/RH(D): O POS
Antibody Screen: NEGATIVE

## 2023-04-30 LAB — BASIC METABOLIC PANEL
Anion gap: 9 (ref 5–15)
BUN: 18 mg/dL (ref 8–23)
CO2: 27 mmol/L (ref 22–32)
Calcium: 9.6 mg/dL (ref 8.9–10.3)
Chloride: 104 mmol/L (ref 98–111)
Creatinine, Ser: 0.86 mg/dL (ref 0.44–1.00)
GFR, Estimated: >60 mL/min (ref >60)
Glucose, Bld: 114 mg/dL — ABNORMAL HIGH (ref 70–99)
Potassium: 4.4 mmol/L (ref 3.5–5.1)
Sodium: 140 mmol/L (ref 135–145)

## 2023-04-30 LAB — CBC
HCT: 41 % (ref 36.0–46.0)
Hemoglobin: 13.8 g/dL (ref 12.0–15.0)
MCH: 32.1 pg (ref 26.0–34.0)
MCHC: 33.7 g/dL (ref 30.0–36.0)
MCV: 95.3 fL (ref 80.0–100.0)
Platelets: 210 10*3/uL (ref 150–400)
RBC: 4.3 MIL/uL (ref 3.87–5.11)
RDW: 13.2 % (ref 11.5–15.5)
WBC: 6.3 10*3/uL (ref 4.0–10.5)
nRBC: 0 % (ref 0.0–0.2)

## 2023-04-30 LAB — SURGICAL PCR SCREEN
MRSA, PCR: NEGATIVE
Staphylococcus aureus: POSITIVE — AB

## 2023-04-30 LAB — GLUCOSE, CAPILLARY: Glucose-Capillary: 179 mg/dL — ABNORMAL HIGH (ref 70–99)

## 2023-04-30 NOTE — Progress Notes (Addendum)
PCP - Lavada Mesi  Cardiologist - Jannette Spanner  PPM/ICD - denies Device Orders -  Rep Notified -   Chest x-ray - 12/13/21 EKG - 04/28/23 Stress Test -11/05/03  ECHO - 12/14/21 Cardiac Cath - denies  Sleep Study - "years ago"- not diagnosed with sleep apnea CPAP - no  Fasting Blood Sugar - 100 Checks Blood Sugar "occasionally"  Last dose of GLP1 agonist-  na GLP1 instructions:   Blood Thinner Instructions:na Aspirin Instructions:stop 7 days prior to surgery per cardiology(prescriber). Pt reports last dose was 04/28/23.  ERAS Protcol - clear liquids until 0430 PRE-SURGERY Ensure or G2- G2  COVID TEST- na   Anesthesia review: yes- history of SVT;cardiac clearance  Patient denies shortness of breath, fever, cough and chest pain at PAT appointment   All instructions explained to the patient, with a verbal understanding of the material. Patient agrees to go over the instructions while at home for a better understanding. Patient also instructed to self quarantine after being tested for COVID-19. The opportunity to ask questions was provided.

## 2023-04-30 NOTE — Pre-Procedure Instructions (Signed)
Surgical Instructions    Your procedure is scheduled on May 05, 2023.  Report to [facility] Main Entrance "A" at 5:30 A.M., then check in with the Admitting office.  Call this number if you have problems the morning of surgery:  [phone]   If you have any questions prior to your surgery date call [phone]: Open Monday-Friday 8am-4pm If you experience any cold or flu symptoms such as cough, fever, chills, shortness of breath, etc. between now and your scheduled surgery, please notify us at the above number     Remember:  Do not eat after midnight the night before your surgery  You may drink clear liquids until 4:30AM the morning of your surgery.   Clear liquids allowed are: Water, Non-Citrus Juices (without pulp), Carbonated Beverages, Clear Tea, Black Coffee ONLY (NO MILK, CREAM OR POWDERED CREAMER of any kind), and Gatorade Patient Instructions   The day of surgery (if you have diabetes): Drink ONE (1) 12 oz G2 given to you in your pre admission testing appointment by 4:30am the morning of surgery. Drink in one sitting. Do not sip.  This drink was given to you during your hospital  pre-op appointment visit.  Nothing else to drink after completing the  12 oz bottle of G2.        If you have questions, please contact your surgeon's office.     Take these medicines the morning of surgery with A SIP OF WATER:  atorvastatin (LIPITOR)  colestipol (COLESTID)  mirabegron ER (MYRBETRIQ)  sertraline (ZOLOFT)   If needed: ALPRAZolam (XANAX)  esomeprazole (NEXIUM)  propranolol (INDERAL)  traMADol (ULTRAM)   Follow your surgeon's instructions on stopping Aspirin. If no instructions were given, please call your surgeon's office.   As of today, STOP taking any Aleve, Naproxen, Ibuprofen, Motrin, Advil, Goody's, BC's, all herbal medications, fish oil, and all vitamins.           WHAT DO I DO ABOUT MY DIABETES MEDICATION?   Do not take oral diabetes medicines (pills) the  morning of surgery.   The day of surgery, do not take other diabetes injectables, including Byetta (exenatide), Bydureon (exenatide ER), Victoza (liraglutide), or Trulicity (dulaglutide).  If your CBG is greater than 220 mg/dL, you may take  of your sliding scale (correction) dose of insulin.   HOW TO MANAGE YOUR DIABETES BEFORE AND AFTER SURGERY  Why is it important to control my blood sugar before and after surgery? Improving blood sugar levels before and after surgery helps healing and can limit problems. A way of improving blood sugar control is eating a healthy diet by:  Eating less sugar and carbohydrates  Increasing activity/exercise  Talking with your doctor about reaching your blood sugar goals High blood sugars (greater than 180 mg/dL) can raise your risk of infections and slow your recovery, so you will need to focus on controlling your diabetes during the weeks before surgery. Make sure that the doctor who takes care of your diabetes knows about your planned surgery including the date and location.  How do I manage my blood sugar before surgery? Check your blood sugar at least 4 times a day, starting 2 days before surgery, to make sure that the level is not too high or low.  Check your blood sugar the morning of your surgery when you wake up and every 2 hours until you get to the Short Stay unit.  If your blood sugar is less than 70 mg/dL, you will need to treat for  low blood sugar: Do not take insulin. Treat a low blood sugar (less than 70 mg/dL) with  cup of clear juice (cranberry or apple), 4 glucose tablets, OR glucose gel. Recheck blood sugar in 15 minutes after treatment (to make sure it is greater than 70 mg/dL). If your blood sugar is not greater than 70 mg/dL on recheck, call [phone] for further instructions. Report your blood sugar to the short stay nurse when you get to Short Stay.  If you are admitted to the hospital after surgery: Your blood sugar will  be checked by the staff and you will probably be given insulin after surgery (instead of oral diabetes medicines) to make sure you have good blood sugar levels. The goal for blood sugar control after surgery is 80-180 mg/dL.   [facility] is not responsible for any belongings or valuables.    Do NOT Smoke (Tobacco/Vaping)  24 hours prior to your procedure  If you use a CPAP at night, you may bring your mask for your overnight stay.   Contacts, glasses, hearing aids, dentures or partials may not be worn into surgery, please bring cases for these belongings   For patients admitted to the hospital, discharge time will be determined by your treatment team.   Patients discharged the day of surgery will not be allowed to drive home, and someone needs to stay with them for 24 hours.   SURGICAL WAITING ROOM VISITATION Patients having surgery or a procedure may have no more than 2 support people in the waiting area - these visitors may rotate.   Children under the age of 67 must have an adult with them who is not the patient. If the patient needs to stay at the hospital during part of their recovery, the visitor guidelines for inpatient rooms apply. Pre-op nurse will coordinate an appropriate time for 1 support person to accompany patient in pre-op.  This support person may not rotate.   Please refer to [email or web address] for the visitor guidelines for Inpatients (after your surgery is over and you are in a regular room).    Special instructions:    Oral Hygiene is also important to reduce your risk of infection.  Remember - BRUSH YOUR TEETH THE MORNING OF SURGERY WITH YOUR REGULAR TOOTHPASTE     Pre-operative 5 CHG Bath Instructions   You can play a key role in reducing the risk of infection after surgery. Your skin needs to be as free of germs as possible. You can reduce the number of germs on your skin by washing with CHG (chlorhexidine  gluconate) soap before surgery. CHG is an antiseptic soap that kills germs and continues to kill germs even after washing.   DO NOT use if you have an allergy to chlorhexidine/CHG or antibacterial soaps. If your skin becomes reddened or irritated, stop using the CHG and notify one of our RNs at [phone].   Please shower with the CHG soap starting 4 days before surgery using the following schedule:     Please keep in mind the following:  DO NOT shave, including legs and underarms, starting the day of your first shower.   You may shave your face at any point before/day of surgery.  Place clean sheets on your bed the day you start using CHG soap. Use a clean washcloth (not used since being washed) for each shower. DO NOT sleep with pets once you start using the CHG.   CHG Shower Instructions:  If you choose to wash your hair  and private area, wash first with your normal shampoo/soap.  After you use shampoo/soap, rinse your hair and body thoroughly to remove shampoo/soap residue.  Turn the water OFF and apply about 3 tablespoons (45 ml) of CHG soap to a CLEAN washcloth.  Apply CHG soap ONLY FROM YOUR NECK DOWN TO YOUR TOES (washing for 3-5 minutes)  DO NOT use CHG soap on face, private areas, open wounds, or sores.  Pay special attention to the area where your surgery is being performed.  If you are having back surgery, having someone wash your back for you may be helpful. Wait 2 minutes after CHG soap is applied, then you may rinse off the CHG soap.  Pat dry with a clean towel  Put on clean clothes/pajamas   If you choose to wear lotion, please use ONLY the CHG-compatible lotions on the back of this paper.     Additional instructions for the day of surgery: DO NOT APPLY any lotions, deodorants, cologne, or perfumes.   Put on clean/comfortable clothes.  Brush your teeth.  Ask your nurse before applying any prescription medications to the skin. Do not wear jewelry or makeup. Do not  bring valuables to the hospital. Do not wear nail polish, gel polish, artificial nails, or any other type of covering on natural nails (fingers and toes) If you have artificial nails or gel coating that need to be removed by a nail salon, please have this removed prior to surgery. Artificial nails or gel coating may interfere with anesthesia's ability to adequately monitor your vital signs.     CHG Compatible Lotions   Aveeno Moisturizing lotion  Cetaphil Moisturizing Cream  Cetaphil Moisturizing Lotion  Clairol Herbal Essence Moisturizing Lotion, Dry Skin  Clairol Herbal Essence Moisturizing Lotion, Extra Dry Skin  Clairol Herbal Essence Moisturizing Lotion, Normal Skin  Curel Age Defying Therapeutic Moisturizing Lotion with Alpha Hydroxy  Curel Extreme Care Body Lotion  Curel Soothing Hands Moisturizing Hand Lotion  Curel Therapeutic Moisturizing Cream, Fragrance-Free  Curel Therapeutic Moisturizing Lotion, Fragrance-Free  Curel Therapeutic Moisturizing Lotion, Original Formula  Eucerin Daily Replenishing Lotion  Eucerin Dry Skin Therapy Plus Alpha Hydroxy Crme  Eucerin Dry Skin Therapy Plus Alpha Hydroxy Lotion  Eucerin Original Crme  Eucerin Original Lotion  Eucerin Plus Crme Eucerin Plus Lotion  Eucerin TriLipid Replenishing Lotion  Keri Anti-Bacterial Hand Lotion  Keri Deep Conditioning Original Lotion Dry Skin Formula Softly Scented  Keri Deep Conditioning Original Lotion, Fragrance Free Sensitive Skin Formula  Keri Lotion Fast Absorbing Fragrance Free Sensitive Skin Formula  Keri Lotion Fast Absorbing Softly Scented Dry Skin Formula  Keri Original Lotion  Keri Skin Renewal Lotion Keri Silky Smooth Lotion  Keri Silky Smooth Sensitive Skin Lotion  Nivea Body Creamy Conditioning Oil  Nivea Body Extra Enriched Lotion  Nivea Body Original Lotion  Nivea Body Sheer Moisturizing Lotion Nivea Crme  Nivea Skin Firming Lotion  NutraDerm 30 Skin Lotion  NutraDerm Skin  Lotion  NutraDerm Therapeutic Skin Cream  NutraDerm Therapeutic Skin Lotion  ProShield Protective Hand Cream  Provon moisturizing lotion     If you received a COVID test during your pre-op visit, it is requested that you wear a mask when out in public, stay away from anyone that may not be feeling well, and notify your surgeon if you develop symptoms. If you have been in contact with anyone that has tested positive in the last 10 days, please notify your surgeon.    Please read over the following fact sheets that  you were given.

## 2023-04-30 NOTE — Plan of Care (Signed)
[facility] Tonsillectomy/Adenoidectomy, Postoperative PEDS care plan entered in error.

## 2023-05-03 NOTE — Progress Notes (Signed)
Anesthesia Chart Review:  Case: [id number] Date/Time: 05/05/23 0715   Procedure: LEFT-SIDED LUMBAR 5 - SACRUM 1 TRANSFORAMINAL LUMBAR INTERBODY FUSION AND DECOMPRESSION WITH INSTRUMENTATION AND ALLOGRAFT (Left)   Anesthesia type: General   Pre-op diagnosis: LUMBAR STENOSIS   [facility] ROOM 05 / [facility]   Surgeons: Estill Bamberg, MD       DISCUSSION: Patient is a 72 year old female scheduled for the above procedure.  History includes never smoker, post-operative N/V, SVT (on b-blocker therapy), HTN, HLD, diet controlled DM, ulcerative colitis, goiter (multinodular goiter, with benign cytology per 01/26/17 ENT note by Serena Colonel, MD), osteoarthritis (right TKA 03/23/11), spinal surgery (L3-5 TLIF/posterolateral fusion 11/06/20). 01/02/22 CCTA showed Coronary calcium score of 80.1 (72nd percentile) with minimal CAD < 25% LAD and RCA.  BMI is consistent with obesity.  She had preoperative evaluation by Neila Gear, NP on 04/28/23. She had cardiology evaluation with Dr. Celesta Aver in June 2023 following ED visit for chest pain. She subsequently underwent an outpatient coronary CTA that showed a calcium score of 80 with minimal nonobstructive CAD present. 2D echo showed normal EF of 60 to 65% with no RWMA and mild LVH no significant valve abnormalities. At recent evaluation, she was doing well from a cardiac standpoint. He wrote, "Preoperative clearance: -Patient's RCRI score is 0.9% -The patient affirms she has been doing well without any new cardiac symptoms. They are able to achieve 5 METS without cardiac limitations. Therefore, based on ACC/AHA guidelines, the patient would be at acceptable risk for the planned procedure without further cardiovascular testing..." As needed cardiology follow-up recommended.   A1c 6.7% on 04/04/23 in [facility].  Anesthesia team to evaluate on the day of surgery.  She reported last aspirin dose 04/28/2023.   VS: BP (!) 119/58   Pulse 78    Temp 36.8 C (Oral)   Resp 18   Ht 5\' 6"  (1.676 m)   Wt 106.5 kg   SpO2 96%   BMI 37.88 kg/m    PROVIDERS: Noberto Retort, MD is PCP  Kristeen Miss, MD is cardiologist. As needed cardiology follow-up recommended at 04/28/23 APP visit.    LABS: Labs reviewed: Acceptable for surgery. (all labs ordered are listed, but only abnormal results are displayed)  Labs Reviewed  SURGICAL PCR SCREEN - Abnormal; Notable for the following components:      Result Value   Staphylococcus aureus POSITIVE (*)    All other components within normal limits  GLUCOSE, CAPILLARY - Abnormal; Notable for the following components:   Glucose-Capillary 179 (*)    All other components within normal limits  BASIC METABOLIC PANEL - Abnormal; Notable for the following components:   Glucose, Bld 114 (*)    All other components within normal limits  CBC  TYPE AND SCREEN     IMAGES: CT L-spine 04/23/23: Report in progress.    EKG: 04/28/23 ([facility]):  Normal sinus rhythm Right bundle branch block and TWI leads V2 III When compared with ECG of 14-Dec-2021 09:00, T wave inversion now evident in Anterior leads Confirmed by [name] [phone]) on 04/28/2023 3:45:14 PM   CV: CT Coronary 01/02/22: FINDINGS:  - Coronary Arteries:  Normal coronary origin.  Right dominance. - Left main: The left main is a large caliber vessel with a normal take off from the left coronary cusp that trifurcates into a LAD, LCX, and ramus intermedius. There is minimal calcified plaque (<25%). - Left anterior descending artery: There is minimal calcified plaque (<25%) in  the proximal, mid and distal segments. The LAD gives off 1 diagonal with minimal calcified plaque (<25%). - Ramus intermedius: Patent with no evidence of plaque or stenosis. - Left circumflex artery: The LCX is non-dominant and patent with no evidence of plaque or stenosis. The LCX gives off 1 patent obtuse marginal branch. - Right coronary  artery: The RCA is dominant with normal take off from the right coronary cusp. There is minimal mixed density plaque (<25%) in the proximal segment. The mid and distal segments are patent. The RCA terminates as a PDA and right posterolateral branch without evidence of plaque or stenosis. - Right Atrium: Right atrial size is within normal limits. - Right Ventricle: The right ventricular cavity is within normal limits. - Left Atrium: Left atrial size is normal in size with no left atrial appendage filling defect. - Left Ventricle: The ventricular cavity size is within normal limits. - Pulmonary arteries: Normal in size without proximal filling defect. - Pulmonary veins: Normal pulmonary venous drainage. - Pericardium: Normal thickness without significant effusion or calcium present. - Cardiac valves: The aortic valve is trileaflet without significant calcification. The mitral valve is normal without significant calcification. - Aorta: Normal caliber without significant disease.  IMPRESSION: 1. Coronary calcium score of 80.1. This was 72nd percentile for age-, sex, and race-matched controls. 2. Normal coronary origin with right dominance. 3. Minimal CAD (<25%) in the LAD/RCA.   RECOMMENDATIONS: 1. Minimal non-obstructive CAD (0-24%). Consider non-atherosclerotic causes of chest pain. Consider preventive therapy and risk factor modification.   Echo 12/14/21: IMPRESSIONS   1. Left ventricular ejection fraction, by estimation, is 60 to 65%. The  left ventricle has normal function. The left ventricle has no regional  wall motion abnormalities. There is mild left ventricular hypertrophy.  Left ventricular diastolic parameters  are indeterminate.   2. Right ventricular systolic function is normal. The right ventricular  size is mildly enlarged. There is normal pulmonary artery systolic  pressure. The estimated right ventricular systolic pressure is 26.8 mmHg.   3. The mitral valve is  normal in structure. Trivial mitral valve  regurgitation. No evidence of mitral stenosis.   4. The aortic valve is tricuspid. Aortic valve regurgitation is not  visualized. No aortic stenosis is present.   5. The inferior vena cava is normal in size with greater than 50%  respiratory variability, suggesting right atrial pressure of 3 mmHg.    Past Medical History:  Diagnosis Date   Anxiety    Arthritis    Depression    DM (diabetes mellitus) (HCC)    Diet controlled   Dysrhythmia    SVT   Goiter    neg biopsy   Hyperlipidemia    Hypertension    Migraine headache    PONV (postoperative nausea and vomiting)    SVT (supraventricular tachycardia) (HCC)    Ulcerative colitis (HCC)     Past Surgical History:  Procedure Laterality Date   ABDOMINAL HYSTERECTOMY     ACHILLES TENDON REPAIR Right    x2   APPENDECTOMY     CARDIOVASCULAR STRESS TEST  11/05/2003   EF 68%   CHOLECYSTECTOMY     DOPPLER ECHOCARDIOGRAPHY  02/11/2000   EF 60-65%   FOOT SURGERY     JOINT REPLACEMENT     NOSE SURGERY     TONSILLECTOMY     TOTAL KNEE ARTHROPLASTY     right knee   TRANSFORAMINAL LUMBAR INTERBODY FUSION (TLIF) WITH PEDICLE SCREW FIXATION 2 LEVEL Right 11/06/2020   Procedure:  RIGHT-SIDED LUMBAR 3-4, LUMBAR 4-5 TRANSFORAMINAL LUMBAR INTERBODY FUSION WITH INSTRUMENTATION AND ALLOGRAFT;  Surgeon: Estill Bamberg, MD;  Location: [facility];  Service: Orthopedics;  Laterality: Right;    MEDICATIONS:  ALPRAZolam (XANAX) 0.5 MG tablet   aspirin EC 81 MG tablet   atorvastatin (LIPITOR) 20 MG tablet   colestipol (COLESTID) 1 g tablet   diphenhydrAMINE (BENADRYL) 25 MG tablet   esomeprazole (NEXIUM) 20 MG capsule   metoprolol (TOPROL-XL) 100 MG 24 hr tablet   mirabegron ER (MYRBETRIQ) 50 MG TB24 tablet   Multiple Vitamins-Minerals (MULTIVITAMIN WITH MINERALS) tablet   Probiotic Product (PRO-BIOTIC BLEND PO)   Probiotic, Lactobacillus, CAPS   propranolol (INDERAL) 10 MG tablet   rizatriptan  (MAXALT) 10 MG tablet   sertraline (ZOLOFT) 100 MG tablet   traMADol (ULTRAM) 50 MG tablet   zolpidem (AMBIEN) 10 MG tablet   No current facility-administered medications for this encounter.    Shonna Chock, PA-C Surgical Short Stay/Anesthesiology [facility] Phone [phone] [facility] Phone [phone] 05/03/2023 12:14 PM

## 2023-05-03 NOTE — Anesthesia Preprocedure Evaluation (Signed)
Anesthesia Evaluation  Patient identified by MRN, date of birth, ID band Patient awake    Reviewed: Allergy & Precautions, NPO status , Patient's Chart, lab work & pertinent test results  History of Anesthesia Complications (+) PONV and history of anesthetic complications  Airway Mallampati: II  TM Distance: >3 FB Neck ROM: Full    Dental no notable dental hx.    Pulmonary neg pulmonary ROS   Pulmonary exam normal        Cardiovascular hypertension, Pt. on medications and Pt. on home beta blockers Normal cardiovascular exam+ dysrhythmias Supra Ventricular Tachycardia   TTE 2023: EF 60-65%, mild LVH, mild RVE   Neuro/Psych  Headaches  Anxiety Depression    Lumbar stenosis    GI/Hepatic Neg liver ROS, PUD,GERD  Medicated,,UC   Endo/Other  diabetes, Type 2    Renal/GU negative Renal ROS     Musculoskeletal  (+) Arthritis ,    Abdominal   Peds  Hematology negative hematology ROS (+)   Anesthesia Other Findings Day of surgery medications reviewed with patient.  Reproductive/Obstetrics                              Anesthesia Physical Anesthesia Plan  ASA: 2  Anesthesia Plan: General   Post-op Pain Management: Tylenol PO (pre-op)*, Ketamine IV* and Dilaudid IV   Induction: Intravenous  PONV Risk Score and Plan: 4 or greater and Treatment may vary due to age or medical condition, Midazolam, Dexamethasone, Ondansetron and Propofol infusion  Airway Management Planned: Oral ETT  Additional Equipment: None  Intra-op Plan:   Post-operative Plan: Extubation in OR  Informed Consent: I have reviewed the patients History and Physical, chart, labs and discussed the procedure including the risks, benefits and alternatives for the proposed anesthesia with the patient or authorized representative who has indicated his/her understanding and acceptance.     Dental advisory given  Plan  Discussed with: CRNA  Anesthesia Plan Comments: (PAT note written 05/03/2023 by Shonna Chock, PA-C.  )        Anesthesia Quick Evaluation

## 2023-05-04 NOTE — Plan of Care (Signed)
[facility] Tonsillectomy/Adenoidectomy, Postoperative PEDS care plan entered in error.

## 2023-05-05 ENCOUNTER — Ambulatory Visit (HOSPITAL_BASED_OUTPATIENT_CLINIC_OR_DEPARTMENT_OTHER): Payer: Medicare Other | Admitting: Anesthesiology

## 2023-05-05 ENCOUNTER — Ambulatory Visit (HOSPITAL_COMMUNITY): Payer: Medicare Other

## 2023-05-05 ENCOUNTER — Other Ambulatory Visit: Payer: Self-pay

## 2023-05-05 ENCOUNTER — Observation Stay (HOSPITAL_COMMUNITY)
Admission: RE | Admit: 2023-05-05 | Discharge: 2023-05-07 | Disposition: A | Discharge status: Home / Self Care | Payer: Medicare Other | Attending: Orthopedic Surgery | Admitting: Orthopedic Surgery

## 2023-05-05 ENCOUNTER — Encounter (HOSPITAL_COMMUNITY)
Admission: RE | Disposition: A | Discharge status: Home / Self Care | Payer: Self-pay | Source: Home / Self Care | Attending: Orthopedic Surgery

## 2023-05-05 ENCOUNTER — Encounter (HOSPITAL_COMMUNITY): Payer: Self-pay | Admitting: Orthopedic Surgery

## 2023-05-05 ENCOUNTER — Ambulatory Visit (HOSPITAL_COMMUNITY): Payer: Medicare Other | Admitting: Vascular Surgery

## 2023-05-05 DIAGNOSIS — Z79899 Other long term (current) drug therapy: Secondary | ICD-10-CM | POA: Diagnosis not present

## 2023-05-05 DIAGNOSIS — Z96651 Presence of right artificial knee joint: Secondary | ICD-10-CM | POA: Insufficient documentation

## 2023-05-05 DIAGNOSIS — Z01818 Encounter for other preprocedural examination: Secondary | ICD-10-CM

## 2023-05-05 DIAGNOSIS — M48061 Spinal stenosis, lumbar region without neurogenic claudication: Secondary | ICD-10-CM | POA: Diagnosis not present

## 2023-05-05 DIAGNOSIS — M5410 Radiculopathy, site unspecified: Principal | ICD-10-CM | POA: Diagnosis present

## 2023-05-05 DIAGNOSIS — M4807 Spinal stenosis, lumbosacral region: Secondary | ICD-10-CM | POA: Diagnosis not present

## 2023-05-05 DIAGNOSIS — Z7982 Long term (current) use of aspirin: Secondary | ICD-10-CM | POA: Insufficient documentation

## 2023-05-05 DIAGNOSIS — E119 Type 2 diabetes mellitus without complications: Secondary | ICD-10-CM | POA: Insufficient documentation

## 2023-05-05 DIAGNOSIS — M5416 Radiculopathy, lumbar region: Secondary | ICD-10-CM | POA: Diagnosis not present

## 2023-05-05 DIAGNOSIS — I10 Essential (primary) hypertension: Secondary | ICD-10-CM | POA: Diagnosis not present

## 2023-05-05 DIAGNOSIS — Z9689 Presence of other specified functional implants: Secondary | ICD-10-CM | POA: Diagnosis not present

## 2023-05-05 HISTORY — PX: TRANSFORAMINAL LUMBAR INTERBODY FUSION (TLIF) WITH PEDICLE SCREW FIXATION 1 LEVEL: SHX6141

## 2023-05-05 LAB — GLUCOSE, CAPILLARY
Glucose-Capillary: 123 mg/dL — ABNORMAL HIGH (ref 70–99)
Glucose-Capillary: 165 mg/dL — ABNORMAL HIGH (ref 70–99)
Glucose-Capillary: 195 mg/dL — ABNORMAL HIGH (ref 70–99)
Glucose-Capillary: 135 mg/dL — ABNORMAL HIGH (ref 70–99)

## 2023-05-05 SURGERY — TRANSFORAMINAL LUMBAR INTERBODY FUSION (TLIF) WITH PEDICLE SCREW FIXATION 1 LEVEL
Anesthesia: General | Laterality: Left

## 2023-05-05 MED ORDER — PROPOFOL 10 MG/ML IV BOLUS
INTRAVENOUS | Status: AC
  Filled 2023-05-05: qty 20

## 2023-05-05 MED ORDER — FLEET ENEMA RE ENEM: 1.0000 | ENEMA | Freq: Once | RECTAL | Status: DC | PRN

## 2023-05-05 MED ORDER — ONDANSETRON HCL 4 MG/2ML IJ SOLN
INTRAMUSCULAR | Status: AC
  Filled 2023-05-05: qty 2

## 2023-05-05 MED ORDER — ROCURONIUM BROMIDE 10 MG/ML (PF) SYRINGE
PREFILLED_SYRINGE | INTRAVENOUS | Status: AC
  Filled 2023-05-05: qty 10

## 2023-05-05 MED ORDER — PROBIOTIC (LACTOBACILLUS) PO CAPS: 1.0000 | ORAL_CAPSULE | Freq: Every day | ORAL | Status: DC

## 2023-05-05 MED ORDER — PHENYLEPHRINE 80 MCG/ML (10ML) SYRINGE FOR IV PUSH (FOR BLOOD PRESSURE SUPPORT)
PREFILLED_SYRINGE | INTRAVENOUS | Status: DC | PRN
  Administered 2023-05-05: 240 ug via INTRAVENOUS
  Administered 2023-05-05 (×2): 160 ug via INTRAVENOUS
  Administered 2023-05-05: 80 ug via INTRAVENOUS

## 2023-05-05 MED ORDER — MORPHINE SULFATE (PF) 2 MG/ML IV SOLN: 1.0000 mg | INTRAVENOUS | Status: DC | PRN

## 2023-05-05 MED ORDER — ALBUMIN HUMAN 5 % IV SOLN
INTRAVENOUS | Status: AC
  Filled 2023-05-05: qty 250

## 2023-05-05 MED ORDER — ALBUMIN HUMAN 5 % IV SOLN
12.5000 g | Freq: Once | INTRAVENOUS | Status: AC
  Administered 2023-05-05: 12.5 g via INTRAVENOUS

## 2023-05-05 MED ORDER — ATORVASTATIN CALCIUM 10 MG PO TABS
20.0000 mg | ORAL_TABLET | Freq: Every day | ORAL | Status: DC
  Administered 2023-05-06 – 2023-05-07 (×2): 20 mg via ORAL
  Filled 2023-05-05 (×2): qty 2

## 2023-05-05 MED ORDER — PHENYLEPHRINE 80 MCG/ML (10ML) SYRINGE FOR IV PUSH (FOR BLOOD PRESSURE SUPPORT)
PREFILLED_SYRINGE | INTRAVENOUS | Status: AC
  Filled 2023-05-05: qty 10

## 2023-05-05 MED ORDER — ONDANSETRON HCL 4 MG PO TABS: 4.0000 mg | ORAL_TABLET | Freq: Four times a day (QID) | ORAL | Status: DC | PRN

## 2023-05-05 MED ORDER — INSULIN ASPART 100 UNIT/ML IJ SOLN
0.0000 [IU] | INTRAMUSCULAR | Status: DC | PRN
Start: 2023-05-05 — End: 2023-05-05

## 2023-05-05 MED ORDER — FENTANYL CITRATE (PF) 250 MCG/5ML IJ SOLN
INTRAMUSCULAR | Status: AC
  Filled 2023-05-05: qty 5

## 2023-05-05 MED ORDER — CEFAZOLIN SODIUM-DEXTROSE 2-4 GM/100ML-% IV SOLN
2.0000 g | INTRAVENOUS | Status: AC
  Administered 2023-05-05: 2 g via INTRAVENOUS
  Filled 2023-05-05: qty 100

## 2023-05-05 MED ORDER — ZOLPIDEM TARTRATE 5 MG PO TABS
5.0000 mg | ORAL_TABLET | Freq: Every day | ORAL | Status: DC
  Administered 2023-05-05: 5 mg via ORAL
  Filled 2023-05-05: qty 1

## 2023-05-05 MED ORDER — MIRABEGRON ER 50 MG PO TB24
50.0000 mg | ORAL_TABLET | Freq: Every day | ORAL | Status: DC
  Administered 2023-05-05 – 2023-05-07 (×3): 50 mg via ORAL
  Filled 2023-05-05 (×3): qty 1

## 2023-05-05 MED ORDER — ONDANSETRON HCL 4 MG/2ML IJ SOLN: 4.0000 mg | Freq: Four times a day (QID) | INTRAMUSCULAR | Status: DC | PRN

## 2023-05-05 MED ORDER — METHOCARBAMOL 1000 MG/10ML IJ SOLN: 500.0000 mg | Freq: Four times a day (QID) | INTRAMUSCULAR | Status: DC | PRN

## 2023-05-05 MED ORDER — BUPIVACAINE LIPOSOME 1.3 % IJ SUSP
INTRAMUSCULAR | Status: AC
  Filled 2023-05-05: qty 20

## 2023-05-05 MED ORDER — ALPRAZOLAM 0.5 MG PO TABS: 0.5000 mg | ORAL_TABLET | Freq: Every evening | ORAL | Status: DC | PRN

## 2023-05-05 MED ORDER — HYDROMORPHONE HCL 1 MG/ML IJ SOLN
0.2500 mg | INTRAMUSCULAR | Status: DC | PRN
  Administered 2023-05-05 (×2): 0.5 mg via INTRAVENOUS

## 2023-05-05 MED ORDER — THROMBIN 20000 UNITS EX SOLR
CUTANEOUS | Status: AC
  Filled 2023-05-05: qty 20000

## 2023-05-05 MED ORDER — METHOCARBAMOL 500 MG PO TABS: 500.0000 mg | ORAL_TABLET | Freq: Four times a day (QID) | ORAL | 2 refills | Status: DC | PRN

## 2023-05-05 MED ORDER — SODIUM CHLORIDE 0.9% FLUSH: 3.0000 mL | INTRAVENOUS | Status: DC | PRN

## 2023-05-05 MED ORDER — SODIUM CHLORIDE 0.9% FLUSH
10.0000 mL | Freq: Two times a day (BID) | INTRAVENOUS | Status: DC
Start: 2023-05-05 — End: 2023-05-07
  Administered 2023-05-07: 10 mL via INTRAVENOUS

## 2023-05-05 MED ORDER — METOPROLOL SUCCINATE ER 50 MG PO TB24
50.0000 mg | ORAL_TABLET | Freq: Every day | ORAL | Status: DC
  Administered 2023-05-05 – 2023-05-06 (×2): 50 mg via ORAL
  Filled 2023-05-05 (×2): qty 1

## 2023-05-05 MED ORDER — ONDANSETRON HCL 4 MG/2ML IJ SOLN
INTRAMUSCULAR | Status: DC | PRN
  Administered 2023-05-05: 4 mg via INTRAVENOUS

## 2023-05-05 MED ORDER — BISACODYL 5 MG PO TBEC
5.0000 mg | DELAYED_RELEASE_TABLET | Freq: Every day | ORAL | Status: DC | PRN
Start: 2023-05-05 — End: 2023-05-07

## 2023-05-05 MED ORDER — MIDAZOLAM HCL 2 MG/2ML IJ SOLN
INTRAMUSCULAR | Status: AC
  Filled 2023-05-05: qty 2

## 2023-05-05 MED ORDER — RISAQUAD PO CAPS
1.0000 | ORAL_CAPSULE | Freq: Every day | ORAL | Status: DC
  Administered 2023-05-05 – 2023-05-07 (×3): 1 via ORAL
  Filled 2023-05-05 (×3): qty 1

## 2023-05-05 MED ORDER — SODIUM CHLORIDE 0.9 % IV SOLN: INTRAVENOUS | Status: DC | PRN

## 2023-05-05 MED ORDER — BUPIVACAINE-EPINEPHRINE (PF) 0.25% -1:200000 IJ SOLN
INTRAMUSCULAR | Status: AC
  Filled 2023-05-05: qty 30

## 2023-05-05 MED ORDER — DIPHENHYDRAMINE HCL 25 MG PO CAPS
25.0000 mg | ORAL_CAPSULE | Freq: Every day | ORAL | Status: DC
  Administered 2023-05-05: 25 mg via ORAL
  Filled 2023-05-05: qty 1

## 2023-05-05 MED ORDER — HYDROMORPHONE HCL 2 MG PO TABS: 1.0000 mg | ORAL_TABLET | ORAL | 0 refills | Status: DC | PRN

## 2023-05-05 MED ORDER — PHENYLEPHRINE HCL-NACL 20-0.9 MG/250ML-% IV SOLN
INTRAVENOUS | Status: DC | PRN
  Administered 2023-05-05: 50 ug/min via INTRAVENOUS

## 2023-05-05 MED ORDER — DEXAMETHASONE SODIUM PHOSPHATE 10 MG/ML IJ SOLN
INTRAMUSCULAR | Status: DC | PRN
  Administered 2023-05-05: 10 mg via INTRAVENOUS

## 2023-05-05 MED ORDER — LIDOCAINE 2% (20 MG/ML) 5 ML SYRINGE
INTRAMUSCULAR | Status: DC | PRN
  Administered 2023-05-05: 100 mg via INTRAVENOUS

## 2023-05-05 MED ORDER — SUGAMMADEX SODIUM 200 MG/2ML IV SOLN
INTRAVENOUS | Status: DC | PRN
  Administered 2023-05-05: 200 mg via INTRAVENOUS

## 2023-05-05 MED ORDER — COLESTIPOL HCL 1 G PO TABS
2.0000 g | ORAL_TABLET | Freq: Two times a day (BID) | ORAL | Status: DC
  Administered 2023-05-05 – 2023-05-07 (×4): 2 g via ORAL
  Filled 2023-05-05 (×6): qty 2

## 2023-05-05 MED ORDER — FENTANYL CITRATE (PF) 250 MCG/5ML IJ SOLN
INTRAMUSCULAR | Status: DC | PRN
  Administered 2023-05-05: 50 ug via INTRAVENOUS
  Administered 2023-05-05: 100 ug via INTRAVENOUS
  Administered 2023-05-05: 50 ug via INTRAVENOUS

## 2023-05-05 MED ORDER — 0.9 % SODIUM CHLORIDE (POUR BTL) OPTIME
TOPICAL | Status: DC | PRN
  Administered 2023-05-05 (×4): 1000 mL

## 2023-05-05 MED ORDER — ROCURONIUM BROMIDE 10 MG/ML (PF) SYRINGE
PREFILLED_SYRINGE | INTRAVENOUS | Status: DC | PRN
  Administered 2023-05-05 (×4): 10 mg via INTRAVENOUS
  Administered 2023-05-05: 70 mg via INTRAVENOUS

## 2023-05-05 MED ORDER — DOCUSATE SODIUM 100 MG PO CAPS
100.0000 mg | ORAL_CAPSULE | Freq: Two times a day (BID) | ORAL | Status: DC
  Administered 2023-05-05 – 2023-05-07 (×5): 100 mg via ORAL
  Filled 2023-05-05 (×5): qty 1

## 2023-05-05 MED ORDER — SODIUM CHLORIDE 0.9% FLUSH
10.0000 mL | Freq: Two times a day (BID) | INTRAVENOUS | Status: DC
Start: 2023-05-05 — End: 2023-05-07

## 2023-05-05 MED ORDER — DROPERIDOL 2.5 MG/ML IJ SOLN: 0.6250 mg | Freq: Once | INTRAMUSCULAR | Status: DC | PRN

## 2023-05-05 MED ORDER — ALUM & MAG HYDROXIDE-SIMETH 200-200-20 MG/5ML PO SUSP: 30.0000 mL | Freq: Four times a day (QID) | ORAL | Status: DC | PRN

## 2023-05-05 MED ORDER — SERTRALINE HCL 100 MG PO TABS
200.0000 mg | ORAL_TABLET | Freq: Every day | ORAL | Status: DC
  Administered 2023-05-06 – 2023-05-07 (×2): 200 mg via ORAL
  Filled 2023-05-05 (×2): qty 2

## 2023-05-05 MED ORDER — HYDROMORPHONE HCL 1 MG/ML IJ SOLN
INTRAMUSCULAR | Status: AC
  Filled 2023-05-05: qty 1

## 2023-05-05 MED ORDER — MENTHOL 3 MG MT LOZG: 1.0000 | LOZENGE | OROMUCOSAL | Status: DC | PRN

## 2023-05-05 MED ORDER — ACETAMINOPHEN 500 MG PO TABS
1000.0000 mg | ORAL_TABLET | Freq: Once | ORAL | Status: AC
  Administered 2023-05-05: 1000 mg via ORAL
  Filled 2023-05-05: qty 2

## 2023-05-05 MED ORDER — SENNOSIDES-DOCUSATE SODIUM 8.6-50 MG PO TABS: 1.0000 | ORAL_TABLET | Freq: Every evening | ORAL | Status: DC | PRN

## 2023-05-05 MED ORDER — CEFAZOLIN SODIUM-DEXTROSE 2-4 GM/100ML-% IV SOLN
2.0000 g | Freq: Three times a day (TID) | INTRAVENOUS | Status: AC
  Administered 2023-05-05 (×2): 2 g via INTRAVENOUS
  Filled 2023-05-05 (×2): qty 100

## 2023-05-05 MED ORDER — BUPIVACAINE LIPOSOME 1.3 % IJ SUSP
INTRAMUSCULAR | Status: DC | PRN
  Administered 2023-05-05: 20 mL

## 2023-05-05 MED ORDER — ACETAMINOPHEN 650 MG RE SUPP
650.0000 mg | RECTAL | Status: DC | PRN
Start: 2023-05-05 — End: 2023-05-07

## 2023-05-05 MED ORDER — SUMATRIPTAN SUCCINATE 50 MG PO TABS: 50.0000 mg | ORAL_TABLET | ORAL | Status: DC | PRN

## 2023-05-05 MED ORDER — PROPRANOLOL HCL 10 MG PO TABS
10.0000 mg | ORAL_TABLET | Freq: Every day | ORAL | Status: DC | PRN
Start: 2023-05-05 — End: 2023-05-07

## 2023-05-05 MED ORDER — PROPOFOL 10 MG/ML IV BOLUS
INTRAVENOUS | Status: DC | PRN
  Administered 2023-05-05: 25 ug/kg/min via INTRAVENOUS
  Administered 2023-05-05: 170 mg via INTRAVENOUS

## 2023-05-05 MED ORDER — TRAMADOL HCL 50 MG PO TABS
50.0000 mg | ORAL_TABLET | Freq: Four times a day (QID) | ORAL | Status: DC | PRN
  Administered 2023-05-06: 50 mg via ORAL
  Filled 2023-05-05: qty 1

## 2023-05-05 MED ORDER — ALBUMIN HUMAN 5 % IV SOLN: INTRAVENOUS | Status: DC | PRN

## 2023-05-05 MED ORDER — HYDROMORPHONE HCL 2 MG PO TABS
1.0000 mg | ORAL_TABLET | Freq: Four times a day (QID) | ORAL | Status: DC | PRN
  Administered 2023-05-05 – 2023-05-06 (×4): 2 mg via ORAL
  Filled 2023-05-05 (×4): qty 1

## 2023-05-05 MED ORDER — BUPIVACAINE-EPINEPHRINE 0.25% -1:200000 IJ SOLN
INTRAMUSCULAR | Status: DC | PRN
  Administered 2023-05-05: 10 mL
  Administered 2023-05-05: 20 mL

## 2023-05-05 MED ORDER — ADULT MULTIVITAMIN W/MINERALS CH
1.0000 | ORAL_TABLET | Freq: Every day | ORAL | Status: DC
  Administered 2023-05-05 – 2023-05-06 (×2): 1 via ORAL
  Filled 2023-05-05 (×2): qty 1

## 2023-05-05 MED ORDER — SODIUM CHLORIDE 0.9% FLUSH
3.0000 mL | Freq: Two times a day (BID) | INTRAVENOUS | Status: DC
Start: 2023-05-05 — End: 2023-05-07
  Administered 2023-05-05 – 2023-05-06 (×2): 3 mL via INTRAVENOUS

## 2023-05-05 MED ORDER — THROMBIN 20000 UNITS EX SOLR
CUTANEOUS | Status: DC | PRN
  Administered 2023-05-05: 20 mL via TOPICAL

## 2023-05-05 MED ORDER — MIDAZOLAM HCL 2 MG/2ML IJ SOLN
INTRAMUSCULAR | Status: DC | PRN
  Administered 2023-05-05: 2 mg via INTRAVENOUS

## 2023-05-05 MED ORDER — ORAL CARE MOUTH RINSE: 15.0000 mL | Freq: Once | OROMUCOSAL | Status: AC

## 2023-05-05 MED ORDER — METHOCARBAMOL 500 MG PO TABS
500.0000 mg | ORAL_TABLET | Freq: Four times a day (QID) | ORAL | Status: DC | PRN
  Administered 2023-05-05 – 2023-05-06 (×3): 500 mg via ORAL
  Filled 2023-05-05 (×3): qty 1

## 2023-05-05 MED ORDER — ZOLPIDEM TARTRATE 5 MG PO TABS
5.0000 mg | ORAL_TABLET | Freq: Every evening | ORAL | Status: DC | PRN
Start: 2023-05-05 — End: 2023-05-07

## 2023-05-05 MED ORDER — PHENOL 1.4 % MT LIQD: 1.0000 | OROMUCOSAL | Status: DC | PRN

## 2023-05-05 MED ORDER — DIPHENHYDRAMINE HCL 25 MG PO TABS
25.0000 mg | ORAL_TABLET | Freq: Every day | ORAL | Status: DC
Start: 2023-05-05 — End: 2023-05-05
  Filled 2023-05-05: qty 1

## 2023-05-05 MED ORDER — CHLORHEXIDINE GLUCONATE 0.12 % MT SOLN
15.0000 mL | Freq: Once | OROMUCOSAL | Status: AC
  Administered 2023-05-05: 15 mL via OROMUCOSAL
  Filled 2023-05-05: qty 15

## 2023-05-05 MED ORDER — DEXAMETHASONE SODIUM PHOSPHATE 10 MG/ML IJ SOLN
INTRAMUSCULAR | Status: AC
  Filled 2023-05-05: qty 1

## 2023-05-05 MED ORDER — POVIDONE-IODINE 7.5 % EX SOLN
Freq: Once | CUTANEOUS | Status: DC
  Filled 2023-05-05: qty 118

## 2023-05-05 MED ORDER — ACETAMINOPHEN 325 MG PO TABS
650.0000 mg | ORAL_TABLET | ORAL | Status: DC | PRN
Start: 2023-05-05 — End: 2023-05-07
  Administered 2023-05-05 – 2023-05-06 (×2): 650 mg via ORAL
  Filled 2023-05-05 (×2): qty 2

## 2023-05-05 MED ORDER — LIDOCAINE 2% (20 MG/ML) 5 ML SYRINGE
INTRAMUSCULAR | Status: AC
  Filled 2023-05-05: qty 5

## 2023-05-05 SURGICAL SUPPLY — 94 items
AGENT HMST KT MTR STRL THRMB (HEMOSTASIS)
APL SKNCLS STERI-STRIP NONHPOA (GAUZE/BANDAGES/DRESSINGS) ×1
BAG COUNTER SPONGE SURGICOUNT (BAG) ×2 IMPLANT
BAG SPNG CNTER NS LX DISP (BAG) ×1
BENZOIN TINCTURE PRP APPL 2/3 (GAUZE/BANDAGES/DRESSINGS) ×2 IMPLANT
BLADE CLIPPER SURG (BLADE) IMPLANT
BUR PRESCISION 1.7 ELITE (BURR) ×2 IMPLANT
BUR ROUND FLUTED 5 RND (BURR) ×2 IMPLANT
BUR ROUND PRECISION 4.0 (BURR) IMPLANT
BUR SABER RD CUTTING 3.0 (BURR) IMPLANT
CAGE SABLE 10X22 6-12 8D (Cage) IMPLANT
CNTNR URN SCR LID CUP LEK RST (MISCELLANEOUS) ×2 IMPLANT
CONT SPEC 4OZ STRL OR WHT (MISCELLANEOUS) ×1
COVER BACK TABLE 60X90IN (DRAPES) ×2 IMPLANT
COVER MAYO STAND STRL (DRAPES) ×4 IMPLANT
COVER SURGICAL LIGHT HANDLE (MISCELLANEOUS) ×2 IMPLANT
DISPENSER GRAFT BNE VG (MISCELLANEOUS) IMPLANT
DISPENSER VIVIGEN BONE GRAFT (MISCELLANEOUS) ×1 IMPLANT
DRAIN CHANNEL 15F RND FF W/TCR (WOUND CARE) IMPLANT
DRAPE C-ARM 42X72 X-RAY (DRAPES) ×2 IMPLANT
DRAPE C-ARMOR (DRAPES) IMPLANT
DRAPE POUCH INSTRU U-SHP 10X18 (DRAPES) ×2 IMPLANT
DRAPE SURG 17X23 STRL (DRAPES) ×8 IMPLANT
DURAPREP 26ML APPLICATOR (WOUND CARE) ×2 IMPLANT
ELECT BLADE 4.0 EZ CLEAN MEGAD (MISCELLANEOUS) ×1
ELECT CAUTERY BLADE 6.4 (BLADE) ×2 IMPLANT
ELECT REM PT RETURN 9FT ADLT (ELECTROSURGICAL) ×1
ELECTRODE BLDE 4.0 EZ CLN MEGD (MISCELLANEOUS) ×2 IMPLANT
ELECTRODE REM PT RTRN 9FT ADLT (ELECTROSURGICAL) ×2 IMPLANT
EVACUATOR SILICONE 100CC (DRAIN) IMPLANT
FILTER STRAW FLUID ASPIR (MISCELLANEOUS) ×2 IMPLANT
GAUZE 4X4 16PLY ~~LOC~~+RFID DBL (SPONGE) ×2 IMPLANT
GAUZE SPONGE 4X4 12PLY STRL (GAUZE/BANDAGES/DRESSINGS) ×2 IMPLANT
GAUZE SPONGE 4X4 12PLY STRL LF (GAUZE/BANDAGES/DRESSINGS) IMPLANT
GLOVE BIO SURGEON STRL SZ 6.5 (GLOVE) ×2 IMPLANT
GLOVE BIO SURGEON STRL SZ8 (GLOVE) ×2 IMPLANT
GLOVE BIOGEL PI IND STRL 7.0 (GLOVE) ×2 IMPLANT
GLOVE BIOGEL PI IND STRL 8 (GLOVE) ×2 IMPLANT
GLOVE SURG ENC MOIS LTX SZ6.5 (GLOVE) ×2 IMPLANT
GOWN STRL REUS W/ TWL LRG LVL3 (GOWN DISPOSABLE) ×4 IMPLANT
GOWN STRL REUS W/ TWL XL LVL3 (GOWN DISPOSABLE) ×2 IMPLANT
GOWN STRL REUS W/TWL LRG LVL3 (GOWN DISPOSABLE) ×2
GOWN STRL REUS W/TWL XL LVL3 (GOWN DISPOSABLE) ×1
GRAFT TISS 40X70 3 THK DERM (Tissue) IMPLANT
IV CATH 14GX2 1/4 (CATHETERS) ×2 IMPLANT
KIT BASIN OR (CUSTOM PROCEDURE TRAY) ×2 IMPLANT
KIT POSITION SURG JACKSON T1 (MISCELLANEOUS) ×2 IMPLANT
KIT TURNOVER KIT B (KITS) ×2 IMPLANT
MARKER SKIN DUAL TIP RULER LAB (MISCELLANEOUS) ×4 IMPLANT
NDL 18GX1X1/2 (RX/OR ONLY) (NEEDLE) ×2 IMPLANT
NDL 22X1.5 STRL (OR ONLY) (MISCELLANEOUS) ×4 IMPLANT
NDL HYPO 25GX1X1/2 BEV (NEEDLE) ×2 IMPLANT
NDL SPNL 18GX3.5 QUINCKE PK (NEEDLE) ×4 IMPLANT
NEEDLE 18GX1X1/2 (RX/OR ONLY) (NEEDLE) ×1 IMPLANT
NEEDLE 22X1.5 STRL (OR ONLY) (MISCELLANEOUS) ×2 IMPLANT
NEEDLE HYPO 25GX1X1/2 BEV (NEEDLE) ×1 IMPLANT
NEEDLE SPNL 18GX3.5 QUINCKE PK (NEEDLE) ×2 IMPLANT
NS IRRIG 1000ML POUR BTL (IV SOLUTION) ×2 IMPLANT
PACK LAMINECTOMY ORTHO (CUSTOM PROCEDURE TRAY) ×2 IMPLANT
PACK UNIVERSAL I (CUSTOM PROCEDURE TRAY) ×2 IMPLANT
PAD ARMBOARD 7.5X6 YLW CONV (MISCELLANEOUS) ×4 IMPLANT
PATTIES SURGICAL .5 X1 (DISPOSABLE) ×2 IMPLANT
PATTIES SURGICAL .5X1.5 (GAUZE/BANDAGES/DRESSINGS) ×2 IMPLANT
PUTTY DBX 2.5CC (Putty) ×1 IMPLANT
PUTTY DBX 2.5CC DEPUY (Putty) IMPLANT
ROD PRE BENT EXPEDIUM 35MM (Rod) IMPLANT
SCREW CORTICAL VIPER 7X35 (Screw) IMPLANT
SCREW SET SINGLE INNER (Screw) IMPLANT
SCREW VIPER 7MMX30MM CORTICAL (Screw) IMPLANT
SPONGE INTESTINAL PEANUT (DISPOSABLE) ×2 IMPLANT
SPONGE SURGIFOAM ABS GEL 100 (HEMOSTASIS) ×2 IMPLANT
STRIP CLOSURE SKIN 1/2X4 (GAUZE/BANDAGES/DRESSINGS) ×4 IMPLANT
SURGIFLO W/THROMBIN 8M KIT (HEMOSTASIS) IMPLANT
SUT ETHILON 2 0 FS 18 (SUTURE) IMPLANT
SUT MNCRL AB 4-0 PS2 18 (SUTURE) ×2 IMPLANT
SUT VIC AB 0 CT1 18XCR BRD 8 (SUTURE) ×2 IMPLANT
SUT VIC AB 0 CT1 8-18 (SUTURE) ×1
SUT VIC AB 1 CT1 18XCR BRD 8 (SUTURE) ×2 IMPLANT
SUT VIC AB 1 CT1 8-18 (SUTURE) ×1
SUT VIC AB 2-0 CT2 18 VCP726D (SUTURE) ×2 IMPLANT
SYR 20ML LL LF (SYRINGE) ×4 IMPLANT
SYR BULB IRRIG 60ML STRL (SYRINGE) ×2 IMPLANT
SYR CONTROL 10ML LL (SYRINGE) ×4 IMPLANT
SYR TB 1ML LUER SLIP (SYRINGE) ×2 IMPLANT
TAP EXPEDIUM DL 5.0 (INSTRUMENTS) IMPLANT
TAP EXPEDIUM DL 6.0 (INSTRUMENTS) IMPLANT
TAP EXPEDIUM DL 7.0 (INSTRUMENTS) ×1
TAP EXPEDIUM DL 7X2 (INSTRUMENTS) IMPLANT
TAPE PAPER 3X10 WHT MICROPORE (GAUZE/BANDAGES/DRESSINGS) IMPLANT
TISSUE ARTHOFLEX THICK 3MM (Tissue) ×2 IMPLANT
TRAY FOLEY MTR SLVR 16FR STAT (SET/KITS/TRAYS/PACK) ×2 IMPLANT
TUBE FUNNEL GL DISP (ORTHOPEDIC DISPOSABLE SUPPLIES) IMPLANT
WATER STERILE IRR 1000ML POUR (IV SOLUTION) ×2 IMPLANT
YANKAUER SUCT BULB TIP NO VENT (SUCTIONS) ×2 IMPLANT

## 2023-05-05 NOTE — Anesthesia Postprocedure Evaluation (Signed)
Anesthesia Post Note  Patient: KEONA MUHA  Procedure(s) Performed: LEFT-SIDED LUMBAR FIVE - SACRUM ONETRANSFORAMINAL LUMBAR INTERBODY FUSION AND DECOMPRESSION WITH INSTRUMENTATION AND ALLOGRAFT ITH REVISION INSTRUMENTATION (Left)     Patient location during evaluation: PACU Anesthesia Type: General Level of consciousness: awake and alert Pain management: pain level controlled Vital Signs Assessment: post-procedure vital signs reviewed and stable Respiratory status: spontaneous breathing, nonlabored ventilation and respiratory function stable Cardiovascular status: blood pressure returned to baseline Postop Assessment: no apparent nausea or vomiting Anesthetic complications: yes   Encounter Notable Events  Notable Event Outcome Phase Comment  Difficult to intubate - expected  Intraprocedure Filed from anesthesia note documentation.    Last Vitals:  Vitals:   05/05/23 1245 05/05/23 1300  BP: (!) 101/57 (!) 103/55  Pulse: 82 79  Resp: 12 15  Temp:  36.6 C  SpO2: 93% 95%    Last Pain:  Vitals:   05/05/23 1245  TempSrc:   PainSc: Asleep                 Shanda Howells

## 2023-05-05 NOTE — Anesthesia Procedure Notes (Signed)
Procedure Name: Intubation Date/Time: 05/05/2023 7:53 AM  Performed by: Georgianne Fick D, CRNAPre-anesthesia Checklist: Patient identified, Emergency Drugs available, Suction available and Patient being monitored Patient Re-evaluated:Patient Re-evaluated prior to induction Oxygen Delivery Method: Circle System Utilized Preoxygenation: Pre-oxygenation with 100% oxygen Induction Type: IV induction Ventilation: Mask ventilation without difficulty Laryngoscope Size: Glidescope and 3 (Attempt x 1 by CRNA w/ esoph intubation. Attempt x 1 by MDA unsuccessful with Mil 2. GS intubation x 1 attempt without further difficulty) Grade View: Grade I Tube type: Oral Tube size: 7.0 mm Number of attempts: 3 Airway Equipment and Method: Stylet and Oral airway Placement Confirmation: ETT inserted through vocal cords under direct vision, positive ETCO2 and breath sounds checked- equal and bilateral Secured at: 22 cm Tube secured with: Tape Dental Injury: Teeth and Oropharynx as per pre-operative assessment  Difficulty Due To: Difficult Airway- due to anterior larynx, Difficulty was anticipated and Difficult Airway- due to limited oral opening

## 2023-05-05 NOTE — H&P (Signed)
PREOPERATIVE H&P  Chief Complaint: Left leg pain  HPI: [name] is a 72 y.o. female who presents with ongoing pain in the left leg  MRI reveals severe stenosis at L5/S1,the level below the patient's previous fusion  Patient has failed multiple forms of conservative care and continues to have pain (see office notes for additional details regarding the patient's full course of treatment)  Past Medical History:  Diagnosis Date   Anxiety    Arthritis    Depression    DM (diabetes mellitus) (HCC)    Diet controlled   Dysrhythmia    SVT   Goiter    neg biopsy   Hyperlipidemia    Hypertension    Migraine headache    PONV (postoperative nausea and vomiting)    SVT (supraventricular tachycardia) (HCC)    Ulcerative colitis (HCC)    Past Surgical History:  Procedure Laterality Date   ABDOMINAL HYSTERECTOMY     ACHILLES TENDON REPAIR Right    x2   APPENDECTOMY     CARDIOVASCULAR STRESS TEST  11/05/2003   EF 68%   CHOLECYSTECTOMY     DOPPLER ECHOCARDIOGRAPHY  02/11/2000   EF 60-65%   FOOT SURGERY     JOINT REPLACEMENT     NOSE SURGERY     TONSILLECTOMY     TOTAL KNEE ARTHROPLASTY     right knee   TRANSFORAMINAL LUMBAR INTERBODY FUSION (TLIF) WITH PEDICLE SCREW FIXATION 2 LEVEL Right 11/06/2020   Procedure: RIGHT-SIDED LUMBAR 3-4, LUMBAR 4-5 TRANSFORAMINAL LUMBAR INTERBODY FUSION WITH INSTRUMENTATION AND ALLOGRAFT;  Surgeon: Estill Bamberg, MD;  Location: [facility];  Service: Orthopedics;  Laterality: Right;   Social History   Socioeconomic History   Marital status: Married    Spouse name: Not on file   Number of children: 3   Years of education: Not on file   Highest education level: Not on file  Occupational History   Occupation: Accountant    Employer: [employer] [place] [employer]  Tobacco Use   Smoking status: Never   Smokeless tobacco: Never  Vaping Use   Vaping status: Never Used  Substance and Sexual Activity   Alcohol use: Yes    Comment: Rare    Drug use: No   Sexual activity: Not on file  Other Topics Concern   Not on file  Social History Narrative   Not on file   Social Determinants of Health   Financial Resource Strain: Not on file  Food Insecurity: Not on file  Transportation Needs: Not on file  Physical Activity: Not on file  Stress: Not on file  Social Connections: Not on file   Family History  Problem Relation Age of Onset   Myocarditis Mother    Aneurysm Father    Breast cancer Paternal Aunt    Allergies  Allergen Reactions   Topamax [Topiramate] Anxiety   Amitriptyline     Just makes me feel sick    Amoxicillin Hives   Biaxin [Clarithromycin] Hives   Codeine Nausea And Vomiting    Feel sick    Crestor [Rosuvastatin Calcium]     ARM PAIN   Doxycycline Hives   Gabapentin Diarrhea   Lexapro [Escitalopram]     Can not remember reaction    Penicillins Hives   Pravachol     ARM PAIN  *Pravastatin    Protonix [Pantoprazole]     Can not remember reaction    Ranitidine Hcl     Can not remember reaction  No longer on the market   Spironolactone     Can not remember reaction - No longer needed    Prior to Admission medications   Medication Sig Start Date End Date Taking? Authorizing Provider  ALPRAZolam Prudy Feeler) 0.5 MG tablet Take 0.5 mg by mouth at bedtime as needed for anxiety or sleep.   Yes [provider]  aspirin EC 81 MG tablet Take 1 tablet (81 mg total) by mouth daily. Swallow whole. 12/15/21  Yes Azucena Fallen, MD  atorvastatin (LIPITOR) 20 MG tablet Take 20 mg by mouth daily. 08/03/16  Yes [provider]  colestipol (COLESTID) 1 g tablet Take 2 g by mouth 2 (two) times daily.   Yes [provider]  diphenhydrAMINE (BENADRYL) 25 MG tablet Take 25 mg by mouth at bedtime.   Yes [provider]  esomeprazole (NEXIUM) 20 MG capsule Take 20 mg by mouth daily as needed (heartburn).   Yes [provider]  metoprolol (TOPROL-XL) 100 MG 24 hr tablet  Take 50 mg by mouth at bedtime.   Yes [provider]  mirabegron ER (MYRBETRIQ) 50 MG TB24 tablet Take 50 mg by mouth daily.   Yes [provider]  Multiple Vitamins-Minerals (MULTIVITAMIN WITH MINERALS) tablet Take 1 tablet by mouth daily.   Yes [provider]  Probiotic Product (PRO-BIOTIC BLEND PO) Take 1 capsule by mouth at bedtime.   Yes [provider]  rizatriptan (MAXALT) 10 MG tablet Take 10 mg by mouth daily as needed for migraine. May repeat in 2 hours if needed. For migraines   Yes [provider]  sertraline (ZOLOFT) 100 MG tablet Take 200 mg by mouth daily.   Yes [provider]  traMADol (ULTRAM) 50 MG tablet Take 50 mg by mouth at bedtime. May take an additional dose if needed throughout the day 01/25/18  Yes [provider]  zolpidem (AMBIEN) 10 MG tablet Take 10 mg by mouth at bedtime.   Yes [provider]  Probiotic, Lactobacillus, CAPS Take 1 capsule by mouth daily at 6 (six) AM. 01/26/17   [provider]  propranolol (INDERAL) 10 MG tablet Take 10 mg by mouth daily as needed (For SVT episodes). 03/27/11   Nahser, Deloris Ping, MD     All other systems have been reviewed and were otherwise negative with the exception of those mentioned in the HPI and as above.  Physical Exam: Vitals:   05/05/23 0552  BP: (!) 143/82  Pulse: 72  Resp: 17  Temp: 98.5 F (36.9 C)  SpO2: 94%    Body mass index is 36.8 kg/m.  General: Alert, no acute distress Cardiovascular: No pedal edema Respiratory: No cyanosis, no use of accessory musculature Skin: No lesions in the area of chief complaint Neurologic: Sensation intact distally Psychiatric: Patient is competent for consent with normal mood and affect Lymphatic: No axillary or cervical lymphadenopathy   Assessment/Plan: SEVERE L5/S1 LUMBAR STENOSIS BELOW A PREVIOUS FUSION WITH INSTRUMENTATION Plan for Procedure(s): LEFT-SIDED LUMBAR 5 - SACRUM 1  TRANSFORAMINAL LUMBAR INTERBODY FUSION AND DECOMPRESSION WITH INSTRUMENTATION AND ALLOGRAFT   Jackelyn Hoehn, MD 05/05/2023 6:50 AM

## 2023-05-05 NOTE — Transfer of Care (Signed)
Immediate Anesthesia Transfer of Care Note  Patient: [name]  Procedure(s) Performed: LEFT-SIDED LUMBAR FIVE - SACRUM ONETRANSFORAMINAL LUMBAR INTERBODY FUSION AND DECOMPRESSION WITH INSTRUMENTATION AND ALLOGRAFT ITH REVISION INSTRUMENTATION (Left)  Patient Location: PACU  Anesthesia Type:General  Level of Consciousness: awake, alert , and oriented  Airway & Oxygen Therapy: Patient Spontanous Breathing and Patient connected to face mask oxygen  Post-op Assessment: Report given to RN and Post -op Vital signs reviewed and stable  Post vital signs: Reviewed and stable  Last Vitals:  Vitals Value Taken Time  BP 120/46 05/05/23 1145  Temp    Pulse 80 05/05/23 1146  Resp 10 05/05/23 1146  SpO2 97 % 05/05/23 1146  Vitals shown include unfiled device data.  Last Pain:  Vitals:   05/05/23 0621  TempSrc:   PainSc: 4          Complications:  Encounter Notable Events  Notable Event Outcome Phase Comment  Difficult to intubate - expected  Intraprocedure Filed from anesthesia note documentation.

## 2023-05-05 NOTE — Op Note (Signed)
PATIENT NAME: [name]   MEDICAL RECORD NO.:   [id number]   DATE OF BIRTH: Sep 24, 1950   DATE OF PROCEDURE: 05/05/2023                               OPERATIVE REPORT   PREOPERATIVE DIAGNOSES: 1. Left greater than right lumbar radiculopathy 2. Status post previous L3-4 and L4-5 fusion with instrumentation 3. Severe spinal stenosis, L5-S1   POSTOPERATIVE DIAGNOSES: 1. Left greater than right lumbar radiculopathy 2. Status post previous L3-4 and L4-5 fusion with instrumentation 3. Severe spinal stenosis, L5-S1   PROCEDURES: 1. Left-sided L5-S1 transforaminal lumbar interbody fusion. 2. Right-sided L5-S1 posterolateral fusion. 3. Insertion of interbody device x1 (Globus expandable intervertebral spacer). 4. Placement of posterior instrumentation at S1 bilaterally 5.  Removal and reinsertion of bilateral L5 instrumentation 6.  Removal of bilateral L3 and L4 instrumentation 7.  Exploration of spinal fusion L3-4, L4-5 8.  Use of local autograft. 9.  Use of morselized allograft - ViviGen. 10. Intraoperative use of fluoroscopy.   SURGEON:  Estill Bamberg, MD.   ASSISTANTJason Coop, PA-C.   ANESTHESIA:  General endotracheal anesthesia.   COMPLICATIONS:  None.   DISPOSITION:  Stable.   ESTIMATED BLOOD LOSS:   250cc   INDICATIONS FOR SURGERY:  Briefly, Ms. Huss is a pleasant 72 year old female who did present to me with severe and ongoing pain in the left and right legs.  She also had ongoing pain in her back.  Her MRI did reveal the findings outlined above.  We did have an extensive discussion about surgery, and she did elect to proceed with the procedure noted above.      OPERATIVE DETAILS:  On 05/05/2023, the patient was brought to surgery and general endotracheal anesthesia was administered.  The patient was placed prone on a well-padded flat Jackson bed with a spinal frame.  Antibiotics were given and a time-out procedure was performed. The back was  prepped and draped in the usual fashion.  A midline incision was made, spanning L3-S1.  The fascia was incised at the midline.  The paraspinal musculature was bluntly swept laterally.  The previously placed instrumentation spanning L3-L5 was identified.  The caps overlying the rods were removed, as were the interconnecting rods.  At this point, I did turned my attention towards exploring the fusion on the left side.  The facet joints at L3-4 and L4-5 are subperiosteally exposed.  I did firmly grasp the L3, L4, and L5 pedicles on the left.  A pushing and pulling maneuver was performed across each of the pedicles, while visualizing the facet joints.  There is no motion noted across the L4-5 or L3-4 intervertebral disc spaces.  In this way, I was able to explore the fusion, and confirm a successful fusion across L4-5 and L3-4.  The L3, L4, and L5 pedicle screws were then removed bilaterally.  Bone wax was placed in the pedicle holes at L3 and L4.  The L5 pedicles were tapped up to a 7 mm tap.  Of note, 6 mm screws were removed.  On the right, a 7 x 30 mm screw was placed into the L5 pedicle hole.  At this point, the right L5-S1 facet joint was subperiosteally exposed and decorticated.  The right S1 pedicle was cannulated using a cortical medial to lateral trajectory technique.  A 6 mm tap was used, and a 7 x 35 mm screw was advanced  into the right S1 pedicle.  A 35 mm rod was placed and gentle distraction was applied across the rod.  At this point, caps were placed on the right at L5 and S1.  On the left side, the L5 pedicle was tapped up to a 7 mm tap and bone wax was placed.  The left S1 pedicle was cannulated in the manner previously described on the right.  Bone wax was placed in the cannulated pedicle hole.  At this point, I did turn my attention to the decompression.  A partial facetectomy was performed on the right, and on the left, a full facetectomy was performed.  I was able to remove abundant facet  hypertrophy, as well as a small facet cyst, which was compressing the left lateral recess.  At this point, the traversing left S1 nerve was gently medialized, and with an assistant holding gentle medial retraction, an annulotomy was performed at the posterior lateral aspect of the L5-S1 intervertebral disc.  I then used a series of curettes and pituitary rongeurs to perform a thorough and complete intervertebral diskectomy.  The distraction on the contralateral right side was released.  The intervertebral space was then liberally packed with autograft as well as allograft in the form of ViviGen, as was the appropriate-sized intervertebral spacer.  The spacer was then tamped into position in the usual fashion, and expanded to 11.25 mm in height.  I was very pleased with the press-fit of the spacer.  I then placed 7 mm screws on the left at L5 and S1.  A 40-mm rod was then placed and caps were placed.  All 4 caps were then locked.  The wound was copiously irrigated with a total of approximately 3 L prior to placing the bone graft.  Additional autograft and allograft were then packed into the posterolateral gutter on the right side to help aid in the L5-S1 fusion.  The wound was explored for any undue bleeding and there was no substantial bleeding encountered.  Gel-Foam was placed over the laminectomy site.  The wound was then closed in layers using #1 Vicryl followed by 2-0 Vicryl, followed by 4-0 Monocryl.  Benzoin and Steri-Strips were applied followed by sterile dressing.  There was very minor oozing in the lateral recess on the left side, and therefore, a #15 deep Blake drain was placed deep to the fascia.    Of note, Jason Coop was my assistant throughout surgery, and did aid in retraction, suctioning, and closure.     Estill Bamberg, MD

## 2023-05-06 ENCOUNTER — Encounter (HOSPITAL_COMMUNITY): Payer: Self-pay | Admitting: Orthopedic Surgery

## 2023-05-06 ENCOUNTER — Other Ambulatory Visit (HOSPITAL_COMMUNITY): Payer: Self-pay

## 2023-05-06 DIAGNOSIS — M5416 Radiculopathy, lumbar region: Secondary | ICD-10-CM | POA: Diagnosis not present

## 2023-05-06 DIAGNOSIS — I10 Essential (primary) hypertension: Secondary | ICD-10-CM | POA: Diagnosis not present

## 2023-05-06 DIAGNOSIS — E119 Type 2 diabetes mellitus without complications: Secondary | ICD-10-CM | POA: Diagnosis not present

## 2023-05-06 DIAGNOSIS — Z96651 Presence of right artificial knee joint: Secondary | ICD-10-CM | POA: Diagnosis not present

## 2023-05-06 DIAGNOSIS — Z7982 Long term (current) use of aspirin: Secondary | ICD-10-CM | POA: Diagnosis not present

## 2023-05-06 DIAGNOSIS — M4807 Spinal stenosis, lumbosacral region: Secondary | ICD-10-CM | POA: Diagnosis not present

## 2023-05-06 LAB — GLUCOSE, CAPILLARY
Glucose-Capillary: 111 mg/dL — ABNORMAL HIGH (ref 70–99)
Glucose-Capillary: 118 mg/dL — ABNORMAL HIGH (ref 70–99)
Glucose-Capillary: 123 mg/dL — ABNORMAL HIGH (ref 70–99)

## 2023-05-06 MED ORDER — DIAZEPAM 5 MG PO TABS
5.0000 mg | ORAL_TABLET | Freq: Four times a day (QID) | ORAL | Status: DC | PRN
  Administered 2023-05-06: 5 mg via ORAL
  Filled 2023-05-06: qty 1

## 2023-05-06 MED ORDER — OXYCODONE HCL ER 10 MG PO T12A
10.0000 mg | EXTENDED_RELEASE_TABLET | Freq: Two times a day (BID) | ORAL | Status: DC
  Administered 2023-05-06 – 2023-05-07 (×2): 10 mg via ORAL
  Filled 2023-05-06 (×2): qty 1

## 2023-05-06 MED ORDER — HYDROMORPHONE HCL 2 MG PO TABS
2.0000 mg | ORAL_TABLET | ORAL | Status: DC | PRN
  Administered 2023-05-07: 2 mg via ORAL
  Filled 2023-05-06: qty 1

## 2023-05-06 MED ORDER — METHOCARBAMOL 500 MG PO TABS
500.0000 mg | ORAL_TABLET | Freq: Four times a day (QID) | ORAL | 2 refills | Status: AC | PRN
  Filled 2023-05-06: qty 30, 4d supply, fill #0

## 2023-05-06 MED ORDER — METHOCARBAMOL 1000 MG/10ML IJ SOLN: 500.0000 mg | Freq: Four times a day (QID) | INTRAMUSCULAR | Status: DC

## 2023-05-06 MED ORDER — MUPIROCIN 2 % EX OINT
1.0000 | TOPICAL_OINTMENT | Freq: Two times a day (BID) | CUTANEOUS | Status: DC
  Administered 2023-05-06 – 2023-05-07 (×2): 1 via NASAL
  Filled 2023-05-06: qty 22

## 2023-05-06 MED ORDER — HYDROMORPHONE HCL 2 MG PO TABS: 2.0000 mg | ORAL_TABLET | ORAL | Status: DC | PRN

## 2023-05-06 MED ORDER — METHOCARBAMOL 500 MG PO TABS
1000.0000 mg | ORAL_TABLET | Freq: Four times a day (QID) | ORAL | Status: DC
  Administered 2023-05-06: 500 mg via ORAL
  Administered 2023-05-06: 1000 mg via ORAL
  Filled 2023-05-06 (×2): qty 2

## 2023-05-06 MED ORDER — HYDROMORPHONE HCL 2 MG PO TABS
2.0000 mg | ORAL_TABLET | ORAL | Status: DC | PRN
  Administered 2023-05-06: 4 mg via ORAL
  Filled 2023-05-06: qty 2

## 2023-05-06 MED ORDER — HYDROMORPHONE HCL 2 MG PO TABS
1.0000 mg | ORAL_TABLET | ORAL | 0 refills | Status: DC | PRN
  Filled 2023-05-06: qty 30, 5d supply, fill #0

## 2023-05-06 NOTE — Evaluation (Signed)
Occupational Therapy Evaluation Patient Details Name: [name] MRN: [id number] DOB: 25-Nov-1950 Today's Date: 05/06/2023   History of Present Illness [name] is a 72 yo female who underwent L5-S1 TLIF 10/23. PMHx: anxiety, arthritis, depression, DM, HLD< HTN, PONV, SVT, ulcerative colitis   Clinical Impression   [name] was evaluated s/p the above admission list. She needs minimal assist for LB ADLs from her husband at baseline, pt also reports several falls. Upon evaluation the pt was limited by spinal precautions, back brace, pain, bilateral hip weakness and buckling and poor activity tolerance. Overall she needed mod-max A +2 for bed mobility, mod A +2 to stand from EOB and up to mod A +2 for hallway mobility with RW. Pt with bilateral LE buckling and is an extremely high fall risk. Due to the deficits listed below the pt also needs max A +2 for LB ADLs and up to min A for UB ADLs in sitting due to pt's need to have BUEs supported externally for pain management. Pt will benefit from continued acute OT services and skilled inpatient follow up therapy, <3 hours/day due to significant fall risk and current assist level. Anticipate good progress acutely once pain improves with potential to discharge home after more acute therapy.        If plan is discharge home, recommend the following: A lot of help with walking and/or transfers;A lot of help with bathing/dressing/bathroom;Assistance with cooking/housework;Direct supervision/assist for medications management;Direct supervision/assist for financial management;Assist for transportation;Help with stairs or ramp for entrance    Functional Status Assessment  Patient has had a recent decline in their functional status and demonstrates the ability to make significant improvements in function in a reasonable and predictable amount of time.  Equipment Recommendations  None recommended by OT       Precautions / Restrictions  Precautions Precautions: Fall;Back Precaution Booklet Issued: Yes (comment) Required Braces or Orthoses: Spinal Brace Spinal Brace: Thoracolumbosacral orthotic;Applied in sitting position Restrictions Weight Bearing Restrictions: No      Mobility Bed Mobility Overal bed mobility: Needs Assistance Bed Mobility: Rolling, Sidelying to Sit, Sit to Sidelying Rolling: Mod assist, +2 for physical assistance, +2 for safety/equipment Sidelying to sit: Max assist, +2 for physical assistance, +2 for safety/equipment     Sit to sidelying: Mod assist, +2 for physical assistance, +2 for safety/equipment General bed mobility comments: significantly increased time and cues needed    Transfers Overall transfer level: Needs assistance Equipment used: Rolling walker (2 wheels) Transfers: Sit to/from Stand Sit to Stand: Mod assist, +2 physical assistance, +2 safety/equipment           General transfer comment: limited by bilateral hip pain and buckling with ambulation      Balance Overall balance assessment: Needs assistance Sitting-balance support: Feet supported, Bilateral upper extremity supported Sitting balance-Leahy Scale: Poor Sitting balance - Comments: requires BUE support in sitting for pain management   Standing balance support: Bilateral upper extremity supported, During functional activity Standing balance-Leahy Scale: Poor Standing balance comment: up to mod A +2 needed for standing                           ADL either performed or assessed with clinical judgement   ADL Overall ADL's : Needs assistance/impaired Eating/Feeding: Independent   Grooming: Set up;Bed level Grooming Details (indicate cue type and reason): requires BUE in sitting for pain relief Upper Body Bathing: Minimal assistance   Lower Body Bathing: Maximal assistance;+2  for physical assistance;+2 for safety/equipment   Upper Body Dressing : Minimal assistance   Lower Body Dressing:  Maximal assistance   Toilet Transfer: Moderate assistance;+2 for physical assistance;+2 for safety/equipment;Ambulation;Rolling walker (2 wheels);Regular Toilet   Toileting- Clothing Manipulation and Hygiene: Maximal assistance;+2 for physical assistance;+2 for safety/equipment;Sit to/from stand       Functional mobility during ADLs: Moderate assistance;+2 for safety/equipment;+2 for physical assistance;Rolling walker (2 wheels) General ADL Comments: significantly limited by pain and BLE buckling with mobility     Vision Baseline Vision/History: 0 No visual deficits Vision Assessment?: No apparent visual deficits     Perception Perception: Within Functional Limits       Praxis Praxis: WFL       Pertinent Vitals/Pain Pain Assessment Pain Assessment: Faces Faces Pain Scale: Hurts whole lot Pain Location: bilateral hips Pain Descriptors / Indicators: Discomfort Pain Intervention(s): Limited activity within patient's tolerance, Monitored during session     Extremity/Trunk Assessment Upper Extremity Assessment Upper Extremity Assessment: Overall WFL for tasks assessed   Lower Extremity Assessment Lower Extremity Assessment: Defer to PT evaluation   Cervical / Trunk Assessment Cervical / Trunk Assessment: Back Surgery   Communication Communication Communication: No apparent difficulties   Cognition Arousal: Alert Behavior During Therapy: Anxious Overall Cognitive Status: Within Functional Limits for tasks assessed       General Comments: good recall of precautions, limited insight to safety and deficits.     General Comments  VSS, drain in place     Home Living Family/patient expects to be discharged to:: Private residence Living Arrangements: Spouse/significant other Available Help at Discharge: Family;Available 24 hours/day Type of Home: House Home Access: Stairs to enter;Ramped entrance Entrance Stairs-Number of Steps: 2   Home Layout: One level      Bathroom Shower/Tub: Producer, television/film/video: Standard (with toilet riser)     Home Equipment: Agricultural consultant (2 wheels);Cane - single point;BSC/3in1;Shower seat;Grab bars - tub/shower;Wheelchair - manual;Hospital bed;Adaptive equipment Adaptive Equipment: Reacher        Prior Functioning/Environment Prior Level of Function : Independent/Modified Independent;Driving             Mobility Comments: AD intermittently, reports several falls ADLs Comments: husband assists with LB ADLs        OT Problem List: Decreased strength;Decreased range of motion;Decreased activity tolerance;Impaired balance (sitting and/or standing);Decreased cognition;Decreased safety awareness;Decreased knowledge of use of DME or AE;Decreased knowledge of precautions;Pain      OT Treatment/Interventions: Self-care/ADL training;Therapeutic exercise;DME and/or AE instruction;Therapeutic activities;Patient/family education    OT Goals(Current goals can be found in the care plan section) Acute Rehab OT Goals Patient Stated Goal: less pain OT Goal Formulation: With patient/family Time For Goal Achievement: 05/20/23 Potential to Achieve Goals: Good ADL Goals Pt Will Perform Grooming: with contact guard assist;standing Pt Will Perform Upper Body Dressing: with set-up;sitting Pt Will Perform Lower Body Dressing: with min assist;sit to/from stand Pt Will Transfer to Toilet: with contact guard assist;ambulating Additional ADL Goal #1: Pt will complete bed mobility with supervision A as a precursor to ADLs  OT Frequency: Min 1X/week    Co-evaluation PT/OT/SLP Co-Evaluation/Treatment: Yes Reason for Co-Treatment: Complexity of the patient's impairments (multi-system involvement);For patient/therapist safety;To address functional/ADL transfers   OT goals addressed during session: ADL's and self-care      AM-PAC OT "6 Clicks" Daily Activity     Outcome Measure Help from another person eating  meals?: None Help from another person taking care of personal grooming?: A Little Help from another  person toileting, which includes using toliet, bedpan, or urinal?: A Lot Help from another person bathing (including washing, rinsing, drying)?: A Lot Help from another person to put on and taking off regular upper body clothing?: A Little Help from another person to put on and taking off regular lower body clothing?: A Lot 6 Click Score: 16   End of Session Equipment Utilized During Treatment: Gait belt;Rolling walker (2 wheels);Back brace Nurse Communication: Mobility status (Pt would benefit from continued stay in acute care.)  Activity Tolerance: Patient limited by pain Patient left: in bed;with call bell/phone within reach;with bed alarm set;with family/visitor present  OT Visit Diagnosis: Unsteadiness on feet (R26.81);Other abnormalities of gait and mobility (R26.89);Muscle weakness (generalized) (M62.81);Pain                Time: 4098-1191 OT Time Calculation (min): 30 min Charges:  OT General Charges $OT Visit: 1 Visit OT Evaluation $OT Eval Moderate Complexity: 1 Mod  Derenda Mis, OTR/L Acute Rehabilitation Services Office [phone] Secure Chat Communication Preferred   Donia Pounds 05/06/2023, 10:13 AM

## 2023-05-06 NOTE — Evaluation (Addendum)
Physical Therapy Evaluation  Patient Details Name: [name] MRN: [id number] DOB: 11/06/1950 Today's Date: 05/06/2023  History of Present Illness  [name] is a 72 yo female who underwent L5-S1 TLIF 10/23. PMHx: anxiety, arthritis, depression, DM, HLD< HTN, PONV, SVT, ulcerative colitis   Clinical Impression  Pt admitted with above diagnosis. At the time of PT eval, pt was able to demonstrate transfers and ambulation with up to +2 mod assist and RW for support. Pt was significantly limited by B hip pain throughout session and B knee buckling during OOB mobility. Based on performance today, pt is at a high risk for falls and recommend another day in the hospital to work with therapies and improve with safety and mobility. If pt is not able to stay, recommend post-acute therapy <3 hours/day. Pt was educated on precautions, brace application/wearing schedule, positioning recommendations, and use of ice for pain control. Pt currently with functional limitations due to the deficits listed below (see PT Problem List). Pt will benefit from skilled PT to increase their independence and safety with mobility to allow discharge to the venue listed below.          If plan is discharge home, recommend the following: A lot of help with walking and/or transfers;A lot of help with bathing/dressing/bathroom;Assistance with cooking/housework;Assist for transportation;Help with stairs or ramp for entrance   Can travel by private vehicle   Yes    Equipment Recommendations Rolling walker (2 wheels)  Recommendations for Other Services       Functional Status Assessment Patient has had a recent decline in their functional status and demonstrates the ability to make significant improvements in function in a reasonable and predictable amount of time.     Precautions / Restrictions Precautions Precautions: Fall;Back Precaution Booklet Issued: Yes (comment) Required Braces or Orthoses: Spinal  Brace Spinal Brace: Thoracolumbosacral orthotic;Applied in sitting position Restrictions Weight Bearing Restrictions: No      Mobility  Bed Mobility Overal bed mobility: Needs Assistance Bed Mobility: Rolling, Sidelying to Sit, Sit to Sidelying Rolling: Mod assist, +2 for physical assistance, +2 for safety/equipment Sidelying to sit: Max assist, +2 for physical assistance, +2 for safety/equipment     Sit to sidelying: Mod assist, +2 for physical assistance, +2 for safety/equipment General bed mobility comments: +2 assist for log roll technique and bed pad utilized for full roll. Significantly increased time and cues needed.    Transfers Overall transfer level: Needs assistance Equipment used: Rolling walker (2 wheels) Transfers: Sit to/from Stand Sit to Stand: Mod assist, +2 physical assistance, +2 safety/equipment           General transfer comment: VC's for hand placement on seated surface for safety. +2 assist for power up to full stand and to gain/maintain standing balance.    Ambulation/Gait Ambulation/Gait assistance: +2 physical assistance, Min assist, +2 safety/equipment Gait Distance (Feet): 100 Feet Assistive device: Rolling walker (2 wheels) Gait Pattern/deviations: Step-through pattern, Decreased stride length, Trunk flexed, Drifts right/left, Knees buckling Gait velocity: Decreased Gait velocity interpretation: 1.31 - 2.62 ft/sec, indicative of limited community ambulator   General Gait Details: B knees buckling and pt requiring standing rest breaks. Pt able to advance LE's but with increased spasming and pain. +2 assist required for safety due to knee buckling and high risk for falls.  Stairs            Wheelchair Mobility     Tilt Bed    Modified Rankin (Stroke Patients Only)  Balance Overall balance assessment: Needs assistance Sitting-balance support: Feet supported, Bilateral upper extremity supported Sitting balance-Leahy Scale:  Poor Sitting balance - Comments: requires BUE support in sitting for pain management   Standing balance support: Bilateral upper extremity supported, During functional activity Standing balance-Leahy Scale: Poor Standing balance comment: up to mod A +2 needed for standing                             Pertinent Vitals/Pain Pain Assessment Pain Assessment: Faces Faces Pain Scale: Hurts whole lot Pain Location: bilateral hips Pain Descriptors / Indicators: Discomfort Pain Intervention(s): Limited activity within patient's tolerance, Monitored during session, Repositioned    Home Living Family/patient expects to be discharged to:: Private residence Living Arrangements: Spouse/significant other Available Help at Discharge: Family;Available 24 hours/day Type of Home: House Home Access: Stairs to enter;Ramped entrance   Entrance Stairs-Number of Steps: 2   Home Layout: One level Home Equipment: Agricultural consultant (2 wheels);Cane - single point;BSC/3in1;Shower seat;Grab bars - tub/shower;Wheelchair - manual;Hospital bed;Adaptive equipment      Prior Function Prior Level of Function : Independent/Modified Independent;Driving             Mobility Comments: AD intermittently, reports several falls ADLs Comments: husband assists with LB ADLs     Extremity/Trunk Assessment   Upper Extremity Assessment Upper Extremity Assessment: Defer to OT evaluation    Lower Extremity Assessment Lower Extremity Assessment: Generalized weakness (Bilateral pain in hips, cramping/spasming, and knee buckling)    Cervical / Trunk Assessment Cervical / Trunk Assessment: Back Surgery  Communication   Communication Communication: No apparent difficulties  Cognition Arousal: Alert Behavior During Therapy: Anxious Overall Cognitive Status: Within Functional Limits for tasks assessed                                 General Comments: good recall of precautions, limited  insight to safety and deficits.        General Comments General comments (skin integrity, edema, etc.): VSS, drain in place    Exercises     Assessment/Plan    PT Assessment Patient needs continued PT services  PT Problem List Decreased strength;Decreased range of motion;Decreased activity tolerance;Decreased balance;Decreased mobility;Decreased knowledge of use of DME;Decreased safety awareness;Decreased knowledge of precautions;Pain       PT Treatment Interventions DME instruction;Gait training;Functional mobility training;Therapeutic activities;Therapeutic exercise;Balance training;Patient/family education    PT Goals (Current goals can be found in the Care Plan section)  Acute Rehab PT Goals Patient Stated Goal: Decrease pain PT Goal Formulation: With patient Time For Goal Achievement: 05/13/23 Potential to Achieve Goals: Good    Frequency Min 5X/week     Co-evaluation PT/OT/SLP Co-Evaluation/Treatment: Yes Reason for Co-Treatment: Complexity of the patient's impairments (multi-system involvement);For patient/therapist safety;To address functional/ADL transfers PT goals addressed during session: Mobility/safety with mobility;Balance;Proper use of DME OT goals addressed during session: ADL's and self-care       AM-PAC PT "6 Clicks" Mobility  Outcome Measure Help needed turning from your back to your side while in a flat bed without using bedrails?: Total Help needed moving from lying on your back to sitting on the side of a flat bed without using bedrails?: Total Help needed moving to and from a bed to a chair (including a wheelchair)?: Total Help needed standing up from a chair using your arms (e.g., wheelchair or bedside chair)?: Total Help needed to walk in  hospital room?: A Lot Help needed climbing 3-5 steps with a railing? : Total 6 Click Score: 7    End of Session Equipment Utilized During Treatment: Gait belt Activity Tolerance: Patient tolerated treatment  well Patient left: in bed;with call bell/phone within reach Nurse Communication: Mobility status (Safety concerns with d/c) PT Visit Diagnosis: Unsteadiness on feet (R26.81);Difficulty in walking, not elsewhere classified (R26.2);Pain Pain - Right/Left:  (Bilateral) Pain - part of body: Hip    Time: 2951-8841 PT Time Calculation (min) (ACUTE ONLY): 34 min   Charges:   PT Evaluation $PT Eval Moderate Complexity: 1 Mod   PT General Charges $$ ACUTE PT VISIT: 1 Visit         Conni Slipper, PT, DPT Acute Rehabilitation Services Secure Chat Preferred Office: [phone]   Marylynn Pearson 05/06/2023, 11:39 AM

## 2023-05-06 NOTE — Progress Notes (Signed)
    Patient doing well  Patient denies leg pain   Physical Exam: Vitals:   05/05/23 2239 05/06/23 0317  BP: 101/88 133/83  Pulse: 83 79  Resp: 18 18  Temp: 98.8 F (37.1 C) 99 F (37.2 C)  SpO2: 94% 95%    Dressing in place NVI  Drain output: 80cc since 3pm (20cc/4 hours)  POD #1 s/p L5/S1 decompression and fusion, doing well  - up with PT/OT, encourage ambulation - Percocet for pain, Robaxin for muscle spasms - likely d/c home today with f/u in 2 weeks

## 2023-05-07 ENCOUNTER — Other Ambulatory Visit (HOSPITAL_COMMUNITY): Payer: Self-pay

## 2023-05-07 DIAGNOSIS — E119 Type 2 diabetes mellitus without complications: Secondary | ICD-10-CM | POA: Diagnosis not present

## 2023-05-07 DIAGNOSIS — Z7982 Long term (current) use of aspirin: Secondary | ICD-10-CM | POA: Diagnosis not present

## 2023-05-07 DIAGNOSIS — M4807 Spinal stenosis, lumbosacral region: Secondary | ICD-10-CM | POA: Diagnosis not present

## 2023-05-07 DIAGNOSIS — Z96651 Presence of right artificial knee joint: Secondary | ICD-10-CM | POA: Diagnosis not present

## 2023-05-07 DIAGNOSIS — I10 Essential (primary) hypertension: Secondary | ICD-10-CM | POA: Diagnosis not present

## 2023-05-07 DIAGNOSIS — M5416 Radiculopathy, lumbar region: Secondary | ICD-10-CM | POA: Diagnosis not present

## 2023-05-07 LAB — GLUCOSE, CAPILLARY: Glucose-Capillary: 130 mg/dL — ABNORMAL HIGH (ref 70–99)

## 2023-05-07 MED ORDER — OXYCODONE HCL ER 10 MG PO T12A
10.0000 mg | EXTENDED_RELEASE_TABLET | Freq: Two times a day (BID) | ORAL | 0 refills | Status: DC
  Filled 2023-05-07: qty 14, 7d supply, fill #0

## 2023-05-07 MED ORDER — MORPHINE SULFATE ER 15 MG PO TBCR: 15.0000 mg | EXTENDED_RELEASE_TABLET | Freq: Two times a day (BID) | ORAL | 0 refills | Status: AC

## 2023-05-07 MED ORDER — METHOCARBAMOL 1000 MG/10ML IJ SOLN: 500.0000 mg | Freq: Four times a day (QID) | INTRAMUSCULAR | Status: DC | PRN

## 2023-05-07 MED ORDER — METHOCARBAMOL 500 MG PO TABS: 1000.0000 mg | ORAL_TABLET | Freq: Four times a day (QID) | ORAL | Status: DC | PRN

## 2023-05-07 NOTE — Progress Notes (Signed)
Patient alert and oriented, voiding adequately, skin clean, dry and intact without evidence of skin break down, or symptoms of complications - no redness or edema noted, only slight tenderness at site.  Patient states pain is manageable at time of discharge. Patient has an appointment with MD in 2 weeks 

## 2023-05-07 NOTE — Progress Notes (Signed)
    Patient doing well PO day 2, pain much better controlled today with addition of oxycontin. She has been up and walking and feels that she should be able to progress home today. Sedation much improved   Physical Exam: Vitals:   05/06/23 2257 05/07/23 0341  BP: 117/60 (!) 116/51  Pulse: 89 91  Resp: 18 18  Temp: 98.6 F (37 C) 98.4 F (36.9 C)  SpO2: 95% 92%    Dressing in place, CDI, pt sitting up comfortably  NVI  POD #2 s/p Lumbar fusion with Improved pain  - up with PT/OT, encourage ambulation - Dilaudid and Oxycontin for pain, Robaxin for muscle spasms - likely d/c home today with f/u in 2 weeks

## 2023-05-07 NOTE — Plan of Care (Signed)
  Problem: Education: Goal: Knowledge of General Education information will improve Description: Including pain rating scale, medication(s)/side effects and non-pharmacologic comfort measures Outcome: Completed/Met   Problem: Health Behavior/Discharge Planning: Goal: Ability to manage health-related needs will improve Outcome: Completed/Met   Problem: Clinical Measurements: Goal: Ability to maintain clinical measurements within normal limits will improve Outcome: Completed/Met Goal: Will remain free from infection Outcome: Completed/Met Goal: Diagnostic test results will improve Outcome: Completed/Met   Problem: Activity: Goal: Risk for activity intolerance will decrease Outcome: Completed/Met   Problem: Nutrition: Goal: Adequate nutrition will be maintained Outcome: Completed/Met   Problem: Coping: Goal: Level of anxiety will decrease Outcome: Completed/Met   Problem: Elimination: Goal: Will not experience complications related to bowel motility Outcome: Completed/Met Goal: Will not experience complications related to urinary retention Outcome: Completed/Met   Problem: Pain Management: Goal: General experience of comfort will improve Outcome: Completed/Met   Problem: Safety: Goal: Ability to remain free from injury will improve Outcome: Completed/Met   Problem: Skin Integrity: Goal: Risk for impaired skin integrity will decrease Outcome: Completed/Met   Problem: Acute Rehab OT Goals (only OT should resolve) Goal: Pt. Will Perform Grooming Outcome: Completed/Met Goal: Pt. Will Perform Upper Body Dressing Outcome: Completed/Met Goal: Pt. Will Perform Lower Body Dressing Outcome: Completed/Met Goal: Pt. Will Transfer To Toilet Outcome: Completed/Met Goal: OT Additional ADL Goal #1 Outcome: Completed/Met   Problem: Acute Rehab PT Goals(only PT should resolve) Goal: Pt Will Go Supine/Side To Sit Outcome: Completed/Met Goal: Patient Will Transfer Sit To/From  Stand Outcome: Completed/Met Goal: Pt Will Ambulate Outcome: Completed/Met

## 2023-05-07 NOTE — Progress Notes (Signed)
Physical Therapy Treatment  Patient Details Name: [name] MRN: [id number] DOB: [birth year] Today's Date: 05/07/2023   History of Present Illness [name] is a 72 yo female who underwent L5-S1 TLIF 10/23. PMHx: anxiety, arthritis, depression, DM, HLD< HTN, PONV, SVT, ulcerative colitis    PT Comments  Pt progressing well with post-op mobility. She was able to demonstrate transfers and ambulation with gross min assist and RW for support. Reinforced education on precautions, brace application/wearing schedule, appropriate activity progression, and car transfer. Will continue to follow.      If plan is discharge home, recommend the following: A lot of help with walking and/or transfers;A lot of help with bathing/dressing/bathroom;Assistance with cooking/housework;Assist for transportation;Help with stairs or ramp for entrance   Can travel by private vehicle     Yes  Equipment Recommendations  Rolling walker (2 wheels)    Recommendations for Other Services       Precautions / Restrictions Precautions Precautions: Fall;Back Precaution Booklet Issued: Yes (comment) Precaution Comments: Reviewed handout and pt was cued for precautions during functional mobility. Required Braces or Orthoses: Spinal Brace Spinal Brace: Thoracolumbosacral orthotic;Applied in sitting position Restrictions Weight Bearing Restrictions: No     Mobility  Bed Mobility Overal bed mobility: Needs Assistance Bed Mobility: Rolling, Sit to Sidelying Rolling: Contact guard assist       Sit to sidelying: Mod assist General bed mobility comments: VC's for optimal log roll technique. HOB flat and rails lowered to simulate home environment. Assist for LE elevation  up into bed at end of session and to reposition once in supine.    Transfers Overall transfer level: Needs assistance Equipment used: Rolling walker (2 wheels) Transfers: Sit to/from Stand Sit to Stand: Contact guard assist            General transfer comment: VC's for hand placement on seated surface for safety. Hands on guarding for safety.    Ambulation/Gait Ambulation/Gait assistance: Min assist Gait Distance (Feet): 200 Feet Assistive device: Rolling walker (2 wheels) Gait Pattern/deviations: Step-through pattern, Decreased stride length, Trunk flexed, Drifts right/left, Knees buckling Gait velocity: Decreased Gait velocity interpretation: 1.31 - 2.62 ft/sec, indicative of limited community ambulator   General Gait Details: Pt with mild buckling of knees however no overt LOB noted. Pt required occasional assist for balance and walker management.   Stairs             Wheelchair Mobility     Tilt Bed    Modified Rankin (Stroke Patients Only)       Balance Overall balance assessment: Needs assistance Sitting-balance support: Feet supported Sitting balance-Leahy Scale: Fair     Standing balance support: Single extremity supported, During functional activity Standing balance-Leahy Scale: Fair                              Cognition Arousal: Alert Behavior During Therapy: WFL for tasks assessed/performed Overall Cognitive Status: Impaired/Different from baseline Area of Impairment: Memory, Safety/judgement                     Memory: Decreased recall of precautions   Safety/Judgement: Decreased awareness of deficits, Decreased awareness of safety     General Comments: limited insight to deficits and safety        Exercises      General Comments General comments (skin integrity, edema, etc.): VSS      Pertinent Vitals/Pain Pain Assessment Pain Assessment: Faces Faces  Pain Scale: Hurts little more Pain Location: hips, back Pain Descriptors / Indicators: Discomfort Pain Intervention(s): Limited activity within patient's tolerance, Monitored during session, Repositioned    Home Living                          Prior Function             PT Goals (current goals can now be found in the care plan section) Acute Rehab PT Goals Patient Stated Goal: Decrease pain PT Goal Formulation: With patient Time For Goal Achievement: 05/13/23 Potential to Achieve Goals: Good Progress towards PT goals: Progressing toward goals    Frequency    Min 5X/week      PT Plan      Co-evaluation              AM-PAC PT "6 Clicks" Mobility   Outcome Measure  Help needed turning from your back to your side while in a flat bed without using bedrails?: A Little Help needed moving from lying on your back to sitting on the side of a flat bed without using bedrails?: A Little Help needed moving to and from a bed to a chair (including a wheelchair)?: A Little Help needed standing up from a chair using your arms (e.g., wheelchair or bedside chair)?: A Little Help needed to walk in hospital room?: A Little Help needed climbing 3-5 steps with a railing? : A Lot 6 Click Score: 17    End of Session Equipment Utilized During Treatment: Gait belt Activity Tolerance: Patient tolerated treatment well Patient left: in bed;with call bell/phone within reach Nurse Communication: Mobility status PT Visit Diagnosis: Unsteadiness on feet (R26.81);Difficulty in walking, not elsewhere classified (R26.2);Pain Pain - Right/Left:  (Bilateral) Pain - part of body: Hip     Time: 4098-1191 PT Time Calculation (min) (ACUTE ONLY): 26 min  Charges:    $Gait Training: 23-37 mins PT General Charges $$ ACUTE PT VISIT: 1 Visit                     Conni Slipper, PT, DPT Acute Rehabilitation Services Secure Chat Preferred Office: [phone]    Marylynn Pearson 05/07/2023, 11:29 AM

## 2023-05-07 NOTE — Progress Notes (Signed)
Occupational Therapy Treatment Patient Details Name: [name] MRN: [id number] DOB: [birth year] Today's Date: 05/07/2023   History of present illness [name] is a 72 yo female who underwent L5-S1 TLIF 10/23. PMHx: anxiety, arthritis, depression, DM, HLD< HTN, PONV, SVT, ulcerative colitis   OT comments  Pt is making solid progress towards their acute OT goals with improved pain management today. She needed min A for bed mobility with cues for log roll. Pt completed several sit<>stands throughout session and needed close CGA due to LE buckling and cues for hand placement with limited carry over. Mod A needed for all LB ADLs, pt reports her husband was assisting with LB ADLs prior to sx and he can still assist at d/c. OT to continue to follow acutely to facilitate progress towards established goals. Pt will [facility] to benefit from [facility] for continued therapy in the home environment due to pt being at risk for falls.        If plan is discharge home, recommend the following:  A little help with walking and/or transfers;A lot of help with bathing/dressing/bathroom;Assistance with cooking/housework;Assist for transportation;Help with stairs or ramp for entrance   Equipment Recommendations  None recommended by OT       Precautions / Restrictions Precautions Precautions: Fall;Back Precaution Booklet Issued: Yes (comment) Required Braces or Orthoses: Spinal Brace Spinal Brace: Thoracolumbosacral orthotic;Applied in sitting position Restrictions Weight Bearing Restrictions: No       Mobility Bed Mobility Overal bed mobility: Needs Assistance Bed Mobility: Rolling, Sidelying to Sit Rolling: Contact guard assist, Min assist         General bed mobility comments: cue for log roll    Transfers Overall transfer level: Needs assistance Equipment used: Rolling walker (2 wheels) Transfers: Sit to/from Stand Sit to Stand: Contact guard assist           General  transfer comment: increased time and effort, buckling noted after fatigue with last transfer of the session     Balance Overall balance assessment: Needs assistance Sitting-balance support: Feet supported Sitting balance-Leahy Scale: Fair     Standing balance support: Single extremity supported, During functional activity Standing balance-Leahy Scale: Fair Standing balance comment: grooming at the sink                   ADL either performed or assessed with clinical judgement   ADL Overall ADL's : Needs assistance/impaired Eating/Feeding: Independent   Grooming: Supervision/safety;Standing;Cueing for compensatory techniques               Lower Body Dressing: Moderate assistance;Sit to/from stand   Toilet Transfer: Electronics engineer Details (indicate cue type and reason): with cues for safety Toileting- Clothing Manipulation and Hygiene: Supervision/safety;Adhering to back precautions;Cueing for compensatory techniques       Functional mobility during ADLs: Contact guard assist General ADL Comments: increased time and effort needed for all transfers, no buckling noted    Extremity/Trunk Assessment Upper Extremity Assessment Upper Extremity Assessment: Overall WFL for tasks assessed   Lower Extremity Assessment Lower Extremity Assessment: Defer to PT evaluation        Vision   Vision Assessment?: No apparent visual deficits   Perception Perception Perception: Within Functional Limits   Praxis Praxis Praxis: WFL    Cognition Arousal: Alert Behavior During Therapy: WFL for tasks assessed/performed Overall Cognitive Status: Impaired/Different from baseline Area of Impairment: Memory, Safety/judgement  Memory: Decreased recall of precautions   Safety/Judgement: Decreased awareness of deficits, Decreased awareness of safety     General Comments: needed cues to recall precautions, limited  insight to deficits and safety              General Comments VSS    Pertinent Vitals/ Pain       Pain Assessment Pain Assessment: Faces Faces Pain Scale: Hurts little more Pain Location: hips, back Pain Descriptors / Indicators: Discomfort Pain Intervention(s): Limited activity within patient's tolerance, Monitored during session         Frequency  Min 1X/week        Progress Toward Goals  OT Goals(current goals can now be found in the care plan section)  Progress towards OT goals: Progressing toward goals  Acute Rehab OT Goals Patient Stated Goal: home OT Goal Formulation: With patient/family Time For Goal Achievement: 05/20/23 Potential to Achieve Goals: Good ADL Goals Pt Will Perform Grooming: with contact guard assist;standing Pt Will Perform Upper Body Dressing: with set-up;sitting Pt Will Perform Lower Body Dressing: with min assist;sit to/from stand Pt Will Transfer to Toilet: with contact guard assist;ambulating Additional ADL Goal #1: Pt will complete bed mobility with supervision A as a precursor to ADLs   AM-PAC OT "6 Clicks" Daily Activity     Outcome Measure   Help from another person eating meals?: None Help from another person taking care of personal grooming?: A Little Help from another person toileting, which includes using toliet, bedpan, or urinal?: A Little Help from another person bathing (including washing, rinsing, drying)?: A Lot Help from another person to put on and taking off regular upper body clothing?: A Little Help from another person to put on and taking off regular lower body clothing?: A Lot 6 Click Score: 17    End of Session Equipment Utilized During Treatment: Rolling walker (2 wheels);Back brace  OT Visit Diagnosis: Unsteadiness on feet (R26.81);Other abnormalities of gait and mobility (R26.89);Muscle weakness (generalized) (M62.81);Pain   Activity Tolerance Patient tolerated treatment well   Patient Left in chair;with  bed alarm set   Nurse Communication Mobility status        Time: 5638-7564 OT Time Calculation (min): 12 min  Charges: OT General Charges $OT Visit: 1 Visit OT Treatments $Self Care/Home Management : 8-22 mins  Derenda Mis, OTR/L Acute Rehabilitation Services Office [phone] Secure Chat Communication Preferred   Donia Pounds 05/07/2023, 10:40 AM

## 2023-05-12 NOTE — Discharge Summary (Signed)
Patient ID: [name] MRN: [id number] DOB/AGE: 1951-01-28 72 y.o.  Admit date: 05/05/2023 Discharge date: 05/12/2023  Admission Diagnoses:  Principal Problem:   Radiculopathy   Discharge Diagnoses:  Same  Past Medical History:  Diagnosis Date   Anxiety    Arthritis    Depression    DM (diabetes mellitus) (HCC)    Diet controlled   Dysrhythmia    SVT   Goiter    neg biopsy   Hyperlipidemia    Hypertension    Migraine headache    PONV (postoperative nausea and vomiting)    SVT (supraventricular tachycardia) (HCC)    Ulcerative colitis (HCC)     Surgeries: Procedure(s): LEFT-SIDED LUMBAR FIVE - SACRUM ONETRANSFORAMINAL LUMBAR INTERBODY FUSION AND DECOMPRESSION WITH INSTRUMENTATION AND ALLOGRAFT ITH REVISION INSTRUMENTATION on 05/05/2023   Consultants: None  Discharged Condition: Improved  Hospital Course: [name] is an 72 y.o. female who was admitted 05/05/2023 for operative treatment of Radiculopathy. Patient has severe unremitting pain that affects sleep, daily activities, and work/hobbies. After pre-op clearance the patient was taken to the operating room on 05/05/2023 and underwent  Procedure(s): LEFT-SIDED LUMBAR FIVE - SACRUM ONETRANSFORAMINAL LUMBAR INTERBODY FUSION AND DECOMPRESSION WITH INSTRUMENTATION AND ALLOGRAFT ITH REVISION INSTRUMENTATION.    Patient was given perioperative antibiotics:  Anti-infectives (From admission, onward)    Start     Dose/Rate Route Frequency Ordered Stop   05/05/23 1600  ceFAZolin (ANCEF) IVPB 2g/100 mL premix        2 g 200 mL/hr over 30 Minutes Intravenous Every 8 hours 05/05/23 1330 05/05/23 2330   05/05/23 0600  ceFAZolin (ANCEF) IVPB 2g/100 mL premix        2 g 200 mL/hr over 30 Minutes Intravenous On call to O.R. 05/05/23 3474 05/05/23 2595        Patient was given sequential compression devices, early ambulation to prevent DVT.  Patient benefited maximally from hospital stay and there were  no complications.    Recent vital signs: BP 113/76 (BP Location: Right Arm)   Pulse 93   Temp 99.6 F (37.6 C) (Oral)   Resp 20   Ht 5\' 6"  (1.676 m)   Wt 103.4 kg   SpO2 93%   BMI 36.80 kg/m    Discharge Medications:   Allergies as of 05/07/2023       Reactions   Topamax [topiramate] Anxiety   Amitriptyline    Just makes me feel sick    Amoxicillin Hives   Biaxin [clarithromycin] Hives   Codeine Nausea And Vomiting   Feel sick    Crestor [rosuvastatin Calcium]    ARM PAIN   Doxycycline Hives   Gabapentin Diarrhea   Lexapro [escitalopram]    Can not remember reaction    Penicillins Hives   Pravachol    ARM PAIN *Pravastatin    Protonix [pantoprazole]    Can not remember reaction    Ranitidine Hcl    Can not remember reaction  No longer on the market   Spironolactone    Can not remember reaction - No longer needed         Medication List     TAKE these medications    ALPRAZolam 0.5 MG tablet Commonly known as: XANAX Take 0.5 mg by mouth at bedtime as needed for anxiety or sleep.   aspirin EC 81 MG tablet Take 1 tablet (81 mg total) by mouth daily. Swallow whole.   atorvastatin 20 MG tablet Commonly known as: LIPITOR Take 20  mg by mouth daily.   colestipol 1 g tablet Commonly known as: COLESTID Take 2 g by mouth 2 (two) times daily.   diphenhydrAMINE 25 MG tablet Commonly known as: BENADRYL Take 25 mg by mouth at bedtime.   esomeprazole 20 MG capsule Commonly known as: NEXIUM Take 20 mg by mouth daily as needed (heartburn).   HYDROmorphone 2 MG tablet Commonly known as: DILAUDID Take 0.5-1 tablets (1-2 mg total) by mouth every 4 (four) hours as needed for severe pain (pain score 7-10).   methocarbamol 500 MG tablet Commonly known as: ROBAXIN Take 1-2 tablets (500-1,000 mg total) by mouth every 6 (six) hours as needed for muscle spasms.   metoprolol succinate 100 MG 24 hr tablet Commonly known as: TOPROL-XL Take 50 mg by mouth at  bedtime.   morphine 15 MG 12 hr tablet Commonly known as: MS CONTIN Take 1 tablet (15 mg total) by mouth every 12 (twelve) hours for 7 days.   multivitamin with minerals tablet Take 1 tablet by mouth daily.   Myrbetriq 50 MG Tb24 tablet Generic drug: mirabegron ER Take 50 mg by mouth daily.   PRO-BIOTIC BLEND PO Take 1 capsule by mouth at bedtime.   Probiotic (Lactobacillus) Caps Take 1 capsule by mouth daily at 6 (six) AM.   propranolol 10 MG tablet Commonly known as: INDERAL Take 10 mg by mouth daily as needed (For SVT episodes).   rizatriptan 10 MG tablet Commonly known as: MAXALT Take 10 mg by mouth daily as needed for migraine. May repeat in 2 hours if needed. For migraines   sertraline 100 MG tablet Commonly known as: ZOLOFT Take 200 mg by mouth daily.   traMADol 50 MG tablet Commonly known as: ULTRAM Take 50 mg by mouth at bedtime. May take an additional dose if needed throughout the day   zolpidem 10 MG tablet Commonly known as: AMBIEN Take 10 mg by mouth at bedtime.        Diagnostic Studies: DG Lumbar Spine 2-3 Views  Result Date: 05/05/2023 CLINICAL DATA:  Elective surgery. EXAM: LUMBAR SPINE - 2-3 VIEW COMPARISON:  None Available. FINDINGS: Two fluoroscopic spot views of the lumbar spine obtained in the operating room. Posterior rod with intrapedicular screw fusion L5-S1 with interbody spacer. Fluoroscopy time 31 seconds. Dose 29.79 mGy. IMPRESSION: Intraoperative fluoroscopy during L5-S1 fusion. Electronically Signed   By: Narda Rutherford M.D.   On: 05/05/2023 14:35   DG Lumbar Spine 1 View  Result Date: 05/05/2023 CLINICAL DATA:  Intraoperative localization EXAM: LUMBAR SPINE - 1 VIEW COMPARISON:  Prior CT scan of the lumbar spine 04/23/2023; prior lumbar spine radiographs 11/06/2020 FINDINGS: Cross-table lateral intraoperative localization radiograph is submitted for review. Prior surgical changes of PLIF spans L3-L5. Needle markers are present in  the posterior soft tissues directed toward the L5-S1 interspace and toward the L2 vertebral body. IMPRESSION: Intraoperative localization radiographs as above. Electronically Signed   By: Malachy Moan M.D.   On: 05/05/2023 13:31   DG C-Arm 1-60 Min-No Report  Result Date: 05/05/2023 Fluoroscopy was utilized by the requesting physician.  No radiographic interpretation.   DG C-Arm 1-60 Min-No Report  Result Date: 05/05/2023 Fluoroscopy was utilized by the requesting physician.  No radiographic interpretation.    Disposition: Discharge disposition: 01-Home or Self Care       Discharge Instructions     Discharge patient   Complete by: As directed    After PT   Discharge disposition: 01-Home or Self Care   Discharge  patient date: 05/07/2023       POD #2 s/p Lumbar fusion with Improved pain   - up with PT/OT, encourage ambulation - Dilaudid and Oxycontin for pain, Robaxin for muscle spasms -Scripts for pain sent to pharmacy electronically  -D/C instructions sheet printed and in chart -D/C today  -F/U in office 2 weeks   Signed: Eilene Ghazi Raquell Richer 05/12/2023, 1:09 PM

## 2023-05-19 DIAGNOSIS — N3946 Mixed incontinence: Secondary | ICD-10-CM | POA: Diagnosis not present

## 2023-05-19 DIAGNOSIS — R350 Frequency of micturition: Secondary | ICD-10-CM | POA: Diagnosis not present

## 2023-06-06 DIAGNOSIS — Z23 Encounter for immunization: Secondary | ICD-10-CM | POA: Diagnosis not present

## 2023-06-14 DIAGNOSIS — M545 Low back pain, unspecified: Secondary | ICD-10-CM | POA: Diagnosis not present

## 2023-07-16 DIAGNOSIS — R350 Frequency of micturition: Secondary | ICD-10-CM | POA: Diagnosis not present

## 2023-07-16 DIAGNOSIS — N3941 Urge incontinence: Secondary | ICD-10-CM | POA: Diagnosis not present

## 2023-07-23 DIAGNOSIS — M79672 Pain in left foot: Secondary | ICD-10-CM | POA: Diagnosis not present

## 2023-07-27 DIAGNOSIS — M7061 Trochanteric bursitis, right hip: Secondary | ICD-10-CM | POA: Diagnosis not present

## 2023-07-27 DIAGNOSIS — M545 Low back pain, unspecified: Secondary | ICD-10-CM | POA: Diagnosis not present

## 2023-08-05 DIAGNOSIS — Z1231 Encounter for screening mammogram for malignant neoplasm of breast: Secondary | ICD-10-CM | POA: Diagnosis not present

## 2023-08-12 DIAGNOSIS — R922 Inconclusive mammogram: Secondary | ICD-10-CM | POA: Diagnosis not present

## 2023-08-16 DIAGNOSIS — M79672 Pain in left foot: Secondary | ICD-10-CM | POA: Diagnosis not present

## 2023-08-16 DIAGNOSIS — M79662 Pain in left lower leg: Secondary | ICD-10-CM | POA: Diagnosis not present

## 2023-08-23 DIAGNOSIS — M545 Low back pain, unspecified: Secondary | ICD-10-CM | POA: Diagnosis not present

## 2023-08-25 DIAGNOSIS — N3946 Mixed incontinence: Secondary | ICD-10-CM | POA: Diagnosis not present

## 2023-08-25 DIAGNOSIS — R350 Frequency of micturition: Secondary | ICD-10-CM | POA: Diagnosis not present

## 2023-08-26 DIAGNOSIS — M8588 Other specified disorders of bone density and structure, other site: Secondary | ICD-10-CM | POA: Diagnosis not present

## 2023-08-27 DIAGNOSIS — M545 Low back pain, unspecified: Secondary | ICD-10-CM | POA: Diagnosis not present

## 2023-08-27 DIAGNOSIS — M6281 Muscle weakness (generalized): Secondary | ICD-10-CM | POA: Diagnosis not present

## 2023-08-30 DIAGNOSIS — M6281 Muscle weakness (generalized): Secondary | ICD-10-CM | POA: Diagnosis not present

## 2023-08-30 DIAGNOSIS — M545 Low back pain, unspecified: Secondary | ICD-10-CM | POA: Diagnosis not present

## 2023-09-02 DIAGNOSIS — M6281 Muscle weakness (generalized): Secondary | ICD-10-CM | POA: Diagnosis not present

## 2023-09-02 DIAGNOSIS — M545 Low back pain, unspecified: Secondary | ICD-10-CM | POA: Diagnosis not present

## 2023-09-06 DIAGNOSIS — M79672 Pain in left foot: Secondary | ICD-10-CM | POA: Diagnosis not present

## 2023-09-07 DIAGNOSIS — M545 Low back pain, unspecified: Secondary | ICD-10-CM | POA: Diagnosis not present

## 2023-09-07 DIAGNOSIS — M6281 Muscle weakness (generalized): Secondary | ICD-10-CM | POA: Diagnosis not present

## 2023-09-10 DIAGNOSIS — M6281 Muscle weakness (generalized): Secondary | ICD-10-CM | POA: Diagnosis not present

## 2023-09-10 DIAGNOSIS — M545 Low back pain, unspecified: Secondary | ICD-10-CM | POA: Diagnosis not present

## 2023-09-14 DIAGNOSIS — M6281 Muscle weakness (generalized): Secondary | ICD-10-CM | POA: Diagnosis not present

## 2023-09-14 DIAGNOSIS — M545 Low back pain, unspecified: Secondary | ICD-10-CM | POA: Diagnosis not present

## 2023-09-20 DIAGNOSIS — G629 Polyneuropathy, unspecified: Secondary | ICD-10-CM | POA: Diagnosis not present

## 2023-09-27 DIAGNOSIS — M545 Low back pain, unspecified: Secondary | ICD-10-CM | POA: Diagnosis not present

## 2023-09-27 DIAGNOSIS — M79662 Pain in left lower leg: Secondary | ICD-10-CM | POA: Diagnosis not present

## 2023-09-30 ENCOUNTER — Encounter: Payer: Self-pay | Admitting: Family Medicine

## 2023-09-30 DIAGNOSIS — E119 Type 2 diabetes mellitus without complications: Secondary | ICD-10-CM | POA: Diagnosis not present

## 2023-09-30 DIAGNOSIS — I10 Essential (primary) hypertension: Secondary | ICD-10-CM | POA: Diagnosis not present

## 2023-09-30 DIAGNOSIS — G250 Essential tremor: Secondary | ICD-10-CM | POA: Diagnosis not present

## 2023-09-30 DIAGNOSIS — G8929 Other chronic pain: Secondary | ICD-10-CM | POA: Diagnosis not present

## 2023-09-30 DIAGNOSIS — E7800 Pure hypercholesterolemia, unspecified: Secondary | ICD-10-CM | POA: Diagnosis not present

## 2023-09-30 DIAGNOSIS — K219 Gastro-esophageal reflux disease without esophagitis: Secondary | ICD-10-CM | POA: Diagnosis not present

## 2023-09-30 DIAGNOSIS — F5101 Primary insomnia: Secondary | ICD-10-CM | POA: Diagnosis not present

## 2023-09-30 DIAGNOSIS — G43109 Migraine with aura, not intractable, without status migrainosus: Secondary | ICD-10-CM | POA: Diagnosis not present

## 2023-09-30 DIAGNOSIS — F419 Anxiety disorder, unspecified: Secondary | ICD-10-CM | POA: Diagnosis not present

## 2023-09-30 DIAGNOSIS — K580 Irritable bowel syndrome with diarrhea: Secondary | ICD-10-CM | POA: Diagnosis not present

## 2023-10-01 ENCOUNTER — Other Ambulatory Visit: Payer: Self-pay | Admitting: Family Medicine

## 2023-10-01 DIAGNOSIS — M545 Low back pain, unspecified: Secondary | ICD-10-CM | POA: Diagnosis not present

## 2023-10-01 DIAGNOSIS — T148XXA Other injury of unspecified body region, initial encounter: Secondary | ICD-10-CM

## 2023-10-01 DIAGNOSIS — M6281 Muscle weakness (generalized): Secondary | ICD-10-CM | POA: Diagnosis not present

## 2023-10-04 DIAGNOSIS — M79662 Pain in left lower leg: Secondary | ICD-10-CM | POA: Diagnosis not present

## 2023-10-06 DIAGNOSIS — M545 Low back pain, unspecified: Secondary | ICD-10-CM | POA: Diagnosis not present

## 2023-10-06 DIAGNOSIS — M6281 Muscle weakness (generalized): Secondary | ICD-10-CM | POA: Diagnosis not present

## 2023-10-07 DIAGNOSIS — N3946 Mixed incontinence: Secondary | ICD-10-CM | POA: Diagnosis not present

## 2023-10-07 DIAGNOSIS — R350 Frequency of micturition: Secondary | ICD-10-CM | POA: Diagnosis not present

## 2023-10-11 DIAGNOSIS — E119 Type 2 diabetes mellitus without complications: Secondary | ICD-10-CM | POA: Diagnosis not present

## 2023-10-11 DIAGNOSIS — I10 Essential (primary) hypertension: Secondary | ICD-10-CM | POA: Diagnosis not present

## 2023-10-11 DIAGNOSIS — E7800 Pure hypercholesterolemia, unspecified: Secondary | ICD-10-CM | POA: Diagnosis not present

## 2023-10-12 ENCOUNTER — Ambulatory Visit
Admission: RE | Admit: 2023-10-12 | Discharge: 2023-10-12 | Disposition: A | Discharge status: Home / Self Care | Source: Ambulatory Visit | Attending: Family Medicine | Admitting: Family Medicine

## 2023-10-12 DIAGNOSIS — T148XXA Other injury of unspecified body region, initial encounter: Secondary | ICD-10-CM

## 2023-10-12 DIAGNOSIS — R600 Localized edema: Secondary | ICD-10-CM | POA: Diagnosis not present

## 2023-10-12 DIAGNOSIS — M21372 Foot drop, left foot: Secondary | ICD-10-CM | POA: Diagnosis not present

## 2023-10-14 DIAGNOSIS — M545 Low back pain, unspecified: Secondary | ICD-10-CM | POA: Diagnosis not present

## 2023-10-14 DIAGNOSIS — M6281 Muscle weakness (generalized): Secondary | ICD-10-CM | POA: Diagnosis not present

## 2023-10-20 DIAGNOSIS — M79662 Pain in left lower leg: Secondary | ICD-10-CM | POA: Diagnosis not present

## 2023-10-21 DIAGNOSIS — M545 Low back pain, unspecified: Secondary | ICD-10-CM | POA: Diagnosis not present

## 2023-10-21 DIAGNOSIS — M6281 Muscle weakness (generalized): Secondary | ICD-10-CM | POA: Diagnosis not present

## 2023-10-22 DIAGNOSIS — M5416 Radiculopathy, lumbar region: Secondary | ICD-10-CM | POA: Diagnosis not present

## 2023-10-25 ENCOUNTER — Other Ambulatory Visit: Payer: Self-pay | Admitting: Orthopedic Surgery

## 2023-10-25 DIAGNOSIS — M5416 Radiculopathy, lumbar region: Secondary | ICD-10-CM

## 2023-10-27 DIAGNOSIS — M545 Low back pain, unspecified: Secondary | ICD-10-CM | POA: Diagnosis not present

## 2023-10-27 DIAGNOSIS — M6281 Muscle weakness (generalized): Secondary | ICD-10-CM | POA: Diagnosis not present

## 2023-10-27 NOTE — Discharge Instructions (Signed)

## 2023-10-29 ENCOUNTER — Ambulatory Visit
Admission: RE | Admit: 2023-10-29 | Discharge: 2023-10-29 | Disposition: A | Discharge status: Home / Self Care | Source: Ambulatory Visit | Attending: Orthopedic Surgery | Admitting: Orthopedic Surgery

## 2023-10-29 DIAGNOSIS — M4807 Spinal stenosis, lumbosacral region: Secondary | ICD-10-CM | POA: Diagnosis not present

## 2023-10-29 DIAGNOSIS — Z981 Arthrodesis status: Secondary | ICD-10-CM | POA: Diagnosis not present

## 2023-10-29 DIAGNOSIS — M5416 Radiculopathy, lumbar region: Secondary | ICD-10-CM | POA: Diagnosis not present

## 2023-10-29 DIAGNOSIS — M48061 Spinal stenosis, lumbar region without neurogenic claudication: Secondary | ICD-10-CM | POA: Diagnosis not present

## 2023-10-29 MED ORDER — MEPERIDINE HCL 50 MG/ML IJ SOLN: 50.0000 mg | Freq: Once | INTRAMUSCULAR | Status: DC | PRN

## 2023-10-29 MED ORDER — ONDANSETRON HCL 4 MG/2ML IJ SOLN: 4.0000 mg | Freq: Once | INTRAMUSCULAR | Status: DC | PRN

## 2023-10-29 MED ORDER — IOPAMIDOL (ISOVUE-M 200) INJECTION 41%
20.0000 mL | Freq: Once | INTRAMUSCULAR | Status: AC
  Administered 2023-10-29: 20 mL via INTRATHECAL

## 2023-10-29 MED ORDER — DIAZEPAM 5 MG PO TABS
5.0000 mg | ORAL_TABLET | Freq: Once | ORAL | Status: AC
Start: 2023-10-29 — End: 2023-10-29
  Administered 2023-10-29: 5 mg via ORAL

## 2023-11-05 DIAGNOSIS — M545 Low back pain, unspecified: Secondary | ICD-10-CM | POA: Diagnosis not present

## 2023-11-09 DIAGNOSIS — I10 Essential (primary) hypertension: Secondary | ICD-10-CM | POA: Diagnosis not present

## 2023-11-09 DIAGNOSIS — E119 Type 2 diabetes mellitus without complications: Secondary | ICD-10-CM | POA: Diagnosis not present

## 2023-11-10 DIAGNOSIS — E7800 Pure hypercholesterolemia, unspecified: Secondary | ICD-10-CM | POA: Diagnosis not present

## 2023-11-10 DIAGNOSIS — E119 Type 2 diabetes mellitus without complications: Secondary | ICD-10-CM | POA: Diagnosis not present

## 2023-11-10 DIAGNOSIS — I10 Essential (primary) hypertension: Secondary | ICD-10-CM | POA: Diagnosis not present

## 2023-11-27 ENCOUNTER — Other Ambulatory Visit: Payer: Self-pay | Admitting: Orthopedic Surgery

## 2023-12-07 NOTE — Pre-Procedure Instructions (Signed)
 Surgical Instructions   Your procedure is scheduled on Thursday, June 5th. Report to [facility] Main Entrance "A" at 05:30 A.M., then check in with the Admitting office. Any questions or running late day of surgery: call [phone]  Questions prior to your surgery date: call [phone], Monday-Friday, 8am-4pm. If you experience any cold or flu symptoms such as cough, fever, chills, shortness of breath, etc. between now and your scheduled surgery, please notify us  at the above number.     Remember:  Do not eat after midnight the night before your surgery  You may drink clear liquids until 04:30 AM the morning of your surgery.   Clear liquids allowed are: Water, Non-Citrus Juices (without pulp), Carbonated Beverages, Clear Tea (no milk, honey, etc.), Black Coffee Only (NO MILK, CREAM OR POWDERED CREAMER of any kind), and Gatorade.  Patient Instructions  The night before surgery:  No food after midnight. ONLY clear liquids after midnight   The day of surgery (if you have diabetes): Drink ONE (1) 12 oz G2 given to you in your pre admission testing appointment by 04:30 AM the morning of surgery. Drink in one sitting. Do not sip.  This drink was given to you during your hospital  pre-op appointment visit.  Nothing else to drink after completing the  12 oz bottle of G2.         If you have questions, please contact your surgeon's office.    Take these medicines the morning of surgery with A SIP OF WATER  atorvastatin  (LIPITOR)  colestipol  (COLESTID )  mirabegron  ER (MYRBETRIQ )  sertraline  (ZOLOFT )    May take these medicines IF NEEDED: esomeprazole  (NEXIUM )  hydroxypropyl methylcellulose / hypromellose (ISOPTO TEARS / GONIOVISC) eye drops PERCOCET  propranolol  (INDERAL )  rizatriptan (MAXALT)  traMADol  (ULTRAM )    Follow your surgeon's instructions on when to stop Aspirin .  If no instructions were given by your surgeon then you will need to call the office to get those  instructions.    One week prior to surgery, STOP taking any Aleve, Naproxen, Ibuprofen, Motrin, Advil, Goody's, BC's, all herbal medications, fish oil, and non-prescription vitamins.                     Do NOT Smoke (Tobacco/Vaping) for 24 hours prior to your procedure.  If you use a CPAP at night, you may bring your mask/headgear for your overnight stay.   You will be asked to remove any contacts, glasses, piercing's, hearing aid's, dentures/partials prior to surgery. Please bring cases for these items if needed.    Patients discharged the day of surgery will not be allowed to drive home, and someone needs to stay with them for 24 hours.  SURGICAL WAITING ROOM VISITATION Patients may have no more than 2 support people in the waiting area - these visitors may rotate.   Pre-op nurse will coordinate an appropriate time for 1 ADULT support person, who may not rotate, to accompany patient in pre-op.  Children under the age of 45 must have an adult with them who is not the patient and must remain in the main waiting area with an adult.  If the patient needs to stay at the hospital during part of their recovery, the visitor guidelines for inpatient rooms apply.  Please refer to the [facility] website for the visitor guidelines for any additional information.   If you received a COVID test during your pre-op visit  it is requested that you wear a mask when  out in public, stay away from anyone that may not be feeling well and notify your surgeon if you develop symptoms. If you have been in contact with anyone that has tested positive in the last 10 days please notify you surgeon.      Pre-operative 5 CHG Bathing Instructions   You can play a key role in reducing the risk of infection after surgery. Your skin needs to be as free of germs as possible. You can reduce the number of germs on your skin by washing with CHG (chlorhexidine  gluconate) soap before surgery. CHG is an antiseptic soap that  kills germs and continues to kill germs even after washing.   DO NOT use if you have an allergy to chlorhexidine /CHG or antibacterial soaps. If your skin becomes reddened or irritated, stop using the CHG and notify one of our RNs at [phone].   Please shower with the CHG soap starting 4 days before surgery using the following schedule:     Please keep in mind the following:  DO NOT shave, including legs and underarms, starting the day of your first shower.   You may shave your face at any point before/day of surgery.  Place clean sheets on your bed the day you start using CHG soap. Use a clean washcloth (not used since being washed) for each shower. DO NOT sleep with pets once you start using the CHG.   CHG Shower Instructions:  Wash your face and private area with normal soap. If you choose to wash your hair, wash first with your normal shampoo.  After you use shampoo/soap, rinse your hair and body thoroughly to remove shampoo/soap residue.  Turn the water OFF and apply about 3 tablespoons (45 ml) of CHG soap to a CLEAN washcloth.  Apply CHG soap ONLY FROM YOUR NECK DOWN TO YOUR TOES (washing for 3-5 minutes)  DO NOT use CHG soap on face, private areas, open wounds, or sores.  Pay special attention to the area where your surgery is being performed.  If you are having back surgery, having someone wash your back for you may be helpful. Wait 2 minutes after CHG soap is applied, then you may rinse off the CHG soap.  Pat dry with a clean towel  Put on clean clothes/pajamas   If you choose to wear lotion, please use ONLY the CHG-compatible lotions that are listed below.  Additional instructions for the day of surgery: DO NOT APPLY any lotions, deodorants, cologne, or perfumes.   Do not bring valuables to the hospital. [facility] is not responsible for any belongings/valuables. Do not wear nail polish, gel polish, artificial nails, or any other type of covering on natural nails (fingers  and toes) Do not wear jewelry or makeup Put on clean/comfortable clothes.  Please brush your teeth.  Ask your nurse before applying any prescription medications to the skin.     CHG Compatible Lotions   Aveeno Moisturizing lotion  Cetaphil Moisturizing Cream  Cetaphil Moisturizing Lotion  Clairol Herbal Essence Moisturizing Lotion, Dry Skin  Clairol Herbal Essence Moisturizing Lotion, Extra Dry Skin  Clairol Herbal Essence Moisturizing Lotion, Normal Skin  Curel Age Defying Therapeutic Moisturizing Lotion with Alpha Hydroxy  Curel Extreme Care Body Lotion  Curel Soothing Hands Moisturizing Hand Lotion  Curel Therapeutic Moisturizing Cream, Fragrance-Free  Curel Therapeutic Moisturizing Lotion, Fragrance-Free  Curel Therapeutic Moisturizing Lotion, Original Formula  Eucerin Daily Replenishing Lotion  Eucerin Dry Skin Therapy Plus Alpha Hydroxy Crme  Eucerin Dry Skin Therapy Plus Alpha  Hydroxy Lotion  Eucerin Original Crme  Eucerin Original Lotion  Eucerin Plus Crme Eucerin Plus Lotion  Eucerin TriLipid Replenishing Lotion  Keri Anti-Bacterial Hand Lotion  Keri Deep Conditioning Original Lotion Dry Skin Formula Softly Scented  Keri Deep Conditioning Original Lotion, Fragrance Free Sensitive Skin Formula  Keri Lotion Fast Absorbing Fragrance Free Sensitive Skin Formula  Keri Lotion Fast Absorbing Softly Scented Dry Skin Formula  Keri Original Lotion  Keri Skin Renewal Lotion Keri Silky Smooth Lotion  Keri Silky Smooth Sensitive Skin Lotion  Nivea Body Creamy Conditioning Oil  Nivea Body Extra Enriched Lotion  Nivea Body Original Lotion  Nivea Body Sheer Moisturizing Lotion Nivea Crme  Nivea Skin Firming Lotion  NutraDerm 30 Skin Lotion  NutraDerm Skin Lotion  NutraDerm Therapeutic Skin Cream  NutraDerm Therapeutic Skin Lotion  ProShield Protective Hand Cream  Provon moisturizing lotion  Please read over the following fact sheets that you were given.

## 2023-12-08 ENCOUNTER — Encounter (HOSPITAL_COMMUNITY): Payer: Self-pay

## 2023-12-08 ENCOUNTER — Other Ambulatory Visit: Payer: Self-pay

## 2023-12-08 ENCOUNTER — Encounter (HOSPITAL_COMMUNITY)
Admission: RE | Admit: 2023-12-08 | Discharge: 2023-12-08 | Disposition: A | Discharge status: Home / Self Care | Source: Ambulatory Visit | Attending: Orthopedic Surgery | Admitting: Orthopedic Surgery

## 2023-12-08 VITALS — BP 127/54 | HR 83 | Temp 98.4°F | Resp 18 | Ht 66.0 in | Wt 218.4 lb

## 2023-12-08 DIAGNOSIS — Z79899 Other long term (current) drug therapy: Secondary | ICD-10-CM | POA: Insufficient documentation

## 2023-12-08 DIAGNOSIS — M960 Pseudarthrosis after fusion or arthrodesis: Secondary | ICD-10-CM | POA: Diagnosis not present

## 2023-12-08 DIAGNOSIS — E785 Hyperlipidemia, unspecified: Secondary | ICD-10-CM | POA: Insufficient documentation

## 2023-12-08 DIAGNOSIS — E049 Nontoxic goiter, unspecified: Secondary | ICD-10-CM | POA: Diagnosis not present

## 2023-12-08 DIAGNOSIS — E119 Type 2 diabetes mellitus without complications: Secondary | ICD-10-CM | POA: Diagnosis not present

## 2023-12-08 DIAGNOSIS — I10 Essential (primary) hypertension: Secondary | ICD-10-CM | POA: Diagnosis not present

## 2023-12-08 DIAGNOSIS — Z7982 Long term (current) use of aspirin: Secondary | ICD-10-CM | POA: Diagnosis not present

## 2023-12-08 DIAGNOSIS — K5190 Ulcerative colitis, unspecified, without complications: Secondary | ICD-10-CM | POA: Diagnosis not present

## 2023-12-08 DIAGNOSIS — Z96651 Presence of right artificial knee joint: Secondary | ICD-10-CM | POA: Diagnosis not present

## 2023-12-08 DIAGNOSIS — Z01818 Encounter for other preprocedural examination: Secondary | ICD-10-CM

## 2023-12-08 DIAGNOSIS — I2510 Atherosclerotic heart disease of native coronary artery without angina pectoris: Secondary | ICD-10-CM | POA: Insufficient documentation

## 2023-12-08 DIAGNOSIS — Z6835 Body mass index (BMI) 35.0-35.9, adult: Secondary | ICD-10-CM | POA: Diagnosis not present

## 2023-12-08 DIAGNOSIS — E669 Obesity, unspecified: Secondary | ICD-10-CM | POA: Insufficient documentation

## 2023-12-08 DIAGNOSIS — Z01812 Encounter for preprocedural laboratory examination: Secondary | ICD-10-CM | POA: Diagnosis not present

## 2023-12-08 HISTORY — DX: Fibromyalgia: M79.7

## 2023-12-08 HISTORY — DX: Iodine-deficiency related diffuse (endemic) goiter: E01.0

## 2023-12-08 HISTORY — DX: Failed or difficult intubation, initial encounter: T88.4XXA

## 2023-12-08 LAB — BASIC METABOLIC PANEL WITH GFR
Anion gap: 7 (ref 5–15)
BUN: 15 mg/dL (ref 8–23)
CO2: 28 mmol/L (ref 22–32)
Calcium: 9.5 mg/dL (ref 8.9–10.3)
Chloride: 104 mmol/L (ref 98–111)
Creatinine, Ser: 0.91 mg/dL (ref 0.44–1.00)
GFR, Estimated: >60 mL/min (ref >60)
Glucose, Bld: 133 mg/dL — ABNORMAL HIGH (ref 70–99)
Potassium: 3.9 mmol/L (ref 3.5–5.1)
Sodium: 139 mmol/L (ref 135–145)

## 2023-12-08 LAB — CBC
HCT: 39.9 % (ref 36.0–46.0)
Hemoglobin: 13 g/dL (ref 12.0–15.0)
MCH: 30.2 pg (ref 26.0–34.0)
MCHC: 32.6 g/dL (ref 30.0–36.0)
MCV: 92.8 fL (ref 80.0–100.0)
Platelets: 257 10*3/uL (ref 150–400)
RBC: 4.3 MIL/uL (ref 3.87–5.11)
RDW: 14.5 % (ref 11.5–15.5)
WBC: 8.4 10*3/uL (ref 4.0–10.5)
nRBC: 0 % (ref 0.0–0.2)

## 2023-12-08 LAB — SURGICAL PCR SCREEN
MRSA, PCR: NEGATIVE
Staphylococcus aureus: POSITIVE — AB

## 2023-12-08 LAB — TYPE AND SCREEN
ABO/RH(D): O POS
Antibody Screen: NEGATIVE

## 2023-12-08 LAB — HEMOGLOBIN A1C
Hgb A1c MFr Bld: 5.6 % (ref 4.8–5.6)
Mean Plasma Glucose: 114.02 mg/dL

## 2023-12-08 LAB — GLUCOSE, CAPILLARY: Glucose-Capillary: 140 mg/dL — ABNORMAL HIGH (ref 70–99)

## 2023-12-08 NOTE — Progress Notes (Signed)
 PCP - Dr. Elyn Han Cardiologist - Dr. Ahmad Alert  PPM/ICD - denies   Chest x-ray - 12/13/21 EKG - 04/28/23 Stress Test - 11/05/2003 ECHO - 12/14/21 Cardiac Cath - denies  Sleep Study - denies  DM- pt says she only checks her CBG about once a month. She does not know her typical fasting levels  Last dose of GLP1 agonist-  n/a   Blood Thinner Instructions: n/a Aspirin  Instructions: f/u with surgeon  ERAS Protcol - clears until 0430 PRE-SURGERY G2- given  COVID TEST- n/a   Anesthesia review: yes, difficult airway flag, cardiac hx  Patient denies shortness of breath, fever, cough and chest pain at PAT appointment   All instructions explained to the patient, with a verbal understanding of the material. Patient agrees to go over the instructions while at home for a better understanding.  The opportunity to ask questions was provided.

## 2023-12-08 NOTE — Progress Notes (Signed)
   12/08/23 1012  OBSTRUCTIVE SLEEP APNEA  Have you ever been diagnosed with sleep apnea through a sleep study? No  Do you snore loudly (loud enough to be heard through closed doors)?  0  Do you often feel tired, fatigued, or sleepy during the daytime (such as falling asleep during driving or talking to someone)? 1  Has anyone observed you stop breathing during your sleep? 0  Do you have, or are you being treated for high blood pressure? 1  BMI more than 35 kg/m2? 1  Age > 50 (1-yes) 1  Neck circumference greater than:Female 16 inches or larger, Female 17inches or larger? 1  Female Gender (Yes=1) 0  Obstructive Sleep Apnea Score 5  Score 5 or greater  Results sent to PCP

## 2023-12-09 DIAGNOSIS — E119 Type 2 diabetes mellitus without complications: Secondary | ICD-10-CM | POA: Diagnosis not present

## 2023-12-09 DIAGNOSIS — I10 Essential (primary) hypertension: Secondary | ICD-10-CM | POA: Diagnosis not present

## 2023-12-09 NOTE — Progress Notes (Signed)
 Anesthesia Chart Review:  Case: [id number] Date/Time: 12/16/23 0715   Procedure: POSTERIOR LUMBAR FUSION 1 LEVEL - LUMBAR 5 - SACRUM 1 REVISION POSTERIORFUSION WITH INSTRUMENTATION AND ALLOGRAFT WITH BONE MORPHOGENIC PROTEIN   Anesthesia type: General   Pre-op diagnosis: LOW BACK PAIN,  NONUNION   [facility] ROOM 05 / [facility]   Surgeons: Rachael Grimes, MD       DISCUSSION:  Patient is a 73 year old female scheduled for the above procedure.   History includes never smoker, post-operative N/V, DIFFICULT INTUBATION, SVT (on b-blocker therapy), HTN, HLD, diet controlled DM, ulcerative colitis, goiter (multinodular goiter, with benign cytology per 01/26/17 ENT note by [name], MD), osteoarthritis (right TKA 03/23/11), spinal surgery (L3-5 TLIF/posterolateral fusion 11/06/20; left L5-S1 TLIF, right L5-S1 posterolateral fusion, removal L3-4 instrumentation 05/05/23). 01/02/22 CCTA showed Coronary calcium  score of 80.1 (72nd percentile) with minimal CAD < 25% LAD and RCA.  BMI is consistent with obesity. OSA screening score was 5.    She was noted to be a DIFFICULT INTUBATION on 05/05/23. Miller and 2 were used to place 7.5 mm ETT on 11/06/20. On 05/05/23 exam, difficult airway was anticipated due to anterior larynx and limited oral opening. Per notes, IV induction, mask ventilation without difficulty, Grade 1 view, 3 attempts to place 7.0 mm ETT. Success with Glidescope and 3 (Attempt x 1 by CRNA w/ esoph intubation. Attempt x 1 by MDA unsuccessful with Mil 2. GS intubation x 1 attempt without further difficulty).  She had cardiology evaluation with Dr. Alroy Aspen in June 2023 following ED visit for chest pain. She subsequently underwent an outpatient coronary CTA that showed a calcium  score of 80 with minimal nonobstructive CAD present. 2D echo showed normal EF of 60 to 65% with no RWMA and mild LVH no significant valve abnormalities. Last cardiology follow-up was on 04/28/23 with [name], NP for  follow-up and preoperative evaluation pror to 05/05/23 lumbar fusion. She was felt acceptable risk for that procedure. As needed cardiology follow-up recommended.    A1c 5.6% on 12/08/23.    Anesthesia team to evaluate on the day of surgery.     VS: BP (!) 127/54   Pulse 83   Temp 36.9 C   Resp 18   Ht 5\' 6"  (1.676 m)   Wt 99.1 kg   SpO2 94%   BMI 35.25 kg/m   PROVIDERS: Roselind Congo, MD is PCP  Ahmad Alert, MD is cardiologist. As needed cardiology follow-up recommended at 04/28/23 APP visit.    LABS: Labs reviewed: Acceptable for surgery. (all labs ordered are listed, but only abnormal results are displayed)  Labs Reviewed  SURGICAL PCR SCREEN - Abnormal; Notable for the following components:      Result Value   Staphylococcus aureus POSITIVE (*)    All other components within normal limits  GLUCOSE, CAPILLARY - Abnormal; Notable for the following components:   Glucose-Capillary 140 (*)    All other components within normal limits  BASIC METABOLIC PANEL WITH GFR - Abnormal; Notable for the following components:   Glucose, Bld 133 (*)    All other components within normal limits  HEMOGLOBIN A1C  CBC  TYPE AND SCREEN     IMAGES: CT L-spine 10/29/23: IMPRESSION: 1. Interval L5-S1 fusion with subsidence of the interbody spacer and no solid osseous fusion. Moderate residual spinal stenosis and effacement of the left lateral recess and left S1 nerve root. 2. Solid L3-L5 fusion. Mild to moderate residual spinal stenosis below the L4-5 disc  level with asymmetric left lateral recess stenosis and left L5 nerve root effacement. 3. Mild spinal stenosis at L1-2. 4.  Aortic Atherosclerosis (ICD10-I70.0).    EKG:  04/28/23 ([facility]):  Normal sinus rhythm Right bundle branch block and TWI leads V2 III When compared with ECG of 14-Dec-2021 09:00, T wave inversion now evident in Anterior leads Confirmed by [name] [phone]) on 04/28/2023 3:45:14 PM      CV: CT Coronary 01/02/22: FINDINGS:  - Coronary Arteries:  Normal coronary origin.  Right dominance. - Left main: The left main is a large caliber vessel with a normal take off from the left coronary cusp that trifurcates into a LAD, LCX, and ramus intermedius. There is minimal calcified plaque (<25%). - Left anterior descending artery: There is minimal calcified plaque (<25%) in the proximal, mid and distal segments. The LAD gives off 1 diagonal with minimal calcified plaque (<25%). - Ramus intermedius: Patent with no evidence of plaque or stenosis. - Left circumflex artery: The LCX is non-dominant and patent with no evidence of plaque or stenosis. The LCX gives off 1 patent obtuse marginal branch. - Right coronary artery: The RCA is dominant with normal take off from the right coronary cusp. There is minimal mixed density plaque (<25%) in the proximal segment. The mid and distal segments are patent. The RCA terminates as a PDA and right posterolateral branch without evidence of plaque or stenosis. - Right Atrium: Right atrial size is within normal limits. - Right Ventricle: The right ventricular cavity is within normal limits. - Left Atrium: Left atrial size is normal in size with no left atrial appendage filling defect. - Left Ventricle: The ventricular cavity size is within normal limits. - Pulmonary arteries: Normal in size without proximal filling defect. - Pulmonary veins: Normal pulmonary venous drainage. - Pericardium: Normal thickness without significant effusion or calcium  present. - Cardiac valves: The aortic valve is trileaflet without significant calcification. The mitral valve is normal without significant calcification. - Aorta: Normal caliber without significant disease.   IMPRESSION: 1. Coronary calcium  score of 80.1. This was 72nd percentile for age-, sex, and race-matched controls. 2. Normal coronary origin with right dominance. 3. Minimal CAD (<25%) in the  LAD/RCA.   RECOMMENDATIONS: 1. Minimal non-obstructive CAD (0-24%). Consider non-atherosclerotic causes of chest pain. Consider preventive therapy and risk factor modification.     Echo 12/14/21: IMPRESSIONS   1. Left ventricular ejection fraction, by estimation, is 60 to 65%. The  left ventricle has normal function. The left ventricle has no regional  wall motion abnormalities. There is mild left ventricular hypertrophy.  Left ventricular diastolic parameters  are indeterminate.   2. Right ventricular systolic function is normal. The right ventricular  size is mildly enlarged. There is normal pulmonary artery systolic  pressure. The estimated right ventricular systolic pressure is 26.8 mmHg.   3. The mitral valve is normal in structure. Trivial mitral valve  regurgitation. No evidence of mitral stenosis.   4. The aortic valve is tricuspid. Aortic valve regurgitation is not  visualized. No aortic stenosis is present.   5. The inferior vena cava is normal in size with greater than 50%  respiratory variability, suggesting right atrial pressure of 3 mmHg.    Past Medical History:  Diagnosis Date   Anxiety    Arthritis    Depression    Difficult intubation    used Glidescope   DM (diabetes mellitus) (HCC)    Diet controlled   Dysrhythmia    SVT   Fibromyalgia  Goiter    neg biopsy   Hyperlipidemia    Hypertension    Migraine headache    PONV (postoperative nausea and vomiting)    SVT (supraventricular tachycardia) (HCC)    Thyromegaly    stable, mild with bilateral nodules   Ulcerative colitis (HCC)     Past Surgical History:  Procedure Laterality Date   ABDOMINAL HYSTERECTOMY     ACHILLES TENDON REPAIR Right    x2   APPENDECTOMY     CARDIOVASCULAR STRESS TEST  11/05/2003   EF 68%   CHOLECYSTECTOMY     DOPPLER ECHOCARDIOGRAPHY  02/11/2000   EF 60-65%   FOOT SURGERY     JOINT REPLACEMENT     NOSE SURGERY     TONSILLECTOMY     TOTAL KNEE ARTHROPLASTY      right knee   TRANSFORAMINAL LUMBAR INTERBODY FUSION (TLIF) WITH PEDICLE SCREW FIXATION 1 LEVEL Left 05/05/2023   Procedure: LEFT-SIDED LUMBAR FIVE - SACRUM ONETRANSFORAMINAL LUMBAR INTERBODY FUSION AND DECOMPRESSION WITH INSTRUMENTATION AND ALLOGRAFT ITH REVISION INSTRUMENTATION;  Surgeon: Rachael Grimes, MD;  Location: [facility] OR;  Service: Orthopedics;  Laterality: Left;   TRANSFORAMINAL LUMBAR INTERBODY FUSION (TLIF) WITH PEDICLE SCREW FIXATION 2 LEVEL Right 11/06/2020   Procedure: RIGHT-SIDED LUMBAR 3-4, LUMBAR 4-5 TRANSFORAMINAL LUMBAR INTERBODY FUSION WITH INSTRUMENTATION AND ALLOGRAFT;  Surgeon: Rachael Grimes, MD;  Location: [facility] OR;  Service: Orthopedics;  Laterality: Right;    MEDICATIONS:  ALPRAZolam  (XANAX ) 0.5 MG tablet   aspirin  EC 81 MG tablet   atorvastatin  (LIPITOR) 20 MG tablet   colestipol  (COLESTID ) 1 g tablet   diphenhydrAMINE  (BENADRYL ) 25 MG tablet   esomeprazole  (NEXIUM ) 20 MG capsule   gabapentin (NEURONTIN) 300 MG capsule   HYDROmorphone  (DILAUDID ) 2 MG tablet   hydroxypropyl methylcellulose / hypromellose (ISOPTO TEARS / GONIOVISC) 2.5 % ophthalmic solution   methocarbamol  (ROBAXIN ) 500 MG tablet   metoprolol  (TOPROL -XL) 100 MG 24 hr tablet   mirabegron  ER (MYRBETRIQ ) 50 MG TB24 tablet   Multiple Vitamins-Minerals (MULTIVITAMIN WITH MINERALS) tablet   PERCOCET 5-325 MG tablet   Probiotic, Lactobacillus, CAPS   propranolol  (INDERAL ) 10 MG tablet   rizatriptan (MAXALT) 10 MG tablet   sertraline  (ZOLOFT ) 100 MG tablet   solifenacin (VESICARE) 5 MG tablet   traMADol  (ULTRAM ) 50 MG tablet   zolpidem  (AMBIEN ) 10 MG tablet   No current facility-administered medications for this encounter.  She was advised to follow-up with surgeon's office regarding perioeprative ASA instructions.   Ella Gun, PA-C Surgical Short Stay/Anesthesiology [facility] Phone [phone] [facility] Phone [phone] 12/09/2023 4:25 PM

## 2023-12-09 NOTE — Anesthesia Preprocedure Evaluation (Addendum)
 Anesthesia Evaluation  Patient identified by MRN, date of birth, ID band Patient awake    Reviewed: Allergy & Precautions, H&P , NPO status , Patient's Chart, lab work & pertinent test results  History of Anesthesia Complications (+) PONV, DIFFICULT AIRWAY and history of anesthetic complications (required glidescope)  Airway Mallampati: III  TM Distance: >3 FB Neck ROM: Full    Dental  (+) Teeth Intact, Dental Advisory Given   Pulmonary neg pulmonary ROS   Pulmonary exam normal breath sounds clear to auscultation       Cardiovascular hypertension (137/78 preop), Pt. on medications and Pt. on home beta blockers Normal cardiovascular exam+ dysrhythmias Supra Ventricular Tachycardia  Rhythm:Regular Rate:Normal  Echo 2023  1. Left ventricular ejection fraction, by estimation, is 60 to 65%. The  left ventricle has normal function. The left ventricle has no regional  wall motion abnormalities. There is mild left ventricular hypertrophy.  Left ventricular diastolic parameters  are indeterminate.   2. Right ventricular systolic function is normal. The right ventricular  size is mildly enlarged. There is normal pulmonary artery systolic  pressure. The estimated right ventricular systolic pressure is 26.8 mmHg.   3. The mitral valve is normal in structure. Trivial mitral valve  regurgitation. No evidence of mitral stenosis.   4. The aortic valve is tricuspid. Aortic valve regurgitation is not  visualized. No aortic stenosis is present.   5. The inferior vena cava is normal in size with greater than 50%  respiratory variability, suggesting right atrial pressure of 3 mmHg.      Neuro/Psych  Headaches PSYCHIATRIC DISORDERS Anxiety Depression       GI/Hepatic Neg liver ROS, PUD,,,  Endo/Other  diabetes, Well Controlled, Type 2, Oral Hypoglycemic Agents  BMI 35 A1c 5.6  Renal/GU negative Renal ROS  negative genitourinary    Musculoskeletal  (+) Arthritis , Osteoarthritis,  Fibromyalgia -, narcotic dependentTramadol 50mg  at bedtime Multiple prior back surgeries    Abdominal  (+) + obese  Peds negative pediatric ROS (+)  Hematology negative hematology ROS (+)   Anesthesia Other Findings   Reproductive/Obstetrics                             Anesthesia Physical Anesthesia Plan  ASA: 3  Anesthesia Plan: General   Post-op Pain Management: Tylenol  PO (pre-op)* and Ketamine IV*   Induction: Intravenous  PONV Risk Score and Plan: 4 or greater and Ondansetron , Dexamethasone , Midazolam  and Treatment may vary due to age or medical condition  Airway Management Planned: Oral ETT and Video Laryngoscope Planned  Additional Equipment: None  Intra-op Plan:   Post-operative Plan: Extubation in OR  Informed Consent: I have reviewed the patients History and Physical, chart, labs and discussed the procedure including the risks, benefits and alternatives for the proposed anesthesia with the patient or authorized representative who has indicated his/her understanding and acceptance.     Dental advisory given  Plan Discussed with: CRNA  Anesthesia Plan Comments: (Last airway note: Ventilation: Mask ventilation without difficulty Laryngoscope Size: Glidescope and 3 (Attempt x 1 by CRNA w/ esoph intubation. Attempt x 1 by MDA unsuccessful with Mil 2. GS intubation x 1 attempt without further difficulty) Grade View: Grade I Tube type: Oral Tube size: 7.0 mm  )       Anesthesia Quick Evaluation

## 2023-12-11 DIAGNOSIS — E7800 Pure hypercholesterolemia, unspecified: Secondary | ICD-10-CM | POA: Diagnosis not present

## 2023-12-11 DIAGNOSIS — E119 Type 2 diabetes mellitus without complications: Secondary | ICD-10-CM | POA: Diagnosis not present

## 2023-12-11 DIAGNOSIS — I10 Essential (primary) hypertension: Secondary | ICD-10-CM | POA: Diagnosis not present

## 2023-12-16 ENCOUNTER — Inpatient Hospital Stay (HOSPITAL_COMMUNITY)

## 2023-12-16 ENCOUNTER — Other Ambulatory Visit: Payer: Self-pay

## 2023-12-16 ENCOUNTER — Encounter (HOSPITAL_COMMUNITY): Payer: Self-pay | Admitting: Orthopedic Surgery

## 2023-12-16 ENCOUNTER — Inpatient Hospital Stay (HOSPITAL_COMMUNITY): Payer: Self-pay | Admitting: Anesthesiology

## 2023-12-16 ENCOUNTER — Inpatient Hospital Stay (HOSPITAL_COMMUNITY)
Admission: RE | Admit: 2023-12-16 | Discharge: 2023-12-17 | DRG: 451 | Disposition: A | Discharge status: Home / Self Care | Attending: Orthopedic Surgery | Admitting: Orthopedic Surgery

## 2023-12-16 ENCOUNTER — Encounter (HOSPITAL_COMMUNITY)
Admission: RE | Disposition: A | Discharge status: Home / Self Care | Payer: Self-pay | Source: Home / Self Care | Attending: Orthopedic Surgery

## 2023-12-16 ENCOUNTER — Inpatient Hospital Stay (HOSPITAL_COMMUNITY): Payer: Self-pay | Admitting: Vascular Surgery

## 2023-12-16 DIAGNOSIS — E119 Type 2 diabetes mellitus without complications: Secondary | ICD-10-CM | POA: Diagnosis not present

## 2023-12-16 DIAGNOSIS — M47815 Spondylosis without myelopathy or radiculopathy, thoracolumbar region: Secondary | ICD-10-CM | POA: Diagnosis not present

## 2023-12-16 DIAGNOSIS — M960 Pseudarthrosis after fusion or arthrodesis: Secondary | ICD-10-CM | POA: Diagnosis not present

## 2023-12-16 DIAGNOSIS — S32009K Unspecified fracture of unspecified lumbar vertebra, subsequent encounter for fracture with nonunion: Principal | ICD-10-CM | POA: Diagnosis present

## 2023-12-16 DIAGNOSIS — M1990 Unspecified osteoarthritis, unspecified site: Secondary | ICD-10-CM | POA: Diagnosis present

## 2023-12-16 DIAGNOSIS — E785 Hyperlipidemia, unspecified: Secondary | ICD-10-CM | POA: Diagnosis present

## 2023-12-16 DIAGNOSIS — F418 Other specified anxiety disorders: Secondary | ICD-10-CM | POA: Diagnosis not present

## 2023-12-16 DIAGNOSIS — M797 Fibromyalgia: Secondary | ICD-10-CM | POA: Diagnosis present

## 2023-12-16 DIAGNOSIS — M4317 Spondylolisthesis, lumbosacral region: Secondary | ICD-10-CM | POA: Diagnosis not present

## 2023-12-16 DIAGNOSIS — Z79899 Other long term (current) drug therapy: Secondary | ICD-10-CM

## 2023-12-16 DIAGNOSIS — Z7982 Long term (current) use of aspirin: Secondary | ICD-10-CM | POA: Diagnosis not present

## 2023-12-16 DIAGNOSIS — F419 Anxiety disorder, unspecified: Secondary | ICD-10-CM | POA: Diagnosis present

## 2023-12-16 DIAGNOSIS — I10 Essential (primary) hypertension: Secondary | ICD-10-CM

## 2023-12-16 DIAGNOSIS — M4319 Spondylolisthesis, multiple sites in spine: Secondary | ICD-10-CM | POA: Diagnosis not present

## 2023-12-16 DIAGNOSIS — I471 Supraventricular tachycardia, unspecified: Secondary | ICD-10-CM | POA: Diagnosis present

## 2023-12-16 DIAGNOSIS — M48061 Spinal stenosis, lumbar region without neurogenic claudication: Secondary | ICD-10-CM | POA: Diagnosis not present

## 2023-12-16 LAB — GLUCOSE, CAPILLARY
Glucose-Capillary: 105 mg/dL — ABNORMAL HIGH (ref 70–99)
Glucose-Capillary: 147 mg/dL — ABNORMAL HIGH (ref 70–99)
Glucose-Capillary: 144 mg/dL — ABNORMAL HIGH (ref 70–99)
Glucose-Capillary: 128 mg/dL — ABNORMAL HIGH (ref 70–99)

## 2023-12-16 SURGERY — POSTERIOR LUMBAR FUSION 1 LEVEL
Anesthesia: General | Site: Spine Lumbar

## 2023-12-16 MED ORDER — DEXAMETHASONE SODIUM PHOSPHATE 10 MG/ML IJ SOLN
INTRAMUSCULAR | Status: AC
  Filled 2023-12-16: qty 1

## 2023-12-16 MED ORDER — CEFAZOLIN SODIUM-DEXTROSE 2-4 GM/100ML-% IV SOLN
2.0000 g | Freq: Three times a day (TID) | INTRAVENOUS | Status: AC
  Administered 2023-12-16 (×2): 2 g via INTRAVENOUS
  Filled 2023-12-16 (×2): qty 100

## 2023-12-16 MED ORDER — ACETAMINOPHEN 325 MG PO TABS: 650.0000 mg | ORAL_TABLET | ORAL | Status: DC | PRN

## 2023-12-16 MED ORDER — 0.9 % SODIUM CHLORIDE (POUR BTL) OPTIME
TOPICAL | Status: DC | PRN
  Administered 2023-12-16: 1000 mL

## 2023-12-16 MED ORDER — SODIUM CHLORIDE 0.9% FLUSH
3.0000 mL | INTRAVENOUS | Status: DC | PRN
Start: 2023-12-16 — End: 2023-12-17

## 2023-12-16 MED ORDER — BUPIVACAINE LIPOSOME 1.3 % IJ SUSP
INTRAMUSCULAR | Status: AC
Start: 2023-12-16 — End: ?
  Filled 2023-12-16: qty 20

## 2023-12-16 MED ORDER — LIDOCAINE 2% (20 MG/ML) 5 ML SYRINGE
INTRAMUSCULAR | Status: AC
  Filled 2023-12-16: qty 5

## 2023-12-16 MED ORDER — METOPROLOL SUCCINATE ER 50 MG PO TB24
50.0000 mg | ORAL_TABLET | Freq: Every day | ORAL | Status: DC
  Administered 2023-12-16: 50 mg via ORAL
  Filled 2023-12-16: qty 1

## 2023-12-16 MED ORDER — KETOROLAC TROMETHAMINE 0.5 % OP SOLN
OPHTHALMIC | Status: AC
  Filled 2023-12-16: qty 5

## 2023-12-16 MED ORDER — THROMBIN 20000 UNITS EX SOLR
CUTANEOUS | Status: AC
  Filled 2023-12-16: qty 20000

## 2023-12-16 MED ORDER — SODIUM CHLORIDE 0.9 % IV SOLN
250.0000 mL | INTRAVENOUS | Status: DC
  Administered 2023-12-16: 250 mL via INTRAVENOUS

## 2023-12-16 MED ORDER — HYPROMELLOSE (GONIOSCOPIC) 2.5 % OP SOLN: 1.0000 [drp] | OPHTHALMIC | Status: DC | PRN

## 2023-12-16 MED ORDER — BSS IO SOLN
15.0000 mL | Freq: Once | INTRAOCULAR | Status: AC
  Administered 2023-12-16: 15 mL

## 2023-12-16 MED ORDER — METHOCARBAMOL 500 MG PO TABS
500.0000 mg | ORAL_TABLET | Freq: Four times a day (QID) | ORAL | Status: DC | PRN
  Administered 2023-12-17: 500 mg via ORAL
  Filled 2023-12-16: qty 1

## 2023-12-16 MED ORDER — OXYCODONE-ACETAMINOPHEN 5-325 MG PO TABS
1.0000 | ORAL_TABLET | ORAL | Status: DC | PRN
  Administered 2023-12-16: 2 via ORAL
  Administered 2023-12-16 – 2023-12-17 (×3): 1 via ORAL
  Filled 2023-12-16 (×2): qty 2
  Filled 2023-12-16 (×2): qty 1

## 2023-12-16 MED ORDER — DIPHENHYDRAMINE HCL 25 MG PO TABS
25.0000 mg | ORAL_TABLET | Freq: Every day | ORAL | Status: DC
  Administered 2023-12-16: 25 mg via ORAL
  Filled 2023-12-16 (×2): qty 1

## 2023-12-16 MED ORDER — PROPOFOL 10 MG/ML IV BOLUS
INTRAVENOUS | Status: AC
Start: 2023-12-16 — End: ?
  Filled 2023-12-16: qty 20

## 2023-12-16 MED ORDER — TRAMADOL HCL 50 MG PO TABS
50.0000 mg | ORAL_TABLET | Freq: Four times a day (QID) | ORAL | Status: DC | PRN
  Administered 2023-12-16: 50 mg via ORAL
  Filled 2023-12-16: qty 1

## 2023-12-16 MED ORDER — ZOLPIDEM TARTRATE 5 MG PO TABS
5.0000 mg | ORAL_TABLET | Freq: Every evening | ORAL | Status: DC | PRN
  Administered 2023-12-16: 5 mg via ORAL
  Filled 2023-12-16: qty 1

## 2023-12-16 MED ORDER — OXYCODONE HCL 5 MG/5ML PO SOLN: 5.0000 mg | Freq: Once | ORAL | Status: DC | PRN

## 2023-12-16 MED ORDER — AMISULPRIDE (ANTIEMETIC) 5 MG/2ML IV SOLN: 10.0000 mg | Freq: Once | INTRAVENOUS | Status: DC | PRN

## 2023-12-16 MED ORDER — ATORVASTATIN CALCIUM 10 MG PO TABS
20.0000 mg | ORAL_TABLET | Freq: Every day | ORAL | Status: DC
  Filled 2023-12-16 (×2): qty 2

## 2023-12-16 MED ORDER — SERTRALINE HCL 100 MG PO TABS
200.0000 mg | ORAL_TABLET | Freq: Every day | ORAL | Status: DC
  Filled 2023-12-16 (×2): qty 2

## 2023-12-16 MED ORDER — PHENYLEPHRINE 80 MCG/ML (10ML) SYRINGE FOR IV PUSH (FOR BLOOD PRESSURE SUPPORT)
PREFILLED_SYRINGE | INTRAVENOUS | Status: DC | PRN
  Administered 2023-12-16 (×4): 80 ug via INTRAVENOUS

## 2023-12-16 MED ORDER — ONDANSETRON HCL 4 MG/2ML IJ SOLN
INTRAMUSCULAR | Status: DC | PRN
  Administered 2023-12-16: 4 mg via INTRAVENOUS

## 2023-12-16 MED ORDER — SODIUM CHLORIDE 0.9% FLUSH
3.0000 mL | Freq: Two times a day (BID) | INTRAVENOUS | Status: DC
  Administered 2023-12-16: 10 mL via INTRAVENOUS
  Administered 2023-12-16: 3 mL via INTRAVENOUS

## 2023-12-16 MED ORDER — MIDAZOLAM HCL 2 MG/2ML IJ SOLN
INTRAMUSCULAR | Status: AC
  Filled 2023-12-16: qty 2

## 2023-12-16 MED ORDER — HYDROMORPHONE HCL 2 MG PO TABS: 1.0000 mg | ORAL_TABLET | ORAL | Status: DC | PRN

## 2023-12-16 MED ORDER — DEXAMETHASONE SODIUM PHOSPHATE 10 MG/ML IJ SOLN
INTRAMUSCULAR | Status: DC | PRN
  Administered 2023-12-16: 5 mg via INTRAVENOUS

## 2023-12-16 MED ORDER — OXYCODONE HCL 5 MG PO TABS: 5.0000 mg | ORAL_TABLET | Freq: Once | ORAL | Status: DC | PRN

## 2023-12-16 MED ORDER — ACETAMINOPHEN 500 MG PO TABS
1000.0000 mg | ORAL_TABLET | Freq: Once | ORAL | Status: AC
  Administered 2023-12-16: 1000 mg via ORAL
  Filled 2023-12-16: qty 2

## 2023-12-16 MED ORDER — STERILE WATER FOR IRRIGATION IR SOLN
Status: DC | PRN
  Administered 2023-12-16: 1000 mL

## 2023-12-16 MED ORDER — PHENYLEPHRINE HCL-NACL 20-0.9 MG/250ML-% IV SOLN
INTRAVENOUS | Status: DC | PRN
  Administered 2023-12-16: 25 ug/min via INTRAVENOUS

## 2023-12-16 MED ORDER — RISAQUAD PO CAPS
1.0000 | ORAL_CAPSULE | Freq: Every day | ORAL | Status: DC
  Administered 2023-12-16: 1 via ORAL
  Filled 2023-12-16 (×2): qty 1

## 2023-12-16 MED ORDER — BUPIVACAINE-EPINEPHRINE (PF) 0.25% -1:200000 IJ SOLN
INTRAMUSCULAR | Status: DC | PRN
  Administered 2023-12-16: 40 mL

## 2023-12-16 MED ORDER — ONDANSETRON HCL 4 MG/2ML IJ SOLN
INTRAMUSCULAR | Status: AC
  Filled 2023-12-16: qty 2

## 2023-12-16 MED ORDER — ROCURONIUM BROMIDE 10 MG/ML (PF) SYRINGE
PREFILLED_SYRINGE | INTRAVENOUS | Status: AC
  Filled 2023-12-16: qty 10

## 2023-12-16 MED ORDER — PHENYLEPHRINE 80 MCG/ML (10ML) SYRINGE FOR IV PUSH (FOR BLOOD PRESSURE SUPPORT)
PREFILLED_SYRINGE | INTRAVENOUS | Status: AC
  Filled 2023-12-16: qty 10

## 2023-12-16 MED ORDER — SODIUM CHLORIDE 0.9% FLUSH
3.0000 mL | Freq: Two times a day (BID) | INTRAVENOUS | Status: DC
  Administered 2023-12-16: 3 mL via INTRAVENOUS

## 2023-12-16 MED ORDER — BSS IO SOLN
INTRAOCULAR | Status: AC
  Filled 2023-12-16: qty 15

## 2023-12-16 MED ORDER — FESOTERODINE FUMARATE ER 4 MG PO TB24
4.0000 mg | ORAL_TABLET | Freq: Every day | ORAL | Status: DC
  Filled 2023-12-16 (×2): qty 1

## 2023-12-16 MED ORDER — SODIUM CHLORIDE (PF) 0.9 % IJ SOLN
INTRAMUSCULAR | Status: AC
  Filled 2023-12-16: qty 10

## 2023-12-16 MED ORDER — HYDROMORPHONE HCL 1 MG/ML IJ SOLN: 0.2500 mg | INTRAMUSCULAR | Status: DC | PRN

## 2023-12-16 MED ORDER — HYDROMORPHONE HCL 1 MG/ML IJ SOLN: 0.5000 mg | INTRAMUSCULAR | Status: DC | PRN

## 2023-12-16 MED ORDER — SUMATRIPTAN SUCCINATE 50 MG PO TABS: 50.0000 mg | ORAL_TABLET | ORAL | Status: DC | PRN

## 2023-12-16 MED ORDER — COLESTIPOL HCL 1 G PO TABS
2.0000 g | ORAL_TABLET | Freq: Two times a day (BID) | ORAL | Status: DC
  Administered 2023-12-16: 2 g via ORAL
  Filled 2023-12-16 (×3): qty 2

## 2023-12-16 MED ORDER — KETOROLAC TROMETHAMINE 0.5 % OP SOLN
1.0000 [drp] | Freq: Four times a day (QID) | OPHTHALMIC | Status: DC
  Administered 2023-12-16: 1 [drp] via OPHTHALMIC

## 2023-12-16 MED ORDER — LIDOCAINE 2% (20 MG/ML) 5 ML SYRINGE
INTRAMUSCULAR | Status: DC | PRN
  Administered 2023-12-16: 100 mg via INTRAVENOUS

## 2023-12-16 MED ORDER — MIDAZOLAM HCL 2 MG/2ML IJ SOLN
INTRAMUSCULAR | Status: DC | PRN
  Administered 2023-12-16: 2 mg via INTRAVENOUS

## 2023-12-16 MED ORDER — MIRABEGRON ER 50 MG PO TB24
50.0000 mg | ORAL_TABLET | Freq: Every day | ORAL | Status: DC
  Filled 2023-12-16 (×2): qty 1

## 2023-12-16 MED ORDER — ADULT MULTIVITAMIN W/MINERALS CH
1.0000 | ORAL_TABLET | Freq: Every day | ORAL | Status: DC
  Administered 2023-12-16: 1 via ORAL
  Filled 2023-12-16 (×2): qty 1

## 2023-12-16 MED ORDER — PHENOL 1.4 % MT LIQD: 1.0000 | OROMUCOSAL | Status: DC | PRN

## 2023-12-16 MED ORDER — FENTANYL CITRATE (PF) 250 MCG/5ML IJ SOLN
INTRAMUSCULAR | Status: AC
  Filled 2023-12-16: qty 5

## 2023-12-16 MED ORDER — DOCUSATE SODIUM 100 MG PO CAPS
100.0000 mg | ORAL_CAPSULE | Freq: Two times a day (BID) | ORAL | Status: DC
  Administered 2023-12-16 (×2): 100 mg via ORAL
  Filled 2023-12-16 (×3): qty 1

## 2023-12-16 MED ORDER — KETAMINE HCL 50 MG/5ML IJ SOSY
PREFILLED_SYRINGE | INTRAMUSCULAR | Status: DC | PRN
  Administered 2023-12-16: 20 mg via INTRAVENOUS
  Administered 2023-12-16 (×3): 10 mg via INTRAVENOUS

## 2023-12-16 MED ORDER — BISACODYL 5 MG PO TBEC: 5.0000 mg | DELAYED_RELEASE_TABLET | Freq: Every day | ORAL | Status: DC | PRN

## 2023-12-16 MED ORDER — POVIDONE-IODINE 7.5 % EX SOLN
Freq: Once | CUTANEOUS | Status: DC
  Filled 2023-12-16: qty 118

## 2023-12-16 MED ORDER — SODIUM CHLORIDE 0.9% FLUSH: 3.0000 mL | INTRAVENOUS | Status: DC | PRN

## 2023-12-16 MED ORDER — ALBUMIN HUMAN 5 % IV SOLN: INTRAVENOUS | Status: DC | PRN

## 2023-12-16 MED ORDER — ONDANSETRON HCL 4 MG/2ML IJ SOLN: 4.0000 mg | Freq: Four times a day (QID) | INTRAMUSCULAR | Status: DC | PRN

## 2023-12-16 MED ORDER — ROCURONIUM BROMIDE 10 MG/ML (PF) SYRINGE
PREFILLED_SYRINGE | INTRAVENOUS | Status: DC | PRN
  Administered 2023-12-16: 10 mg via INTRAVENOUS
  Administered 2023-12-16: 80 mg via INTRAVENOUS
  Administered 2023-12-16 (×2): 10 mg via INTRAVENOUS

## 2023-12-16 MED ORDER — THROMBIN 20000 UNITS EX KIT
PACK | CUTANEOUS | Status: DC | PRN
  Administered 2023-12-16: 20 mL via TOPICAL

## 2023-12-16 MED ORDER — ONDANSETRON HCL 4 MG PO TABS: 4.0000 mg | ORAL_TABLET | Freq: Four times a day (QID) | ORAL | Status: DC | PRN

## 2023-12-16 MED ORDER — KETAMINE HCL 50 MG/5ML IJ SOSY
PREFILLED_SYRINGE | INTRAMUSCULAR | Status: AC
Start: 2023-12-16 — End: ?
  Filled 2023-12-16: qty 5

## 2023-12-16 MED ORDER — SODIUM CHLORIDE 0.9 % IV SOLN: INTRAVENOUS | Status: DC | PRN

## 2023-12-16 MED ORDER — SUGAMMADEX SODIUM 200 MG/2ML IV SOLN
INTRAVENOUS | Status: DC | PRN
  Administered 2023-12-16: 200 mg via INTRAVENOUS

## 2023-12-16 MED ORDER — MENTHOL 3 MG MT LOZG: 1.0000 | LOZENGE | OROMUCOSAL | Status: DC | PRN

## 2023-12-16 MED ORDER — ORAL CARE MOUTH RINSE: 15.0000 mL | Freq: Once | OROMUCOSAL | Status: AC

## 2023-12-16 MED ORDER — FLEET ENEMA RE ENEM: 1.0000 | ENEMA | Freq: Once | RECTAL | Status: DC | PRN

## 2023-12-16 MED ORDER — BUPIVACAINE-EPINEPHRINE (PF) 0.25% -1:200000 IJ SOLN
INTRAMUSCULAR | Status: AC
  Filled 2023-12-16: qty 30

## 2023-12-16 MED ORDER — ACETAMINOPHEN 650 MG RE SUPP: 650.0000 mg | RECTAL | Status: DC | PRN

## 2023-12-16 MED ORDER — SENNOSIDES-DOCUSATE SODIUM 8.6-50 MG PO TABS: 1.0000 | ORAL_TABLET | Freq: Every evening | ORAL | Status: DC | PRN

## 2023-12-16 MED ORDER — CEFAZOLIN SODIUM-DEXTROSE 2-4 GM/100ML-% IV SOLN
2.0000 g | INTRAVENOUS | Status: AC
  Administered 2023-12-16: 2 g via INTRAVENOUS
  Filled 2023-12-16: qty 100

## 2023-12-16 MED ORDER — BUPIVACAINE-EPINEPHRINE 0.25% -1:200000 IJ SOLN
INTRAMUSCULAR | Status: DC | PRN
  Administered 2023-12-16: 10 mL

## 2023-12-16 MED ORDER — LACTATED RINGERS IV SOLN: INTRAVENOUS | Status: DC

## 2023-12-16 MED ORDER — GABAPENTIN 300 MG PO CAPS
300.0000 mg | ORAL_CAPSULE | Freq: Every day | ORAL | Status: DC
  Administered 2023-12-16: 300 mg via ORAL
  Filled 2023-12-16: qty 1

## 2023-12-16 MED ORDER — PROPRANOLOL HCL 10 MG PO TABS: 10.0000 mg | ORAL_TABLET | Freq: Every day | ORAL | Status: DC | PRN

## 2023-12-16 MED ORDER — ALUM & MAG HYDROXIDE-SIMETH 200-200-20 MG/5ML PO SUSP: 30.0000 mL | Freq: Four times a day (QID) | ORAL | Status: DC | PRN

## 2023-12-16 MED ORDER — FENTANYL CITRATE (PF) 250 MCG/5ML IJ SOLN
INTRAMUSCULAR | Status: DC | PRN
  Administered 2023-12-16 (×2): 50 ug via INTRAVENOUS

## 2023-12-16 MED ORDER — EPHEDRINE SULFATE-NACL 50-0.9 MG/10ML-% IV SOSY
PREFILLED_SYRINGE | INTRAVENOUS | Status: DC | PRN
  Administered 2023-12-16: 5 mg via INTRAVENOUS
  Administered 2023-12-16: 2.5 mg via INTRAVENOUS
  Administered 2023-12-16: 5 mg via INTRAVENOUS

## 2023-12-16 MED ORDER — CHLORHEXIDINE GLUCONATE 0.12 % MT SOLN
15.0000 mL | Freq: Once | OROMUCOSAL | Status: AC
  Administered 2023-12-16: 15 mL via OROMUCOSAL
  Filled 2023-12-16: qty 15

## 2023-12-16 MED ORDER — ONDANSETRON HCL 4 MG/2ML IJ SOLN
4.0000 mg | Freq: Once | INTRAMUSCULAR | Status: DC | PRN
Start: 2023-12-16 — End: 2023-12-16

## 2023-12-16 MED ORDER — POLYVINYL ALCOHOL 1.4 % OP SOLN: 2.0000 [drp] | OPHTHALMIC | Status: DC | PRN

## 2023-12-16 MED ORDER — PROPOFOL 10 MG/ML IV BOLUS
INTRAVENOUS | Status: DC | PRN
  Administered 2023-12-16: 150 mg via INTRAVENOUS

## 2023-12-16 SURGICAL SUPPLY — 72 items
BAG COUNTER SPONGE SURGICOUNT (BAG) ×2 IMPLANT
BENZOIN TINCTURE PRP APPL 2/3 (GAUZE/BANDAGES/DRESSINGS) ×2 IMPLANT
BLADE CLIPPER SURG (BLADE) IMPLANT
BUR PRESCISION 1.7 ELITE (BURR) ×2 IMPLANT
BUR ROUND FLUTED 5 RND (BURR) ×2 IMPLANT
BUR ROUND PRECISION 4.0 (BURR) IMPLANT
BUR SABER RD CUTTING 3.0 (BURR) IMPLANT
CNTNR URN SCR LID CUP LEK RST (MISCELLANEOUS) ×2 IMPLANT
COVER MAYO STAND STRL (DRAPES) ×4 IMPLANT
COVER SURGICAL LIGHT HANDLE (MISCELLANEOUS) ×2 IMPLANT
DRAPE C-ARM 42X72 X-RAY (DRAPES) ×2 IMPLANT
DRAPE C-ARMOR (DRAPES) IMPLANT
DRAPE POUCH INSTRU U-SHP 10X18 (DRAPES) ×2 IMPLANT
DRAPE SURG 17X23 STRL (DRAPES) ×8 IMPLANT
DURAPREP 26ML APPLICATOR (WOUND CARE) ×2 IMPLANT
ELECT CAUTERY BLADE 6.4 (BLADE) ×2 IMPLANT
ELECT PENCIL ROCKER SW 15FT (MISCELLANEOUS) IMPLANT
ELECTRODE BLDE 4.0 EZ CLN MEGD (MISCELLANEOUS) ×2 IMPLANT
ELECTRODE REM PT RTRN 9FT ADLT (ELECTROSURGICAL) ×2 IMPLANT
EVACUATOR SILICONE 100CC (DRAIN) IMPLANT
FIBER BONE PLIAFX PAK 5CC (Bone Implant) ×1 IMPLANT
FILTER STRAW FLUID ASPIR (MISCELLANEOUS) ×2 IMPLANT
GAUZE 4X4 16PLY ~~LOC~~+RFID DBL (SPONGE) ×2 IMPLANT
GAUZE SPONGE 4X4 12PLY STRL (GAUZE/BANDAGES/DRESSINGS) ×2 IMPLANT
GLOVE BIO SURGEON STRL SZ 6.5 (GLOVE) ×2 IMPLANT
GLOVE BIO SURGEON STRL SZ8 (GLOVE) ×2 IMPLANT
GLOVE BIOGEL PI IND STRL 7.0 (GLOVE) ×2 IMPLANT
GLOVE BIOGEL PI IND STRL 8 (GLOVE) ×2 IMPLANT
GLOVE SURG ENC MOIS LTX SZ6.5 (GLOVE) ×2 IMPLANT
GOWN STRL REUS W/ TWL LRG LVL3 (GOWN DISPOSABLE) ×4 IMPLANT
GOWN STRL REUS W/ TWL XL LVL3 (GOWN DISPOSABLE) ×2 IMPLANT
GRAFT BNE FBR PLIAFX PAK 5 (Bone Implant) IMPLANT
IV CATH 14GX2 1/4 (CATHETERS) ×2 IMPLANT
KIT BASIN OR (CUSTOM PROCEDURE TRAY) ×2 IMPLANT
KIT INFUSE SMALL (Orthopedic Implant) IMPLANT
KIT POSITIONER JACKSON TABLE (MISCELLANEOUS) ×2 IMPLANT
KIT TURNOVER KIT B (KITS) ×2 IMPLANT
MARKER SKIN DUAL TIP RULER LAB (MISCELLANEOUS) ×4 IMPLANT
NDL 18GX1X1/2 (RX/OR ONLY) (NEEDLE) ×2 IMPLANT
NDL 22X1.5 STRL (OR ONLY) (MISCELLANEOUS) ×4 IMPLANT
NDL HYPO 25GX1X1/2 BEV (NEEDLE) ×2 IMPLANT
NDL SPNL 18GX3.5 QUINCKE PK (NEEDLE) ×4 IMPLANT
NEEDLE 18GX1X1/2 (RX/OR ONLY) (NEEDLE) ×1 IMPLANT
NEEDLE 22X1.5 STRL (OR ONLY) (MISCELLANEOUS) ×2 IMPLANT
NEEDLE HYPO 25GX1X1/2 BEV (NEEDLE) ×1 IMPLANT
NEEDLE SPNL 18GX3.5 QUINCKE PK (NEEDLE) ×2 IMPLANT
NS IRRIG 1000ML POUR BTL (IV SOLUTION) ×2 IMPLANT
PACK LAMINECTOMY ORTHO (CUSTOM PROCEDURE TRAY) ×2 IMPLANT
PACK UNIVERSAL I (CUSTOM PROCEDURE TRAY) ×2 IMPLANT
PAD ARMBOARD POSITIONER FOAM (MISCELLANEOUS) ×4 IMPLANT
PATTIES SURGICAL .5 X1 (DISPOSABLE) ×2 IMPLANT
PATTIES SURGICAL .5X1.5 (GAUZE/BANDAGES/DRESSINGS) ×2 IMPLANT
ROD SPINAL 5.5X35 CP 4 TI (Rod) IMPLANT
SCREW CANN POST SOLERA 8.5X30 (Screw) IMPLANT
SCREW CANN SOLERA 8.5X35 F/5.5 (Screw) IMPLANT
SCREW SET SOLERA TI5.5 (Screw) IMPLANT
SPONGE INTESTINAL PEANUT (DISPOSABLE) ×2 IMPLANT
SPONGE SURGIFOAM ABS GEL 100 (HEMOSTASIS) ×2 IMPLANT
STRIP CLOSURE SKIN 1/2X4 (GAUZE/BANDAGES/DRESSINGS) ×4 IMPLANT
SURGIFLO W/THROMBIN 8M KIT (HEMOSTASIS) IMPLANT
SUT MNCRL AB 4-0 PS2 18 (SUTURE) ×2 IMPLANT
SUT VIC AB 0 CT1 18XCR BRD 8 (SUTURE) ×2 IMPLANT
SUT VIC AB 1 CT1 18XCR BRD 8 (SUTURE) ×2 IMPLANT
SUT VIC AB 2-0 CT2 18 VCP726D (SUTURE) ×2 IMPLANT
SYR 20ML LL LF (SYRINGE) ×4 IMPLANT
SYR BULB IRRIG 60ML STRL (SYRINGE) ×2 IMPLANT
SYR CONTROL 10ML LL (SYRINGE) ×4 IMPLANT
SYR TB 1ML LUER SLIP (SYRINGE) ×2 IMPLANT
TAPE CLOTH SURG 4X10 WHT LF (GAUZE/BANDAGES/DRESSINGS) IMPLANT
TRAY FOLEY MTR SLVR 16FR STAT (SET/KITS/TRAYS/PACK) ×2 IMPLANT
WATER STERILE IRR 1000ML POUR (IV SOLUTION) ×2 IMPLANT
YANKAUER SUCT BULB TIP NO VENT (SUCTIONS) ×2 IMPLANT

## 2023-12-16 NOTE — Transfer of Care (Signed)
 Immediate Anesthesia Transfer of Care Note  Patient: [name]  Procedure(s) Performed: LUMBAR FIVE - SACRUM ONE REVISION POSTERIOR FUSION WITH INSTRUMENTATION AND ALLOGRAFT WITH BONE MORPHOGENIC PROTEIN (Spine Lumbar)  Patient Location: PACU  Anesthesia Type:General  Level of Consciousness: oriented, drowsy, and patient cooperative  Airway & Oxygen Therapy: Patient Spontanous Breathing and Patient connected to nasal cannula oxygen  Post-op Assessment: Report given to RN, Post -op Vital signs reviewed and stable, Patient moving all extremities X 4, and Patient able to stick tongue midline  Post vital signs: Reviewed and stable  Last Vitals:  Vitals Value Taken Time  BP 131/60 12/16/23 1050  Temp 98.1   Pulse 79 12/16/23 1053  Resp 15 12/16/23 1053  SpO2 99 % 12/16/23 1053  Vitals shown include unfiled device data.  Last Pain:  Vitals:   12/16/23 0643  TempSrc:   PainSc: 3          Complications: No notable events documented.

## 2023-12-16 NOTE — H&P (Signed)
 PREOPERATIVE H&P  Chief Complaint: Low back pain  HPI: [name] is a 73 y.o. female who presents with ongoing pain in the low back  CT reveals a nonunion at L5/S1  Patient has failed multiple forms of conservative care and continues to have pain (see office notes for additional details regarding the patient's full course of treatment)  Past Medical History:  Diagnosis Date   Anxiety    Arthritis    Depression    Difficult intubation    used Glidescope   DM (diabetes mellitus) (HCC)    Diet controlled   Dysrhythmia    SVT   Fibromyalgia    Goiter    neg biopsy   Hyperlipidemia    Hypertension    Migraine headache    PONV (postoperative nausea and vomiting)    SVT (supraventricular tachycardia) (HCC)    Thyromegaly    stable, mild with bilateral nodules   Ulcerative colitis (HCC)    Past Surgical History:  Procedure Laterality Date   ABDOMINAL HYSTERECTOMY     ACHILLES TENDON REPAIR Right    x2   APPENDECTOMY     CARDIOVASCULAR STRESS TEST  11/05/2003   EF 68%   CHOLECYSTECTOMY     DOPPLER ECHOCARDIOGRAPHY  02/11/2000   EF 60-65%   FOOT SURGERY     JOINT REPLACEMENT     NOSE SURGERY     TONSILLECTOMY     TOTAL KNEE ARTHROPLASTY     right knee   TRANSFORAMINAL LUMBAR INTERBODY FUSION (TLIF) WITH PEDICLE SCREW FIXATION 1 LEVEL Left 05/05/2023   Procedure: LEFT-SIDED LUMBAR FIVE - SACRUM ONETRANSFORAMINAL LUMBAR INTERBODY FUSION AND DECOMPRESSION WITH INSTRUMENTATION AND ALLOGRAFT ITH REVISION INSTRUMENTATION;  Surgeon: Virl Grimes, MD;  Location: [facility] OR;  Service: Orthopedics;  Laterality: Left;   TRANSFORAMINAL LUMBAR INTERBODY FUSION (TLIF) WITH PEDICLE SCREW FIXATION 2 LEVEL Right 11/06/2020   Procedure: RIGHT-SIDED LUMBAR 3-4, LUMBAR 4-5 TRANSFORAMINAL LUMBAR INTERBODY FUSION WITH INSTRUMENTATION AND ALLOGRAFT;  Surgeon: Virl Grimes, MD;  Location: [facility] OR;  Service: Orthopedics;  Laterality: Right;   Social History   Socioeconomic History    Marital status: Married    Spouse name: Not on file   Number of children: 3   Years of education: Not on file   Highest education level: Not on file  Occupational History   Occupation: Accountant    Employer: [employer] [place] [employer]  Tobacco Use   Smoking status: Never   Smokeless tobacco: Never  Vaping Use   Vaping status: Never Used  Substance and Sexual Activity   Alcohol  use: Not Currently   Drug use: No   Sexual activity: Not on file  Other Topics Concern   Not on file  Social History Narrative   Not on file   Social Drivers of Health   Financial Resource Strain: Not on file  Food Insecurity: Not on file  Transportation Needs: Not on file  Physical Activity: Not on file  Stress: Not on file  Social Connections: Not on file   Family History  Problem Relation Age of Onset   Myocarditis Mother    Aneurysm Father    Breast cancer Paternal Aunt    Allergies  Allergen Reactions   Topamax [Topiramate] Anxiety   Amitriptyline     Just makes me feel sick    Amoxicillin Hives   Biaxin [Clarithromycin] Hives   Codeine Nausea And Vomiting    Feel sick    Crestor [Rosuvastatin Calcium ]  ARM PAIN   Doxycycline Hives   Lexapro [Escitalopram]     Can not remember reaction    Penicillins Hives   Pravachol     ARM PAIN  *Pravastatin    Protonix [Pantoprazole]     Can not remember reaction    Ranitidine Hcl     Can not remember reaction   No longer on the market   Spironolactone     Can not remember reaction - No longer needed    Prior to Admission medications   Medication Sig Start Date End Date Taking? Authorizing Provider  ALPRAZolam  (XANAX ) 0.5 MG tablet Take 0.5 mg by mouth at bedtime as needed for anxiety or sleep.   Yes [provider]  aspirin  EC 81 MG tablet Take 1 tablet (81 mg total) by mouth daily. Swallow whole. 12/15/21  Yes Haydee Lipa, MD  atorvastatin  (LIPITOR) 20 MG tablet Take 20 mg by mouth daily. 08/03/16  Yes [provider]  colestipol  (COLESTID ) 1 g tablet Take 2 g by mouth 2 (two) times daily.   Yes [provider]  diphenhydrAMINE  (BENADRYL ) 25 MG tablet Take 25 mg by mouth at bedtime.   Yes [provider]  gabapentin (NEURONTIN) 300 MG capsule Take 300 mg by mouth at bedtime.   Yes [provider]  hydroxypropyl methylcellulose / hypromellose (ISOPTO TEARS / GONIOVISC) 2.5 % ophthalmic solution Place 1 drop into both eyes as needed for dry eyes.   Yes [provider]  metoprolol  (TOPROL -XL) 100 MG 24 hr tablet Take 50 mg by mouth at bedtime.   Yes [provider]  mirabegron  ER (MYRBETRIQ ) 50 MG TB24 tablet Take 50 mg by mouth daily.   Yes [provider]  Multiple Vitamins-Minerals (MULTIVITAMIN WITH MINERALS) tablet Take 1 tablet by mouth daily.   Yes [provider]  Probiotic, Lactobacillus, CAPS Take 1 capsule by mouth at bedtime. 01/26/17  Yes [provider]  sertraline  (ZOLOFT ) 100 MG tablet Take 200 mg by mouth daily.   Yes [provider]  solifenacin (VESICARE) 5 MG tablet Take 5 mg by mouth at bedtime. 11/18/23  Yes [provider]  traMADol  (ULTRAM ) 50 MG tablet Take 50 mg by mouth at bedtime. May take an additional dose if needed throughout the day 01/25/18  Yes [provider]  zolpidem  (AMBIEN ) 10 MG tablet Take 10 mg by mouth at bedtime.   Yes [provider]  esomeprazole  (NEXIUM ) 20 MG capsule Take 20 mg by mouth daily as needed (heartburn).    [provider]  HYDROmorphone  (DILAUDID ) 2 MG tablet Take 0.5-1 tablets (1-2 mg total) by mouth every 4 (four) hours as needed for severe pain (pain score 7-10). Patient not taking: Reported on 12/03/2023 05/06/23   McKenzie, Kayla J, PA-C  methocarbamol  (ROBAXIN ) 500 MG tablet Take 1-2 tablets (500-1,000 mg total) by mouth every 6 (six) hours as needed for muscle spasms. Patient not taking: Reported on 12/03/2023 05/06/23    McKenzie, Kayla J, PA-C  PERCOCET 5-325 MG tablet Take 1 tablet by mouth every 6 (six) hours as needed for moderate pain (pain score 4-6). 11/05/23   [provider]  propranolol  (INDERAL ) 10 MG tablet Take 10 mg by mouth daily as needed (For SVT episodes). 03/27/11   Nahser, Lela Purple, MD  rizatriptan (MAXALT) 10 MG tablet Take 10 mg by mouth daily as needed for migraine. May repeat in 2 hours if needed. For migraines    [provider]  All other systems have been reviewed and were otherwise negative with the exception of those mentioned in the HPI and as above.  Physical Exam: Vitals:   12/16/23 0608  BP: 137/78  Pulse: 76  Resp: 18  Temp: 98.4 F (36.9 C)  SpO2: 95%    Body mass index is 34.54 kg/m.  General: Alert, no acute distress Cardiovascular: No pedal edema Respiratory: No cyanosis, no use of accessory musculature Skin: No lesions in the area of chief complaint Neurologic: Sensation intact distally Psychiatric: Patient is competent for consent with normal mood and affect Lymphatic: No axillary or cervical lymphadenopathy  Assessment/Plan: LOW BACK PAIN,  NONUNION, L5/S1 Plan for Procedure(s): POSTERIOR LUMBAR FUSION, L5/S1   Murel Arlington, MD 12/16/2023 7:25 AM

## 2023-12-16 NOTE — Anesthesia Postprocedure Evaluation (Signed)
 Anesthesia Post Note  Patient: PHELAN GOERS  Procedure(s) Performed: LUMBAR FIVE - SACRUM ONE REVISION POSTERIOR FUSION WITH INSTRUMENTATION AND ALLOGRAFT WITH BONE MORPHOGENIC PROTEIN (Spine Lumbar)     Patient location during evaluation: PACU Anesthesia Type: General Level of consciousness: awake and alert, oriented and patient cooperative Pain management: pain level controlled Vital Signs Assessment: post-procedure vital signs reviewed and stable Respiratory status: spontaneous breathing, nonlabored ventilation and respiratory function stable Cardiovascular status: blood pressure returned to baseline and stable Postop Assessment: no apparent nausea or vomiting Anesthetic complications: no   No notable events documented.  Last Vitals:  Vitals:   12/16/23 1115 12/16/23 1130  BP: (!) 111/57 125/66  Pulse: 80 82  Resp: 14 17  Temp:    SpO2: 96% 97%    Last Pain:  Vitals:   12/16/23 1052  TempSrc:   PainSc: Asleep                 Jacquelyne Matte

## 2023-12-16 NOTE — Plan of Care (Signed)

## 2023-12-16 NOTE — Anesthesia Procedure Notes (Signed)
 Procedure Name: Intubation Date/Time: 12/16/2023 7:57 AM  Performed by: Trenton Frock, CRNAPre-anesthesia Checklist: Patient identified, Emergency Drugs available, Suction available and Patient being monitored Patient Re-evaluated:Patient Re-evaluated prior to induction Oxygen Delivery Method: Circle system utilized Preoxygenation: Pre-oxygenation with 100% oxygen Induction Type: IV induction Ventilation: Mask ventilation without difficulty Laryngoscope Size: Mac, 3 and Glidescope Grade View: Grade I Tube type: Oral Tube size: 7.0 mm Number of attempts: 1 Airway Equipment and Method: Stylet and Bite block Placement Confirmation: ETT inserted through vocal cords under direct vision, positive ETCO2 and breath sounds checked- equal and bilateral Secured at: 22 cm Tube secured with: Tape Dental Injury: Teeth and Oropharynx as per pre-operative assessment  Comments: Recommend Glidescope

## 2023-12-16 NOTE — Op Note (Signed)
 PATIENT NAME: [name]   MEDICAL RECORD NO.:   [id number]    DATE OF BIRTH: 1950-09-22   DATE OF PROCEDURE: 12/16/2023                               OPERATIVE REPORT     PREOPERATIVE DIAGNOSES: 1.  L5-S1 pseudoarthrosis resulting in low back pain   POSTOPERATIVE DIAGNOSES: 1.  L5-S1 pseudoarthrosis resulting in low back pain   PROCEDURES: 1.  Revision fusion, L5-S1 2.  Removal and reinsertion of bilateral instrumentation L5, S1 3.  Use of morselized allograft (Pliafix) and bone morphogenic protein 4.  Intraoperative use of fluoroscopy   SURGEON:  Virl Grimes, MD.   ASSISTANTGeraline Knapp, PA-C.   ANESTHESIA:  General endotracheal anesthesia.   COMPLICATIONS:  None.   DISPOSITION:  Stable.   ESTIMATED BLOOD LOSS:  50cc   INDICATIONS FOR SURGERY:  Briefly, [name] is a pleasant 73 year old female who did present to me with severe and ongoing pain in the low back.  She is status post an attempted fusion at the L5-S1 level.  A CAT scan did reveal a nonunion at L5-S1.  Given her ongoing pain, we did discuss proceeding with a revision surgery as outlined above.  She did wish to proceed.   OPERATIVE DETAILS:  On 12/16/2023, the patient was brought to surgery and general endotracheal anesthesia was administered.  The patient was placed prone on a well-padded flat Jackson bed with a spinal frame.  Antibiotics were given and a time-out procedure was performed. The back was prepped and draped in the usual fashion.  A midline incision was made overlying the L5-S1 level.  The paraspinal musculature was retracted on the right and left sides, and the previously placed hardware at L5 and S1 was identified bilaterally.  I did use intraoperative fluoroscopy to help localize the hardware.  The caps, interconnecting rods, and screws were removed.  It was readily obvious that the bilateral L5 and S1 screws were loose, consistent with a nonunion.  At this point, I meticulously  subperiosteally exposed the bilateral L5 transverse processes, and sacral ala.  I then used a high-speed bur to decorticate the posterolateral gutters on the right and left sides.  At this point, BMP sponges, and pliafix was packed along the right and left posterolateral gutters and posterior elements.  At this point, I upsized the screws at L5-S1 from 7 mm, to 8.5 mm in diameter.  The same length screws were replaced.  I then placed 35 mm rods bilaterally.  Caps were then placed and a final locking procedure was performed. The wound was copiously irrigated with a total of approximately 3 L prior to placing the bone graft. The wound was  explored for any undue bleeding and there was no substantial bleeding encountered.   The wound was then closed in layers using #1 Vicryl followed by 2-0 Vicryl, followed by 4-0 Monocryl.  Benzoin and Steri-Strips were applied followed by sterile dressing.     Of note, Geraline Knapp was my assistant throughout surgery, and did aid in retraction, suctioning, and closure.       Virl Grimes, MD

## 2023-12-17 LAB — GLUCOSE, CAPILLARY: Glucose-Capillary: 118 mg/dL — ABNORMAL HIGH (ref 70–99)

## 2023-12-17 MED ORDER — OXYCODONE-ACETAMINOPHEN 5-325 MG PO TABS: 1.0000 | ORAL_TABLET | ORAL | 0 refills | Status: AC | PRN

## 2023-12-17 MED FILL — Thrombin For Soln 20000 Unit: CUTANEOUS | Qty: 1 | Status: AC

## 2023-12-17 NOTE — Evaluation (Signed)
 Physical Therapy Evaluation Patient Details Name: [name] MRN: [id number] DOB: August 30, 1950 Today's Date: 12/17/2023  History of Present Illness  Pt is a 73 yo female s/p revision fusion, L5-S1, removal and reinsertion of bilateral instrumentation L5, S1. PHx: anxiety, depression DM, HTN, OA, HTN, migraines, back sx x 2.  Clinical Impression  PT eval complete. Pt required min assist sit to stand, and CGA amb 500' with RW. Reviewed 3/3 back precautions and brace wear schedule. Pt plans to use ramp entry into house. Husband able to provide needed level of assist. All education complete. Pt discharging home today. PT signing off.       If plan is discharge home, recommend the following: A little help with walking and/or transfers;A little help with bathing/dressing/bathroom;Assistance with cooking/housework;Assist for transportation;Help with stairs or ramp for entrance   Can travel by private vehicle        Equipment Recommendations None recommended by PT  Recommendations for Other Services       Functional Status Assessment Patient has had a recent decline in their functional status and demonstrates the ability to make significant improvements in function in a reasonable and predictable amount of time.     Precautions / Restrictions Precautions Precautions: Back Recall of Precautions/Restrictions: Intact Precaution/Restrictions Comments: Reviewed 3/3 back precautions. Handout provided by OT. Required Braces or Orthoses: Spinal Brace Spinal Brace: Thoracolumbosacral orthotic;Applied in sitting position      Mobility  Bed Mobility               General bed mobility comments: up in recliner    Transfers Overall transfer level: Needs assistance Equipment used: Rolling walker (2 wheels) Transfers: Sit to/from Stand Sit to Stand: Min assist           General transfer comment: assist to power up    Ambulation/Gait Ambulation/Gait assistance: Contact guard  assist Gait Distance (Feet): 500 Feet Assistive device: Rolling walker (2 wheels) Gait Pattern/deviations: Step-through pattern, Decreased stride length Gait velocity: decreased Gait velocity interpretation: 1.31 - 2.62 ft/sec, indicative of limited community ambulator   General Gait Details: steady gait with RW  Stairs            Wheelchair Mobility     Tilt Bed    Modified Rankin (Stroke Patients Only)       Balance Overall balance assessment: Needs assistance Sitting-balance support: No upper extremity supported, Feet supported Sitting balance-Leahy Scale: Good     Standing balance support: Single extremity supported, Bilateral upper extremity supported, During functional activity, Reliant on assistive device for balance Standing balance-Leahy Scale: Poor                               Pertinent Vitals/Pain Pain Assessment Pain Assessment: Faces Faces Pain Scale: Hurts little more Pain Location: back Pain Descriptors / Indicators: Grimacing, Operative site guarding, Discomfort Pain Intervention(s): Monitored during session    Home Living Family/patient expects to be discharged to:: Private residence Living Arrangements: Spouse/significant other Available Help at Discharge: Family;Available 24 hours/day Type of Home: House Home Access: Stairs to enter;Ramped entrance   Entrance Stairs-Number of Steps: 2   Home Layout: One level Home Equipment: Agricultural consultant (2 wheels);Cane - single point;BSC/3in1;Shower seat;Grab bars - tub/shower;Wheelchair - manual;Hospital bed;Adaptive equipment;Rollator (4 wheels)      Prior Function Prior Level of Function : Independent/Modified Independent;Driving             Mobility Comments: rollator at baseline.  Has been utilizing ramped entry       Extremity/Trunk Assessment   Upper Extremity Assessment Upper Extremity Assessment: Defer to OT evaluation    Lower Extremity Assessment Lower Extremity  Assessment: LLE deficits/detail;Generalized weakness LLE Deficits / Details: chronic foot drop    Cervical / Trunk Assessment Cervical / Trunk Assessment: Back Surgery  Communication   Communication Communication: No apparent difficulties    Cognition Arousal: Alert Behavior During Therapy: WFL for tasks assessed/performed   PT - Cognitive impairments: No apparent impairments                         Following commands: Intact       Cueing Cueing Techniques: Verbal cues     General Comments      Exercises     Assessment/Plan    PT Assessment Patient does not need any further PT services  PT Problem List         PT Treatment Interventions      PT Goals (Current goals can be found in the Care Plan section)  Acute Rehab PT Goals Patient Stated Goal: home PT Goal Formulation: All assessment and education complete, DC therapy    Frequency       Co-evaluation               AM-PAC PT "6 Clicks" Mobility  Outcome Measure Help needed turning from your back to your side while in a flat bed without using bedrails?: A Little Help needed moving from lying on your back to sitting on the side of a flat bed without using bedrails?: A Little Help needed moving to and from a bed to a chair (including a wheelchair)?: A Little Help needed standing up from a chair using your arms (e.g., wheelchair or bedside chair)?: A Little Help needed to walk in hospital room?: A Little Help needed climbing 3-5 steps with a railing? : A Little 6 Click Score: 18    End of Session Equipment Utilized During Treatment: Gait belt;Back brace Activity Tolerance: Patient tolerated treatment well Patient left: in chair;with call bell/phone within reach;with family/visitor present Nurse Communication: Mobility status PT Visit Diagnosis: Muscle weakness (generalized) (M62.81);Difficulty in walking, not elsewhere classified (R26.2);Pain    Time: 1191-4782 PT Time Calculation (min)  (ACUTE ONLY): 20 min   Charges:   PT Evaluation $PT Eval Moderate Complexity: 1 Mod   PT General Charges $$ ACUTE PT VISIT: 1 Visit         Dorothye Gathers., PT  Office # [phone]   Guadelupe Leech 12/17/2023, 10:07 AM

## 2023-12-17 NOTE — Evaluation (Signed)
 Occupational Therapy Evaluation and Discharge Patient Details Name: [name] MRN: [id number] DOB: [birth year] Today's Date: 12/17/2023   History of Present Illness   Pt is a 73 yo female s/p revision fusion, L5-S1, removal and reinsertion of bilateral instrumentation L5, S1. PHx: anxiety, depression DM, HTN, OA, HTN, migraines, back sx x 2.     Clinical Impressions This 73 yo female admitted and underwent above presents to acute OT with all education completed and post op back handout provided and reviewed. No further OT needs, we will sign off.     If plan is discharge home, recommend the following:   Assist for transportation;Assistance with cooking/housework     Functional Status Assessment   Patient has had a recent decline in their functional status and demonstrates the ability to make significant improvements in function in a reasonable and predictable amount of time. (without further need for skilled OT)     Equipment Recommendations   None recommended by OT      Precautions/Restrictions   Precautions Precautions: Back Precaution Booklet Issued: Yes (comment) Recall of Precautions/Restrictions: Intact Required Braces or Orthoses: Spinal Brace Spinal Brace: Thoracolumbosacral orthotic;Applied in sitting position Restrictions Weight Bearing Restrictions Per Provider Order: No     Mobility Bed Mobility Overal bed mobility: Modified Independent             General bed mobility comments: pt has bed trapeze and bed rail at home; HOB up (30 degrees) and use of rail    Transfers Overall transfer level: Needs assistance Equipment used: Rolling walker (2 wheels) Transfers: Sit to/from Stand Sit to Stand: Supervision, From elevated surface                  Balance Overall balance assessment: Needs assistance Sitting-balance support: No upper extremity supported, Feet supported Sitting balance-Leahy Scale: Good     Standing balance  support: No upper extremity supported, During functional activity Standing balance-Leahy Scale: Fair                             ADL either performed or assessed with clinical judgement   ADL                                         General ADL Comments: Educated pt on use of 2 cups for brushing teeth, use of wet wipes for back peri care, building up sitting tolerance, positioning in bed with adjustable bed or pillows, sit<>stand stance to keep back more straight, use fo AE for LBD, sequence of dressing     Vision Patient Visual Report: No change from baseline              Pertinent Vitals/Pain Pain Assessment Pain Assessment: 0-10 Pain Score: 3  Pain Location: back Pain Descriptors / Indicators: Operative site guarding, Discomfort, Guarding Pain Intervention(s): Limited activity within patient's tolerance, Monitored during session, Repositioned     Extremity/Trunk Assessment Upper Extremity Assessment Upper Extremity Assessment: Overall WFL for tasks assessed      Communication Communication Communication: No apparent difficulties   Cognition Arousal: Alert Behavior During Therapy: WFL for tasks assessed/performed                                 Following commands: Intact  Cueing   Cueing Techniques: Verbal cues              Home Living Family/patient expects to be discharged to:: Private residence Living Arrangements: Spouse/significant other Available Help at Discharge: Family;Available 24 hours/day Type of Home: House Home Access: Stairs to enter [employer] of Steps: 2   Home Layout: One level     Bathroom Shower/Tub: Producer, television/film/video: Standard     Home Equipment: Agricultural consultant (2 wheels);Cane - single point;BSC/3in1;Shower seat;Grab bars - tub/shower;Wheelchair - manual;Hospital bed;Adaptive equipment;Rollator (4 wheels);Shower seat - built in [employer] Equipment:  Reacher;Sock aid        Prior Functioning/Environment Prior Level of Function : Independent/Modified Independent;Driving             Mobility Comments: rollator at baseline. Has been utilizing ramped entry      OT Problem List: Decreased range of motion;Impaired balance (sitting and/or standing);Pain        OT Goals(Current goals can be found in the care plan section)   Acute Rehab OT Goals Patient Stated Goal: to go home today         AM-PAC OT "6 Clicks" Daily Activity     Outcome Measure Help from another person eating meals?: None Help from another person taking care of personal grooming?: None Help from another person toileting, which includes using toliet, bedpan, or urinal?: None Help from another person bathing (including washing, rinsing, drying)?: None Help from another person to put on and taking off regular upper body clothing?: None Help from another person to put on and taking off regular lower body clothing?: None 6 Click Score: 24   End of Session Equipment Utilized During Treatment: Rolling walker (2 wheels);Back brace Nurse Communication:  (no further OT needs)  Activity Tolerance: Patient tolerated treatment well Patient left: in chair;with call bell/phone within reach  OT Visit Diagnosis: Other abnormalities of gait and mobility (R26.89);Pain Pain - part of body:  (incisional)                Time: 1610-9604 OT Time Calculation (min): 34 min Charges:  OT General Charges $OT Visit: 1 Visit OT Evaluation $OT Eval Moderate Complexity: 1 Mod OT Treatments $Self Care/Home Management : 8-22 mins  Merryl Abraham OT Acute Rehabilitation Services Office [phone]    Lenox Raider 12/17/2023, 10:22 AM

## 2023-12-17 NOTE — Plan of Care (Signed)
 Pt doing well. Pt and family given D/C instructions with verbal understanding. Rx's were sent to the pharmacy by MD. Pt's incision is clean and dry with no sign of infection. Pt's IV was removed prior to D/C. Pt D/C'd home via wheelchair per MD order. Pt is stable @ D/C and has no other needs at this time. Rema Fendt, RN

## 2023-12-17 NOTE — Progress Notes (Signed)
    Patient doing well  Has been ambulating    Physical Exam: Vitals:   12/17/23 0412 12/17/23 0745  BP: 113/66 (!) 104/58  Pulse: 76 69  Resp: 18 20  Temp: 97.9 F (36.6 C) 98.4 F (36.9 C)  SpO2: 98% 96%    Dressing in place NVI  POD #1 s/p revision frusion at L5/S1, doing well  - up with PT/OT, encourage ambulation - Percocet for pain, Robaxin  for muscle spasms - d/c home today with f/u in 2 weeks

## 2024-01-04 NOTE — Discharge Summary (Signed)
 Patient ID: [name] MRN: [id number] DOB/AGE: 71-Jun-1952 71 y.o.  Admit date: 12/16/2023 Discharge date: 12/17/2023  Admission Diagnoses:  Principal Problem:   Pseudoarthrosis of lumbar spine   Discharge Diagnoses:  Same  Past Medical History:  Diagnosis Date   Anxiety    Arthritis    Depression    Difficult intubation    used Glidescope   DM (diabetes mellitus) (HCC)    Diet controlled   Dysrhythmia    SVT   Fibromyalgia    Goiter    neg biopsy   Hyperlipidemia    Hypertension    Migraine headache    PONV (postoperative nausea and vomiting)    SVT (supraventricular tachycardia) (HCC)    Thyromegaly    stable, mild with bilateral nodules   Ulcerative colitis (HCC)     Surgeries: Procedure(s): LUMBAR FIVE - SACRUM ONE REVISION POSTERIOR FUSION WITH INSTRUMENTATION AND ALLOGRAFT WITH BONE MORPHOGENIC PROTEIN on 12/16/2023   Consultants: None  Discharged Condition: Improved  Hospital Course: [name] is an 73 y.o. female who was admitted 12/16/2023 for operative treatment of Pseudoarthrosis of lumbar spine. Patient has severe unremitting pain that affects sleep, daily activities, and work/hobbies. After pre-op clearance the patient was taken to the operating room on 12/16/2023 and underwent  Procedure(s): LUMBAR FIVE - SACRUM ONE REVISION POSTERIOR FUSION WITH INSTRUMENTATION AND ALLOGRAFT WITH BONE MORPHOGENIC PROTEIN.    Patient was given perioperative antibiotics:  Anti-infectives (From admission, onward)    Start     Dose/Rate Route Frequency Ordered Stop   12/16/23 1400  ceFAZolin  (ANCEF ) IVPB 2g/100 mL premix        2 g 200 mL/hr over 30 Minutes Intravenous Every 8 hours 12/16/23 1153 12/16/23 2230   12/16/23 0600  ceFAZolin  (ANCEF ) IVPB 2g/100 mL premix        2 g 200 mL/hr over 30 Minutes Intravenous On call to O.R. 12/16/23 0557 12/16/23 0800        Patient was given sequential compression devices, early ambulation to prevent  DVT.  Patient benefited maximally from hospital stay and there were no complications.    Recent vital signs: BP (!) 104/58 (BP Location: Right Arm)   Pulse 69   Temp 98.4 F (36.9 C) (Oral)   Resp 20   Ht 5' 6 (1.676 m)   Wt 97.1 kg   SpO2 96%   BMI 34.54 kg/m    Discharge Medications:   Allergies as of 12/17/2023       Reactions   Topamax [topiramate] Anxiety   Amitriptyline    Just makes me feel sick    Amoxicillin Hives   Biaxin [clarithromycin] Hives   Codeine Nausea And Vomiting   Feel sick    Crestor [rosuvastatin Calcium ]    ARM PAIN   Doxycycline Hives   Lexapro [escitalopram]    Can not remember reaction    Penicillins Hives   Pravachol    ARM PAIN *Pravastatin    Protonix [pantoprazole]    Can not remember reaction    Ranitidine Hcl    Can not remember reaction  No longer on the market   Spironolactone    Can not remember reaction - No longer needed         Medication List     STOP taking these medications    HYDROmorphone  2 MG tablet Commonly known as: DILAUDID        TAKE these medications    ALPRAZolam  0.5 MG tablet Commonly known as:  XANAX  Take 0.5 mg by mouth at bedtime as needed for anxiety or sleep.   aspirin  EC 81 MG tablet Take 1 tablet (81 mg total) by mouth daily. Swallow whole.   atorvastatin  20 MG tablet Commonly known as: LIPITOR Take 20 mg by mouth daily.   colestipol  1 g tablet Commonly known as: COLESTID  Take 2 g by mouth 2 (two) times daily.   diphenhydrAMINE  25 MG tablet Commonly known as: BENADRYL  Take 25 mg by mouth at bedtime.   esomeprazole  20 MG capsule Commonly known as: NEXIUM  Take 20 mg by mouth daily as needed (heartburn).   gabapentin  300 MG capsule Commonly known as: NEURONTIN  Take 300 mg by mouth at bedtime.   hydroxypropyl methylcellulose / hypromellose 2.5 % ophthalmic solution Commonly known as: ISOPTO TEARS / GONIOVISC Place 1 drop into both eyes as needed for dry eyes.   methocarbamol   500 MG tablet Commonly known as: ROBAXIN  Take 1-2 tablets (500-1,000 mg total) by mouth every 6 (six) hours as needed for muscle spasms.   metoprolol  succinate 100 MG 24 hr tablet Commonly known as: TOPROL -XL Take 50 mg by mouth at bedtime.   multivitamin with minerals tablet Take 1 tablet by mouth daily.   Myrbetriq  50 MG Tb24 tablet Generic drug: mirabegron  ER Take 50 mg by mouth daily.   Percocet 5-325 MG tablet Generic drug: oxyCODONE -acetaminophen  Take 1 tablet by mouth every 6 (six) hours as needed for moderate pain (pain score 4-6). What changed: Another medication with the same name was added. Make sure you understand how and when to take each.   oxyCODONE -acetaminophen  5-325 MG tablet Commonly known as: PERCOCET/ROXICET Take 1-2 tablets by mouth every 4 (four) hours as needed for severe pain (pain score 7-10). What changed: You were already taking a medication with the same name, and this prescription was added. Make sure you understand how and when to take each.   Probiotic (Lactobacillus) Caps Take 1 capsule by mouth at bedtime.   propranolol  10 MG tablet Commonly known as: INDERAL  Take 10 mg by mouth daily as needed (For SVT episodes).   rizatriptan 10 MG tablet Commonly known as: MAXALT Take 10 mg by mouth daily as needed for migraine. May repeat in 2 hours if needed. For migraines   sertraline  100 MG tablet Commonly known as: ZOLOFT  Take 200 mg by mouth daily.   solifenacin 5 MG tablet Commonly known as: VESICARE Take 5 mg by mouth at bedtime.   traMADol  50 MG tablet Commonly known as: ULTRAM  Take 50 mg by mouth at bedtime. May take an additional dose if needed throughout the day   zolpidem  10 MG tablet Commonly known as: AMBIEN  Take 10 mg by mouth at bedtime.        Diagnostic Studies: DG Lumbar Spine 2-3 Views Result Date: 12/16/2023 CLINICAL DATA:  Intraoperative C-arm images of the lumbar spine. EXAM: LUMBAR SPINE - 2-3 VIEW COMPARISON:   Intraoperative lumbar spine radiograph obtained earlier on the same date and CT myelogram dated 10/29/2023. FINDINGS: AP and lateral C-arm images of the lumbar spine demonstrate interbody and pedicle screw and rod fixation at the L5-S1 level with stable grade 1 anterolisthesis. An interbody bone plug with markers is again demonstrated at the L3-4 and L4-5 levels. IMPRESSION: Intraoperative C-arm images of the lumbar spine. Electronically Signed   By: Elspeth Bathe M.D.   On: 12/16/2023 11:36   DG Lumbar Spine 1 View Result Date: 12/16/2023 CLINICAL DATA:  Lumbar spine surgery, intraoperative images. EXAM: LUMBAR SPINE -  1 VIEW COMPARISON:  CT myelogram dated 10/29/2023 FINDINGS: A single portable lateral image of the lumbar spine demonstrates 2 metallic localizers posterior to the spinous processes, 1 at the mid to upper L4 level in the other at the S1-2 level. Stable interbody and pedicle screw and rod fusion at the L5-S1 level with previously demonstrated grade 1 anterolisthesis. Stable interbody bone plugs at the L3 C4-5 and L4-5 levels with moderate to marked disc space narrowing. Stable additional degenerative changes in the upper lumbar spine. IMPRESSION: Intraoperative images for spine surgery. Electronically Signed   By: Elspeth Bathe M.D.   On: 12/16/2023 11:33   DG C-Arm 1-60 Min-No Report Result Date: 12/16/2023 Fluoroscopy was utilized by the requesting physician.  No radiographic interpretation.   DG C-Arm 1-60 Min-No Report Result Date: 12/16/2023 Fluoroscopy was utilized by the requesting physician.  No radiographic interpretation.   DG C-Arm 1-60 Min-No Report Result Date: 12/16/2023 Fluoroscopy was utilized by the requesting physician.  No radiographic interpretation.    Disposition: Discharge disposition: 01-Home or Self Care        POD #1 s/p revision frusion at L5/S1, doing well   - up with PT/OT, encourage ambulation - Percocet for pain, Robaxin  for muscle spasms -Scripts  for pain sent to pharmacy electronically  -D/C instructions sheet printed and in chart -D/C today  -F/U in office 2 weeks   Signed: Ileana PARAS Leilynn Pilat 01/04/2024, 12:12 PM

## 2024-01-06 DIAGNOSIS — N3000 Acute cystitis without hematuria: Secondary | ICD-10-CM | POA: Diagnosis not present

## 2024-01-06 DIAGNOSIS — N3941 Urge incontinence: Secondary | ICD-10-CM | POA: Diagnosis not present

## 2024-01-06 DIAGNOSIS — R8271 Bacteriuria: Secondary | ICD-10-CM | POA: Diagnosis not present

## 2024-01-10 DIAGNOSIS — E119 Type 2 diabetes mellitus without complications: Secondary | ICD-10-CM | POA: Diagnosis not present

## 2024-01-10 DIAGNOSIS — I10 Essential (primary) hypertension: Secondary | ICD-10-CM | POA: Diagnosis not present

## 2024-01-10 DIAGNOSIS — E7800 Pure hypercholesterolemia, unspecified: Secondary | ICD-10-CM | POA: Diagnosis not present

## 2024-01-19 IMAGING — DX DG CHEST 1V PORT
1 series · 1 of 1 positions shown · non-contrast
Comparison: 03/10/2016

CLINICAL DATA: Chest pain

EXAM:
PORTABLE CHEST 1 VIEW

[chest ap]
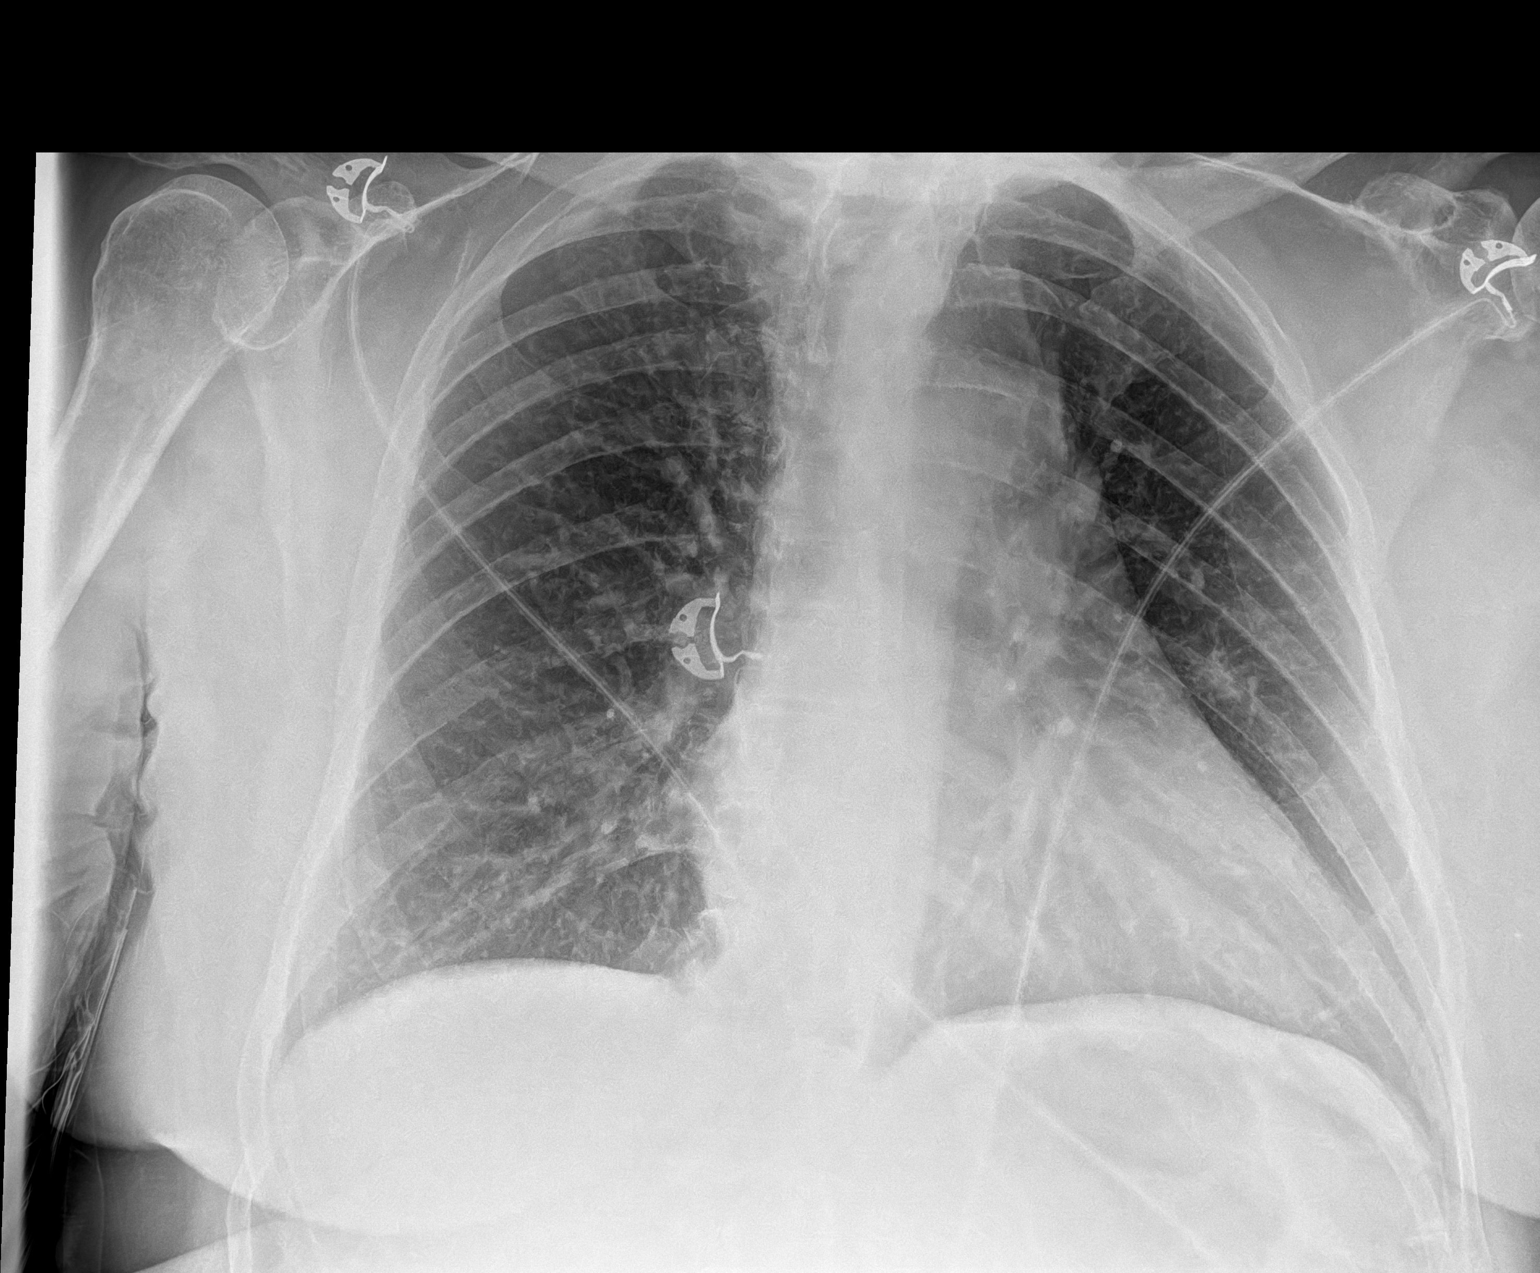

[1 of 1 positions shown; findings below may reference images not displayed]

FINDINGS: The heart size and mediastinal contours are within normal limits. No
focal airspace consolidation, pleural effusion, or pneumothorax. The
visualized skeletal structures are unremarkable.
IMPRESSION: No active disease.

## 2024-01-24 DIAGNOSIS — M545 Low back pain, unspecified: Secondary | ICD-10-CM | POA: Diagnosis not present

## 2024-02-08 ENCOUNTER — Other Ambulatory Visit (HOSPITAL_COMMUNITY): Payer: Self-pay

## 2024-02-10 DIAGNOSIS — I10 Essential (primary) hypertension: Secondary | ICD-10-CM | POA: Diagnosis not present

## 2024-02-10 DIAGNOSIS — E7800 Pure hypercholesterolemia, unspecified: Secondary | ICD-10-CM | POA: Diagnosis not present

## 2024-02-10 DIAGNOSIS — E119 Type 2 diabetes mellitus without complications: Secondary | ICD-10-CM | POA: Diagnosis not present

## 2024-03-06 DIAGNOSIS — M545 Low back pain, unspecified: Secondary | ICD-10-CM | POA: Diagnosis not present

## 2024-03-08 DIAGNOSIS — Z808 Family history of malignant neoplasm of other organs or systems: Secondary | ICD-10-CM | POA: Diagnosis not present

## 2024-03-08 DIAGNOSIS — E042 Nontoxic multinodular goiter: Secondary | ICD-10-CM | POA: Diagnosis not present

## 2024-03-08 DIAGNOSIS — E063 Autoimmune thyroiditis: Secondary | ICD-10-CM | POA: Diagnosis not present

## 2024-03-08 DIAGNOSIS — E119 Type 2 diabetes mellitus without complications: Secondary | ICD-10-CM | POA: Diagnosis not present

## 2024-03-08 DIAGNOSIS — E669 Obesity, unspecified: Secondary | ICD-10-CM | POA: Diagnosis not present

## 2024-03-15 DIAGNOSIS — Z01419 Encounter for gynecological examination (general) (routine) without abnormal findings: Secondary | ICD-10-CM | POA: Diagnosis not present

## 2024-03-16 DIAGNOSIS — R29898 Other symptoms and signs involving the musculoskeletal system: Secondary | ICD-10-CM | POA: Diagnosis not present

## 2024-03-20 DIAGNOSIS — Z8679 Personal history of other diseases of the circulatory system: Secondary | ICD-10-CM | POA: Diagnosis not present

## 2024-03-20 DIAGNOSIS — R1310 Dysphagia, unspecified: Secondary | ICD-10-CM | POA: Diagnosis not present

## 2024-03-20 DIAGNOSIS — Z86018 Personal history of other benign neoplasm: Secondary | ICD-10-CM | POA: Diagnosis not present

## 2024-03-23 DIAGNOSIS — R29898 Other symptoms and signs involving the musculoskeletal system: Secondary | ICD-10-CM | POA: Diagnosis not present

## 2024-03-28 DIAGNOSIS — R29898 Other symptoms and signs involving the musculoskeletal system: Secondary | ICD-10-CM | POA: Diagnosis not present

## 2024-03-30 DIAGNOSIS — N3946 Mixed incontinence: Secondary | ICD-10-CM | POA: Diagnosis not present

## 2024-03-30 DIAGNOSIS — N3000 Acute cystitis without hematuria: Secondary | ICD-10-CM | POA: Diagnosis not present

## 2024-04-04 DIAGNOSIS — R29898 Other symptoms and signs involving the musculoskeletal system: Secondary | ICD-10-CM | POA: Diagnosis not present

## 2024-04-06 DIAGNOSIS — R29898 Other symptoms and signs involving the musculoskeletal system: Secondary | ICD-10-CM | POA: Diagnosis not present

## 2024-04-11 DIAGNOSIS — F419 Anxiety disorder, unspecified: Secondary | ICD-10-CM | POA: Diagnosis not present

## 2024-04-11 DIAGNOSIS — Z23 Encounter for immunization: Secondary | ICD-10-CM | POA: Diagnosis not present

## 2024-04-11 DIAGNOSIS — R296 Repeated falls: Secondary | ICD-10-CM | POA: Diagnosis not present

## 2024-04-11 DIAGNOSIS — E063 Autoimmune thyroiditis: Secondary | ICD-10-CM | POA: Diagnosis not present

## 2024-04-11 DIAGNOSIS — F5101 Primary insomnia: Secondary | ICD-10-CM | POA: Diagnosis not present

## 2024-04-11 DIAGNOSIS — K580 Irritable bowel syndrome with diarrhea: Secondary | ICD-10-CM | POA: Diagnosis not present

## 2024-04-11 DIAGNOSIS — I10 Essential (primary) hypertension: Secondary | ICD-10-CM | POA: Diagnosis not present

## 2024-04-11 DIAGNOSIS — G43109 Migraine with aura, not intractable, without status migrainosus: Secondary | ICD-10-CM | POA: Diagnosis not present

## 2024-04-11 DIAGNOSIS — M21372 Foot drop, left foot: Secondary | ICD-10-CM | POA: Diagnosis not present

## 2024-04-11 DIAGNOSIS — E042 Nontoxic multinodular goiter: Secondary | ICD-10-CM | POA: Diagnosis not present

## 2024-04-11 DIAGNOSIS — E119 Type 2 diabetes mellitus without complications: Secondary | ICD-10-CM | POA: Diagnosis not present

## 2024-04-11 DIAGNOSIS — Z0000 Encounter for general adult medical examination without abnormal findings: Secondary | ICD-10-CM | POA: Diagnosis not present

## 2024-04-11 DIAGNOSIS — E7800 Pure hypercholesterolemia, unspecified: Secondary | ICD-10-CM | POA: Diagnosis not present

## 2024-04-11 DIAGNOSIS — G250 Essential tremor: Secondary | ICD-10-CM | POA: Diagnosis not present

## 2024-04-12 DIAGNOSIS — R29898 Other symptoms and signs involving the musculoskeletal system: Secondary | ICD-10-CM | POA: Diagnosis not present

## 2024-04-13 ENCOUNTER — Other Ambulatory Visit: Payer: Self-pay | Admitting: Family Medicine

## 2024-04-13 DIAGNOSIS — R296 Repeated falls: Secondary | ICD-10-CM

## 2024-04-14 DIAGNOSIS — R29898 Other symptoms and signs involving the musculoskeletal system: Secondary | ICD-10-CM | POA: Diagnosis not present

## 2024-04-15 DIAGNOSIS — I10 Essential (primary) hypertension: Secondary | ICD-10-CM | POA: Diagnosis not present

## 2024-04-15 DIAGNOSIS — E119 Type 2 diabetes mellitus without complications: Secondary | ICD-10-CM | POA: Diagnosis not present

## 2024-04-18 DIAGNOSIS — R29898 Other symptoms and signs involving the musculoskeletal system: Secondary | ICD-10-CM | POA: Diagnosis not present

## 2024-04-20 DIAGNOSIS — R29898 Other symptoms and signs involving the musculoskeletal system: Secondary | ICD-10-CM | POA: Diagnosis not present

## 2024-04-24 DIAGNOSIS — M7061 Trochanteric bursitis, right hip: Secondary | ICD-10-CM | POA: Diagnosis not present

## 2024-04-27 ENCOUNTER — Ambulatory Visit
Admission: RE | Admit: 2024-04-27 | Discharge: 2024-04-27 | Disposition: A | Discharge status: Home / Self Care | Source: Ambulatory Visit | Attending: Family Medicine | Admitting: Family Medicine

## 2024-04-27 DIAGNOSIS — R296 Repeated falls: Secondary | ICD-10-CM

## 2024-04-27 MED ORDER — GADOPICLENOL 0.5 MMOL/ML IV SOLN
10.0000 mL | Freq: Once | INTRAVENOUS | Status: AC | PRN
  Administered 2024-04-27: 10 mL via INTRAVENOUS

## 2024-05-01 DIAGNOSIS — Z09 Encounter for follow-up examination after completed treatment for conditions other than malignant neoplasm: Secondary | ICD-10-CM | POA: Diagnosis not present

## 2024-05-01 DIAGNOSIS — K2289 Other specified disease of esophagus: Secondary | ICD-10-CM | POA: Diagnosis not present

## 2024-05-01 DIAGNOSIS — D123 Benign neoplasm of transverse colon: Secondary | ICD-10-CM | POA: Diagnosis not present

## 2024-05-01 DIAGNOSIS — D122 Benign neoplasm of ascending colon: Secondary | ICD-10-CM | POA: Diagnosis not present

## 2024-05-01 DIAGNOSIS — K648 Other hemorrhoids: Secondary | ICD-10-CM | POA: Diagnosis not present

## 2024-05-01 DIAGNOSIS — R1310 Dysphagia, unspecified: Secondary | ICD-10-CM | POA: Diagnosis not present

## 2024-05-01 DIAGNOSIS — K640 First degree hemorrhoids: Secondary | ICD-10-CM | POA: Diagnosis not present

## 2024-05-01 DIAGNOSIS — K2930 Chronic superficial gastritis without bleeding: Secondary | ICD-10-CM | POA: Diagnosis not present

## 2024-05-01 DIAGNOSIS — Z860101 Personal history of adenomatous and serrated colon polyps: Secondary | ICD-10-CM | POA: Diagnosis not present

## 2024-05-01 DIAGNOSIS — K3189 Other diseases of stomach and duodenum: Secondary | ICD-10-CM | POA: Diagnosis not present

## 2024-05-03 DIAGNOSIS — H04123 Dry eye syndrome of bilateral lacrimal glands: Secondary | ICD-10-CM | POA: Diagnosis not present

## 2024-05-03 DIAGNOSIS — K2930 Chronic superficial gastritis without bleeding: Secondary | ICD-10-CM | POA: Diagnosis not present

## 2024-05-03 DIAGNOSIS — H2513 Age-related nuclear cataract, bilateral: Secondary | ICD-10-CM | POA: Diagnosis not present

## 2024-05-03 DIAGNOSIS — D123 Benign neoplasm of transverse colon: Secondary | ICD-10-CM | POA: Diagnosis not present

## 2024-05-03 DIAGNOSIS — H524 Presbyopia: Secondary | ICD-10-CM | POA: Diagnosis not present

## 2024-05-03 DIAGNOSIS — E119 Type 2 diabetes mellitus without complications: Secondary | ICD-10-CM | POA: Diagnosis not present

## 2024-05-03 DIAGNOSIS — H5213 Myopia, bilateral: Secondary | ICD-10-CM | POA: Diagnosis not present

## 2024-05-03 DIAGNOSIS — H25043 Posterior subcapsular polar age-related cataract, bilateral: Secondary | ICD-10-CM | POA: Diagnosis not present

## 2024-05-03 DIAGNOSIS — K2289 Other specified disease of esophagus: Secondary | ICD-10-CM | POA: Diagnosis not present

## 2024-05-03 DIAGNOSIS — H43813 Vitreous degeneration, bilateral: Secondary | ICD-10-CM | POA: Diagnosis not present

## 2024-05-03 DIAGNOSIS — D122 Benign neoplasm of ascending colon: Secondary | ICD-10-CM | POA: Diagnosis not present

## 2024-05-12 DIAGNOSIS — E119 Type 2 diabetes mellitus without complications: Secondary | ICD-10-CM | POA: Diagnosis not present

## 2024-05-12 DIAGNOSIS — I10 Essential (primary) hypertension: Secondary | ICD-10-CM | POA: Diagnosis not present

## 2024-05-12 DIAGNOSIS — E7800 Pure hypercholesterolemia, unspecified: Secondary | ICD-10-CM | POA: Diagnosis not present

## 2024-05-15 DIAGNOSIS — I10 Essential (primary) hypertension: Secondary | ICD-10-CM | POA: Diagnosis not present

## 2024-05-15 DIAGNOSIS — E119 Type 2 diabetes mellitus without complications: Secondary | ICD-10-CM | POA: Diagnosis not present

## 2024-05-17 DIAGNOSIS — M25551 Pain in right hip: Secondary | ICD-10-CM | POA: Diagnosis not present

## 2024-05-23 DIAGNOSIS — N3281 Overactive bladder: Secondary | ICD-10-CM | POA: Diagnosis not present

## 2024-05-23 DIAGNOSIS — R350 Frequency of micturition: Secondary | ICD-10-CM | POA: Diagnosis not present

## 2024-05-23 DIAGNOSIS — R351 Nocturia: Secondary | ICD-10-CM | POA: Diagnosis not present

## 2024-06-06 DIAGNOSIS — N39490 Overflow incontinence: Secondary | ICD-10-CM | POA: Diagnosis not present

## 2024-06-06 DIAGNOSIS — N3281 Overactive bladder: Secondary | ICD-10-CM | POA: Diagnosis not present

## 2024-06-11 DIAGNOSIS — E119 Type 2 diabetes mellitus without complications: Secondary | ICD-10-CM | POA: Diagnosis not present

## 2024-06-11 DIAGNOSIS — E7800 Pure hypercholesterolemia, unspecified: Secondary | ICD-10-CM | POA: Diagnosis not present

## 2024-06-11 DIAGNOSIS — I10 Essential (primary) hypertension: Secondary | ICD-10-CM | POA: Diagnosis not present

## 2024-06-28 DIAGNOSIS — F331 Major depressive disorder, recurrent, moderate: Secondary | ICD-10-CM | POA: Diagnosis not present

## 2024-06-28 DIAGNOSIS — R3914 Feeling of incomplete bladder emptying: Secondary | ICD-10-CM | POA: Diagnosis not present
